# Patient Record
Sex: Female | Born: 1970 | Race: White | Hispanic: No | Marital: Married | Smoking: Former smoker
Health system: Southern US, Community
[De-identification: ages and names within clinical notes are randomized; demographics above are authoritative.]

## PROBLEM LIST (undated history)

## (undated) DIAGNOSIS — Z170 Estrogen receptor positive status [ER+]: Principal | ICD-10-CM

## (undated) DIAGNOSIS — F419 Anxiety disorder, unspecified: Secondary | ICD-10-CM

## (undated) DIAGNOSIS — I10 Essential (primary) hypertension: Secondary | ICD-10-CM

## (undated) DIAGNOSIS — K5790 Diverticulosis of intestine, part unspecified, without perforation or abscess without bleeding: Secondary | ICD-10-CM

## (undated) DIAGNOSIS — J329 Chronic sinusitis, unspecified: Secondary | ICD-10-CM

## (undated) DIAGNOSIS — C50412 Malignant neoplasm of upper-outer quadrant of left female breast: Secondary | ICD-10-CM

## (undated) HISTORY — DX: Diverticulosis of intestine, part unspecified, without perforation or abscess without bleeding: K57.90

## (undated) HISTORY — DX: Essential (primary) hypertension: I10

## (undated) HISTORY — DX: Estrogen receptor positive status (ER+): Z17.0

## (undated) HISTORY — DX: Chronic sinusitis, unspecified: J32.9

## (undated) HISTORY — PX: BREAST SURGERY: SHX581

## (undated) HISTORY — DX: Malignant neoplasm of upper-outer quadrant of left female breast: C50.412

## (undated) HISTORY — PX: DILATION AND CURETTAGE OF UTERUS: SHX78

## (undated) HISTORY — PX: LASIK: SHX215

---

## 2008-05-09 ENCOUNTER — Ambulatory Visit: Payer: Self-pay | Admitting: Family Medicine

## 2008-05-09 ENCOUNTER — Encounter: Payer: Self-pay | Admitting: Family Medicine

## 2008-05-09 ENCOUNTER — Other Ambulatory Visit: Admission: RE | Admit: 2008-05-09 | Discharge: 2008-05-09 | Payer: Self-pay | Admitting: Family Medicine

## 2008-05-09 DIAGNOSIS — J309 Allergic rhinitis, unspecified: Secondary | ICD-10-CM | POA: Insufficient documentation

## 2008-05-09 LAB — CONVERTED CEMR LAB: Pap Smear: NORMAL

## 2008-05-30 ENCOUNTER — Encounter: Payer: Self-pay | Admitting: Family Medicine

## 2008-05-31 LAB — CONVERTED CEMR LAB
BUN: 14 mg/dL (ref 6–23)
Chloride: 106 meq/L (ref 96–112)
Creatinine, Ser: 0.76 mg/dL (ref 0.40–1.20)
Glucose, Bld: 96 mg/dL (ref 70–99)
Potassium: 4.4 meq/L (ref 3.5–5.3)
Sodium: 140 meq/L (ref 135–145)
Total Bilirubin: 0.7 mg/dL (ref 0.3–1.2)
VLDL: 11 mg/dL (ref 0–40)

## 2009-01-17 ENCOUNTER — Encounter: Payer: Self-pay | Admitting: Family Medicine

## 2009-03-12 ENCOUNTER — Ambulatory Visit: Payer: Self-pay | Admitting: Family Medicine

## 2009-03-12 DIAGNOSIS — M79609 Pain in unspecified limb: Secondary | ICD-10-CM | POA: Insufficient documentation

## 2010-02-24 ENCOUNTER — Ambulatory Visit: Payer: Self-pay | Admitting: Family Medicine

## 2010-03-19 ENCOUNTER — Ambulatory Visit: Payer: Self-pay | Admitting: Family

## 2010-03-19 DIAGNOSIS — L02229 Furuncle of trunk, unspecified: Secondary | ICD-10-CM

## 2010-04-06 ENCOUNTER — Ambulatory Visit: Payer: Self-pay | Admitting: Family Medicine

## 2010-04-06 ENCOUNTER — Other Ambulatory Visit: Admission: RE | Admit: 2010-04-06 | Discharge: 2010-04-06 | Payer: Self-pay | Admitting: Family Medicine

## 2010-04-06 DIAGNOSIS — L219 Seborrheic dermatitis, unspecified: Secondary | ICD-10-CM | POA: Insufficient documentation

## 2010-04-06 DIAGNOSIS — R519 Headache, unspecified: Secondary | ICD-10-CM | POA: Insufficient documentation

## 2010-04-06 DIAGNOSIS — R51 Headache: Secondary | ICD-10-CM | POA: Insufficient documentation

## 2010-04-08 ENCOUNTER — Encounter: Admission: RE | Admit: 2010-04-08 | Discharge: 2010-04-08 | Payer: Self-pay | Admitting: Family Medicine

## 2010-04-14 ENCOUNTER — Encounter: Admission: RE | Admit: 2010-04-14 | Discharge: 2010-04-14 | Payer: Self-pay | Admitting: Family Medicine

## 2010-09-27 ENCOUNTER — Encounter: Payer: Self-pay | Admitting: Family Medicine

## 2010-10-06 NOTE — Assessment & Plan Note (Signed)
Summary: CPE   Vital Signs:  Patient profile:   40 year old female Height:      66.5 inches Weight:      190 pounds BMI:     30.32 Pulse rate:   60 / minute BP sitting:   106 / 68  (left arm) Cuff size:   regular  Vitals Entered By: Avon Gully CMA, Duncan Dull) (April 06, 2010 11:15 AM) CC: CPE and PAP   Primary Care Provider:  Nani Gasser MD  CC:  CPE and PAP.  History of Present Illness: Doing well overall. Runs several miles a day for exercise.  Gets s sensitivity over her forearms with running. usually goes away within an hour after stopping. No rash or redness. Wants to know if normal.  Also c/o of ear rining in the left ear. Has had ringin on and off over her lifetime but this has been louder andmore consistant over the last 2 months.  Happening weekely. Denies any hearing loss or ear pain.  Also notes has been getting HA over her left forehead for about 14month.  She describes thies as a pressure sensation and thought it could be from her sinuses and allergies.  No othe neurologic sxs like numbness, etc. No known triggers.   Used to get a cream for the skin on her nose and wants to know if I can rx something. No itching or burning. Comes and goes.   Current Medications (verified): 1)  Flonase 50 Mcg/act Susp (Fluticasone Propionate) .... 2 Sprays in Each Nostril Once Daily  Allergies (verified): 1)  ! Sulfa  Comments:  Nurse/Medical Assistant: The patient's medications and allergies were reviewed with the patient and were updated in the Medication and Allergy Lists. Avon Gully CMA, Duncan Dull) (April 06, 2010 11:16 AM)   Past History:  Past Medical History: Hx of corneal ulcer from wearing contacts Hx of genital warts.  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   Past Surgical History: D&C 1997 Lasik surgery.   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 Married to [place] with 3 kids.  Husband with hx of brain tumor.   Review of Systems  The patient denies anorexia, fever, weight loss, weight gain, vision loss, decreased hearing, hoarseness, chest pain, syncope, dyspnea on exertion, peripheral edema, prolonged cough, headaches, hemoptysis, abdominal pain, melena, hematochezia, severe indigestion/heartburn, hematuria, incontinence, genital sores, muscle weakness, suspicious skin lesions, transient blindness, difficulty walking, depression, unusual weight change, abnormal bleeding, enlarged lymph nodes, and breast masses.         Left ear is rining about one a week.   Physical Exam  General:  Well-developed,well-nourished,in no acute distress; alert,appropriate and cooperative throughout examination Head:  Normocephalic and atraumatic without obvious abnormalities. No apparent alopecia or balding. Eyes:  No corneal or conjunctival inflammation noted. EOMI. Perrla.  Ears:  Narrow ear canals. Scar on the left TM.  O/W clear.  Nose:  External nasal examination shows no deformity or inflammation.  Mouth:  Oral mucosa and oropharynx without lesions or exudates.  Teeth in good repair. Neck:  No deformities, masses, or tenderness noted. Chest Wall:  No deformities, masses, or tenderness noted. Breasts:  No mass, nodules, thickening, tenderness, bulging, retraction, inflamation, nipple discharge or skin changes noted.  Vertical ridge of breast  tissue above the nipple on the right breast.  Lungs:  Normal respiratory effort, chest expands symmetrically. Lungs are clear to auscultation, no crackles or wheezes. Heart:  Normal rate and regular rhythm. S1 and S2 normal without gallop, murmur, click, rub or other extra sounds. Abdomen:  Bowel sounds positive,abdomen soft and non-tender without masses, organomegaly or  hernias noted. Genitalia:  Normal introitus for age, no external lesions, no vaginal discharge, mucosa pink and moist, no vaginal or cervical lesions, no vaginal atrophy, no friaility or hemorrhage, normal uterus size and position, no adnexal masses or tenderness Pulses:  R and L carotid,radial,,dorsalis pedis and posterior tibial pulses are full and equal bilaterally Extremities:  No clubbing, cyanosis, edema, or deformity noted with normal full range of motion of all joints.   Neurologic:  No cranial nerve deficits noted. Station and gait are normal.  Sensory, motor and coordinative functions appear intact. Skin:  Dry flaking skin along the nasal fold.  Cervical Nodes:  No lymphadenopathy noted Axillary Nodes:  No palpable lymphadenopathy Psych:  Cognition and judgment appear intact. Alert and cooperative with normal attention span and concentration. No apparent delusions, illusions, hallucinations   Impression & Recommendations:  Problem # 1:  EXAMINATION, ROUTINE MEDICAL (ICD-V70.0) reminded her to get her mammogram Will f/u pap results. Due for screenig labs.  She exercises regularly.  Vaccines are up to date.   Problem # 2:  HEADACHE (ICD-784.0) Dsicussed that in combo with the ear ringing that this is concerning.  Dsicussed options. Agreed to follow for one month. If not better in one month will get head CT since her CN exam is normal ans she has not experienced any hering loss.  If worsens then f/u sooner and will scan sooner.   Problem # 3:  DERMATITIS, SEBORRHEIC (ICD-690.10)  Complete Medication List: 1)  Flonase 50 Mcg/act Susp (Fluticasone propionate) .... 2 sprays in each nostril once daily 2)  Triamcinolone Acetonide 0.1 % Crea (Triamcinolone acetonide) .... Apply once daily to affected area on face.  Patient Instructions: 1)  Call [phone] to schedule your mammogram.  2)  We will call you with your lab results.  3)  keep up the exercise!!   Prescriptions: TRIAMCINOLONE  ACETONIDE 0.1 % CREA (TRIAMCINOLONE ACETONIDE) Apply once daily to affected area on face.  #15 grams. x 0   Entered and Authorized by:   Nani Gasser MD   Signed by:   Nani Gasser MD on 04/06/2010   Method used:   Electronically to        [facility] [phone]* (retail)       [address]       [place], [place]  [address]       Ph: [phone] or [phone]       Fax: [phone]   RxID:   [id number]

## 2010-10-06 NOTE — Assessment & Plan Note (Signed)
Summary: ? MRSA at the bottom of stomach-jr   Vital Signs:  Patient profile:   40 year old female Height:      66.5 inches Weight:      186 pounds BMI:     29.68 Temp:     97.8 degrees F oral Pulse rate:   67 / minute BP sitting:   108 / 69  (left arm) Cuff size:   regular  Vitals Entered By: Kathlene November (March 19, 2010 1:33 PM) CC: sore on lower left side of pelvic area ? MRSA for 1 week   Primary Care Provider:  Nani Gasser MD  CC:  sore on lower left side of pelvic area ? MRSA for 1 week.  History of Present Illness: Shannon May is a 40 year old female who presents with a 10 week history of boil on the left lower abdomen.  She has tried using neosporin and applying bandages without improvement.  Reports that several years ago she had a MRSA boil on the back of her leg.  Denies associated drainage.  Current Medications (verified): 1)  None  Allergies (verified): 1)  ! Sulfa  Comments:  Nurse/Medical Assistant: The patient's medications and allergies were reviewed with the patient and were updated in the Medication and Allergy Lists. Kathlene November (March 19, 2010 1:33 PM)  Physical Exam  General:  Well-developed,well-nourished,in no acute distress; alert,appropriate and cooperative throughout examination Skin:  less then one centimeter lesion on left lower abdomen which is open, no significant erythema or drainage. Psych:  Cognition and judgment appear intact. Alert and cooperative with normal attention span and concentration. No apparent delusions, illusions, hallucinations   Impression & Recommendations:  Problem # 1:  CARBUNCLE AND FURUNCLE OF TRUNK (ICD-680.2) Assessment New Small lesion- clinically doubt MRSA.  Will give trial of Keflex.  Pt was instructed to call if symptoms worsen or do not improve- could consider doxy at that point.    Complete Medication List: 1)  Keflex 500 Mg Caps (Cephalexin) .... Two tablets by mouth two times a day x 7  days  Patient Instructions: 1)  Call if symptoms worsen or do not improve. 2)  Have a nice trip. Prescriptions: KEFLEX 500 MG CAPS (CEPHALEXIN) two tablets by mouth two times a day x 7 days  #14 x 0   Entered and Authorized by:   Lemont Fillers FNP   Signed by:   Lemont Fillers FNP on 03/19/2010   Method used:   Electronically to        [facility] [phone]* (retail)       [address]       [place], [place]  [address]       Ph: [phone] or [phone]       Fax: [phone]   RxID:   [id number]

## 2010-10-07 ENCOUNTER — Encounter: Payer: Self-pay | Admitting: Family Medicine

## 2010-10-21 ENCOUNTER — Encounter: Payer: Self-pay | Admitting: Family Medicine

## 2010-10-22 NOTE — Letter (Addendum)
Summary: [facility]   Imported By: Maryln Gottron 10/15/2010 10:38:49  _____________________________________________________________________  External Attachment:    Type:   Image     Comment:   External Document  Appended Document: [facility]   Preventive Care Screening  Colonoscopy:    Date:  10/21/2010    Next Due:  10/2015    Results:  normal Performed at [facility] [place]

## 2010-11-12 NOTE — Procedures (Signed)
Summary: Colonoscopy/[facility]  Colonoscopy/[facility]   Imported By: Lanelle Bal 11/05/2010 14:12:10  _____________________________________________________________________  External Attachment:    Type:   Image     Comment:   External Document

## 2012-08-09 ENCOUNTER — Encounter: Payer: Self-pay | Admitting: Physician Assistant

## 2012-08-09 ENCOUNTER — Ambulatory Visit (INDEPENDENT_AMBULATORY_CARE_PROVIDER_SITE_OTHER): Payer: BC Managed Care – PPO | Admitting: Physician Assistant

## 2012-08-09 VITALS — BP 109/67 | HR 75 | Temp 98.2°F | Ht 65.5 in | Wt 186.0 lb

## 2012-08-09 DIAGNOSIS — Z23 Encounter for immunization: Secondary | ICD-10-CM

## 2012-08-09 DIAGNOSIS — H8110 Benign paroxysmal vertigo, unspecified ear: Secondary | ICD-10-CM

## 2012-08-09 DIAGNOSIS — J309 Allergic rhinitis, unspecified: Secondary | ICD-10-CM

## 2012-08-09 DIAGNOSIS — R300 Dysuria: Secondary | ICD-10-CM

## 2012-08-09 DIAGNOSIS — N390 Urinary tract infection, site not specified: Secondary | ICD-10-CM

## 2012-08-09 DIAGNOSIS — R8290 Unspecified abnormal findings in urine: Secondary | ICD-10-CM

## 2012-08-09 LAB — POCT URINALYSIS DIPSTICK
Blood, UA: NEGATIVE
Nitrite, UA: POSITIVE
Spec Grav, UA: 1.02
Urobilinogen, UA: 4
pH, UA: 5

## 2012-08-09 MED ORDER — FLUTICASONE PROPIONATE 50 MCG/ACT NA SUSP: 2.0000 | Freq: Every day | NASAL | Status: DC

## 2012-08-09 MED ORDER — LORATADINE 10 MG PO TABS: 10.0000 mg | ORAL_TABLET | Freq: Every day | ORAL | Status: DC

## 2012-08-09 MED ORDER — NITROFURANTOIN MONOHYD MACRO 100 MG PO CAPS: ORAL_CAPSULE | ORAL | Status: DC

## 2012-08-09 MED ORDER — CIPROFLOXACIN HCL 500 MG PO TABS: 500.0000 mg | ORAL_TABLET | Freq: Two times a day (BID) | ORAL | Status: DC

## 2012-08-09 NOTE — Patient Instructions (Addendum)
Antivert for dizziness.   Urinary Tract Infection Urinary tract infections (UTIs) can develop anywhere along your urinary tract. Your urinary tract is your body's drainage system for removing wastes and extra water. Your urinary tract includes two kidneys, two ureters, a bladder, and a urethra. Your kidneys are a pair of bean-shaped organs. Each kidney is about the size of your fist. They are located below your ribs, one on each side of your spine. CAUSES Infections are caused by microbes, which are microscopic organisms, including fungi, viruses, and bacteria. These organisms are so small that they can only be seen through a microscope. Bacteria are the microbes that most commonly cause UTIs. SYMPTOMS  Symptoms of UTIs may vary by age and gender of the patient and by the location of the infection. Symptoms in young women typically include a frequent and intense urge to urinate and a painful, burning feeling in the bladder or urethra during urination. Older women and men are more likely to be tired, shaky, and weak and have muscle aches and abdominal pain. A fever may mean the infection is in your kidneys. Other symptoms of a kidney infection include pain in your back or sides below the ribs, nausea, and vomiting. DIAGNOSIS To diagnose a UTI, your caregiver will ask you about your symptoms. Your caregiver also will ask to provide a urine sample. The urine sample will be tested for bacteria and white blood cells. White blood cells are made by your body to help fight infection. TREATMENT  Typically, UTIs can be treated with medication. Because most UTIs are caused by a bacterial infection, they usually can be treated with the use of antibiotics. The choice of antibiotic and length of treatment depend on your symptoms and the type of bacteria causing your infection. HOME CARE INSTRUCTIONS  If you were prescribed antibiotics, take them exactly as your caregiver instructs you. Finish the medication even if  you feel better after you have only taken some of the medication.  Drink enough water and fluids to keep your urine clear or pale yellow.  Avoid caffeine, tea, and carbonated beverages. They tend to irritate your bladder.  Empty your bladder often. Avoid holding urine for long periods of time.  Empty your bladder before and after sexual intercourse.  After a bowel movement, women should cleanse from front to back. Use each tissue only once. SEEK MEDICAL CARE IF:   You have back pain.  You develop a fever.  Your symptoms do not begin to resolve within 3 days. SEEK IMMEDIATE MEDICAL CARE IF:   You have severe back pain or lower abdominal pain.  You develop chills.  You have nausea or vomiting.  You have continued burning or discomfort with urination. MAKE SURE YOU:   Understand these instructions.  Will watch your condition.  Will get help right away if you are not doing well or get worse. Document Released: 06/02/2005 Document Revised: 02/22/2012 Document Reviewed: 10/01/2011 [facility] Patient Information 2013 [place], [place].

## 2012-08-09 NOTE — Progress Notes (Signed)
Subjective:    Patient ID: Shannon May, female    DOB: 1970/10/11, 41 y.o.   MRN: [id number]  HPI Patient is a 41 yo female who presents to the clinic with dysuria, urinary frequency, and urgency starting 3 days ago. She has been really busy working and traveling and has not been able to get in. She has been taking Azo for the last 3 days and has helped. She has also been drinking water and cranberry juice. Despite all symptomatic care symptoms have worsened and she work up crying last night. She has also been taking ibuprofen for pain relief. Denies any fever but has felt warm. She has had some right sided back pain. Denies any noticeable blood in urine but does describe some mucus like discharge. She has had a history of recurrent UTI's and previously treated with macrobid post-coital. That did seem to help keep infections at bay. She has been evaluated by Urology and cannot find any structual reason for UTI's.   She has also had some problems with episodic dizziness. She had her first episode in November where she woke up she tried to stand and could not because she felt like the room was spinning. She has had 2 or 3 since then. They last no more than 2 hours. She has not tried anything to make better. Moving positions makes worse. She has not been taking Flonase and Claritin and she used to take everyday. She has a history of bad allergies.   Review of Systems     Objective:   Physical Exam  Constitutional: She is oriented to person, place, and time. She appears well-developed and well-nourished.  HENT:  Head: Normocephalic and atraumatic.  Right Ear: External ear normal.  Left Ear: External ear normal.  Mouth/Throat: Oropharynx is clear and moist. No oropharyngeal exudate.       TM's clear bilaterally.    Negative maxillary and frontal sinus tenderness.   Bilateral turbinates red and swollen.  Eyes: Conjunctivae normal are normal.  Neck: Normal range of motion. Neck supple.   Cardiovascular: Normal rate, regular rhythm and normal heart sounds.   Pulmonary/Chest: Effort normal and breath sounds normal.       NO CVA tenderness.  Abdominal: Soft. Bowel sounds are normal. There is no tenderness.  Lymphadenopathy:    She has no cervical adenopathy.  Neurological: She is alert and oriented to person, place, and time.  Skin: Skin is warm and dry.  Psychiatric: She has a normal mood and affect. Her behavior is normal.          Assessment & Plan:  UTI- UA was positive for nitrates, leukocytes, glucose, protein, ketones, and bilirubin. She had taken Azo which can mess up urine. Suggest UA recheck when sees Dr. Linford Arnold later this month to follow up on the more concerning positives or glucose and bilirubin in urine. Sent urine for culture. Went ahead and treated with Cipro for 3 days. Due to hx of recurrent UTI's and per patient this being the 4th one this year went ahead and put on post-coital Macrobid for prevention. Symptomatic care given for UTI. Call if not improving. Continuing azo for pain control.    BPV- Reassured pt that dizziness sounds like BPV. Instructed patient to go back on Claritin/Flonase regimen and to pick up Antivert OTC. See if that helps symptoms when she has episode. I was not able to reproduce symptoms with Dix-Hallipike today since asymptomatic. Discussed epley maneuver if symptoms last more than 2 hours.  Will follow up with scheduled appt with Dr. Linford Arnold in 2 weeks.    Flu shot given today with no complications.

## 2012-08-12 LAB — URINE CULTURE: Colony Count: >=100000

## 2012-09-01 ENCOUNTER — Encounter: Payer: Self-pay | Admitting: Family Medicine

## 2012-09-01 ENCOUNTER — Ambulatory Visit (INDEPENDENT_AMBULATORY_CARE_PROVIDER_SITE_OTHER): Payer: BC Managed Care – PPO | Admitting: Family Medicine

## 2012-09-01 ENCOUNTER — Other Ambulatory Visit: Payer: Self-pay | Admitting: Family Medicine

## 2012-09-01 ENCOUNTER — Other Ambulatory Visit (HOSPITAL_COMMUNITY)
Admission: RE | Admit: 2012-09-01 | Discharge: 2012-09-01 | Disposition: A | Discharge status: Home / Self Care | Payer: BC Managed Care – PPO | Source: Ambulatory Visit | Attending: Family Medicine | Admitting: Family Medicine

## 2012-09-01 VITALS — BP 107/65 | HR 64 | Resp 18 | Ht 66.75 in | Wt 185.0 lb

## 2012-09-01 DIAGNOSIS — Z01419 Encounter for gynecological examination (general) (routine) without abnormal findings: Secondary | ICD-10-CM | POA: Insufficient documentation

## 2012-09-01 DIAGNOSIS — J329 Chronic sinusitis, unspecified: Secondary | ICD-10-CM

## 2012-09-01 DIAGNOSIS — N920 Excessive and frequent menstruation with regular cycle: Secondary | ICD-10-CM

## 2012-09-01 DIAGNOSIS — M79609 Pain in unspecified limb: Secondary | ICD-10-CM

## 2012-09-01 DIAGNOSIS — F17200 Nicotine dependence, unspecified, uncomplicated: Secondary | ICD-10-CM

## 2012-09-01 DIAGNOSIS — M79673 Pain in unspecified foot: Secondary | ICD-10-CM

## 2012-09-01 DIAGNOSIS — Z0000 Encounter for general adult medical examination without abnormal findings: Secondary | ICD-10-CM

## 2012-09-01 MED ORDER — AMOXICILLIN 875 MG PO TABS: 875.0000 mg | ORAL_TABLET | Freq: Two times a day (BID) | ORAL | Status: DC

## 2012-09-01 NOTE — Patient Instructions (Addendum)
Keep up a regular exercise program and make sure you are eating a healthy diet Try to eat 4 servings of dairy a day, or if you are lactose intolerant take a calcium with vitamin D daily.  Your vaccines are up to date.   

## 2012-09-01 NOTE — Progress Notes (Signed)
Subjective:     Shannon May is a 41 y.o. female and is here for a comprehensive physical exam. The patient reports problems - bilateral heel pain in the morning. Gets better as the day progresses. never takes meds for it.  Used to run but says has backed offf and it has gotten better. She does not use medications for the pain or discomfort.  She also complains of sinus symptoms for 7 days. She said she had sinus congestion, cough with congestion in her chest. She has mild sore throat. She has significant ear pain and pressure. She said she felt so bad on Christmas day that she took some antibiotics that were offered by her mother. She says her with amoxicillin 500 mg. She has taken this for the last 2 days and says she is finally starting to feel better. She denies feeling febrile. She felt extremely fatigued. No significant myalgias. She says the cough and symptoms were worse at night.  She also complains of heavy periods over the last 2 years. She says they have been getting worse. They only last about 5 days but she's been passing very large clots that looked dark. She's also noticed spotting when she exercises which is also new for her. She denies any significant pelvic pain or pressure or discomfort.  History   Social History  . Marital Status: Married    Spouse Name: N/A    Number of Children: N/A  . Years of Education: N/A   Occupational History  . School Teacher    Social History Main Topics  . Smoking status: Current Some Day Smoker    Types: Cigarettes  . Smokeless tobacco: Not on file  . Alcohol Use: 1.8 oz/week    3 Glasses of wine per week  . Drug Use: Not on file  . Sexually Active: Yes -- Female partner(s)   Other Topics Concern  . Not on file   Social History Narrative   [employer] [employer] for exercise.  Used to run for exerise.    Health Maintenance  Topic Date Due  . Pap Smear  03/22/1987  . Influenza Vaccine  05/07/2013  . Tetanus/tdap  05/09/2018    The  following portions of the patient's history were reviewed and updated as appropriate: allergies, current medications, past family history, past medical history, past social history, past surgical history and problem list.  Review of Systems A comprehensive review of systems was negative.   Objective:    BP 107/65  Pulse 64  Resp 18  Ht 5' 6.75" (1.695 m)  Wt 185 lb (83.915 kg)  BMI 29.19 kg/m2  SpO2 98%  LMP 07/23/2012 General appearance: alert, cooperative and appears stated age Head: Normocephalic, without obvious abnormality, atraumatic Eyes: conj clear, EOMi, PEERLA Ears: normal TM's and external ear canals both ears Nose: Nares normal. Septum midline. Mucosa normal. No drainage or sinus tenderness. Throat: lips, mucosa, and tongue normal; teeth and gums normal Neck: no adenopathy, no carotid bruit, no JVD, supple, symmetrical, trachea midline and thyroid not enlarged, symmetric, no tenderness/mass/nodules Back: symmetric, no curvature. ROM normal. No CVA tenderness. Lungs: clear to auscultation bilaterally Breasts: normal appearance, no masses or tenderness Heart: regular rate and rhythm, S1, S2 normal, no murmur, click, rub or gallop Abdomen: soft, non-tender; bowel sounds normal; no masses,  no organomegaly Pelvic: cervix normal in appearance, external genitalia normal, no adnexal masses or tenderness, no cervical motion tenderness, rectovaginal septum normal, uterus normal size, shape, and consistency and vagina normal without  discharge Extremities: extremities normal, atraumatic, no cyanosis or edema Pulses: 2+ and symmetric Skin: Skin color, texture, turgor normal. No rashes or lesions Lymph nodes: Cervical, supraclavicular, and axillary nodes normal. Neurologic: Grossly normal    Assessment:    Healthy female exam.      Plan:     See After Visit Summary for Counseling Recommendations  Keep up a regular exercise program and make sure you are eating a healthy  diet Try to eat 4 servings of dairy a day, or if you are lactose intolerant take a calcium with vitamin D daily.  Your vaccines are up to date.  Go for screening labs.  Sinusitis - since she already started a course of antibiotics and it's difficult to tell if she really needs to have been on these from the beginning I will go ahead and send over a prescription so that she can finish the course that she has started. Sent amoxicillin 875 mg twice a day. Call if not significantly or completely better in one week.   Menorrhagia - discussed I would like to set her up for a pelvic ultrasound for further evaluation in addition to a endometrial biopsy with GYN since she is over the age of 43 and this is a new finding for her.  Check thyroid to rule out hormonal abnormality  Bilateral heel pain-I suspect plantar fasciitis or possible heel spur based on the description of her symptoms. Handout given her exercises to do on her own at home. After couple weeks of the knees I encouraged her to start running again and slowly work her way up. If she is still having significant pain or discomfort then I recommend she followup with my partner Dr. Rodney Langton for possible injections and maybe night splints. Avoid wearing shoes like high heels. Make sure that they are supportive.

## 2012-09-01 NOTE — Addendum Note (Signed)
Addended by: Chalmers Cater on: 09/01/2012 01:27 PM   Modules accepted: Orders

## 2012-09-04 ENCOUNTER — Ambulatory Visit (HOSPITAL_BASED_OUTPATIENT_CLINIC_OR_DEPARTMENT_OTHER)
Admission: RE | Admit: 2012-09-04 | Discharge: 2012-09-04 | Disposition: A | Discharge status: Home / Self Care | Payer: BC Managed Care – PPO | Source: Ambulatory Visit | Attending: Family Medicine | Admitting: Family Medicine

## 2012-09-04 DIAGNOSIS — N920 Excessive and frequent menstruation with regular cycle: Secondary | ICD-10-CM | POA: Insufficient documentation

## 2012-09-04 DIAGNOSIS — F17200 Nicotine dependence, unspecified, uncomplicated: Secondary | ICD-10-CM

## 2012-09-04 LAB — COMPLETE METABOLIC PANEL WITH GFR
ALT: 13 U/L (ref 0–35)
Albumin: 4.3 g/dL (ref 3.5–5.2)
Creat: 0.61 mg/dL (ref 0.50–1.10)
GFR, Est Non African American: >89 mL/min
Glucose, Bld: 90 mg/dL (ref 70–99)
Sodium: 137 mEq/L (ref 135–145)
Total Bilirubin: 0.5 mg/dL (ref 0.3–1.2)

## 2012-09-04 LAB — CBC WITH DIFFERENTIAL/PLATELET
Basophils Absolute: 0 10*3/uL (ref 0.0–0.1)
HCT: 39.7 % (ref 36.0–46.0)
Hemoglobin: 13.8 g/dL (ref 12.0–15.0)
Lymphs Abs: 1.8 10*3/uL (ref 0.7–4.0)
MCHC: 34.8 g/dL (ref 30.0–36.0)
Neutrophils Relative %: 62 % (ref 43–77)
Platelets: 301 10*3/uL (ref 150–400)
RBC: 4.3 MIL/uL (ref 3.87–5.11)
RDW: 13 % (ref 11.5–15.5)
WBC: 6.5 10*3/uL (ref 4.0–10.5)

## 2012-09-04 LAB — LIPID PANEL
Total CHOL/HDL Ratio: 3.5 Ratio
Triglycerides: 60 mg/dL (ref <150)

## 2012-09-04 LAB — TSH: TSH: 2.642 u[IU]/mL (ref 0.350–4.500)

## 2012-09-07 ENCOUNTER — Other Ambulatory Visit: Payer: BC Managed Care – PPO

## 2012-09-08 ENCOUNTER — Encounter: Payer: Self-pay | Admitting: Family Medicine

## 2012-11-30 ENCOUNTER — Encounter: Payer: Self-pay | Admitting: Sports Medicine

## 2012-11-30 ENCOUNTER — Ambulatory Visit (INDEPENDENT_AMBULATORY_CARE_PROVIDER_SITE_OTHER): Payer: BC Managed Care – PPO | Admitting: Sports Medicine

## 2012-11-30 VITALS — BP 124/66 | HR 76 | Wt 190.0 lb

## 2012-11-30 DIAGNOSIS — M75100 Unspecified rotator cuff tear or rupture of unspecified shoulder, not specified as traumatic: Secondary | ICD-10-CM

## 2012-11-30 DIAGNOSIS — B070 Plantar wart: Secondary | ICD-10-CM

## 2012-11-30 DIAGNOSIS — M7552 Bursitis of left shoulder: Secondary | ICD-10-CM

## 2012-11-30 DIAGNOSIS — IMO0002 Reserved for concepts with insufficient information to code with codable children: Secondary | ICD-10-CM

## 2012-11-30 DIAGNOSIS — M7550 Bursitis of unspecified shoulder: Secondary | ICD-10-CM | POA: Insufficient documentation

## 2012-11-30 MED ORDER — MELOXICAM 15 MG PO TABS: ORAL_TABLET | ORAL | Status: DC

## 2012-11-30 NOTE — Assessment & Plan Note (Signed)
Cryotherapy performed. We may need to repeat this.

## 2012-11-30 NOTE — Assessment & Plan Note (Signed)
Ultrasound guided injection as above. Mobic. Home exercises. Return in 4 weeks.

## 2012-11-30 NOTE — Progress Notes (Signed)
   Subjective:    CC: Shoulder and foot  HPI: Left foot: This young lady has noted a nodule on the lateral aspect of her fourth toe. She denies any trauma and notes that it has been there for several weeks. It is not painful, she is going on vacation soon and needed to be done.  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 Pain is localized, doesn't radiate, moderate. No trauma.  Past medical history, Surgical history, Family history not pertinant except as noted below, Social history, Allergies, and medications have been entered into the medical record, reviewed, and no changes needed.   Review of Systems: No headache, visual changes, nausea, vomiting, diarrhea, constipation, dizziness, abdominal pain, skin rash, fevers, chills, night sweats, weight loss, swollen lymph nodes, body aches, joint swelling, muscle aches, chest pain, shortness of breath, mood changes, visual or auditory hallucinations.   Objective:   General: Well Developed, well nourished, and in no acute distress.  Neuro/Psych: Alert and oriented x3, extra-ocular muscles intact, able to move all 4 extremities, sensation grossly intact. Skin: Warm and dry, no rashes noted.  1 cm verruca versus callous noted on lateral aspect of fourth toe on the left foot. Respiratory: Not using accessory muscles, speaking in full sentences, trachea midline.  Cardiovascular: Pulses palpable, no extremity edema. Abdomen: Does not appear distended. Left Shoulder: Inspection reveals no abnormalities, atrophy or asymmetry. Palpation is normal with no tenderness over AC joint or bicipital groove. ROM is limited in abduction. Rotator cuff strength weak to abduction. Positive  Neer and Hawkin's tests, empty can sign. Speeds and Yergason's tests normal. No labral pathology noted with negative Obrien's, negative clunk and good stability. Normal scapular function observed. No painful arc and no drop arm sign. No apprehension sign  Procedure: Real-time Ultrasound Guided Injection of left subacromial bursa Device: GE Logiq E  Ultrasound guided injection is preferred based studies that show increased duration, increased effect, greater accuracy, decreased procedural pain, increased response rate, and decreased cost with ultrasound guided versus blind injection.  Verbal informed consent obtained.  Time-out conducted.  Noted no overlying erythema, induration, or other signs of local infection.  Skin prepped in a sterile fashion.  Local anesthesia: Topical Ethyl chloride.  With sterile technique and under real time ultrasound guidance:  25-gauge 1/2 inch needle advanced into subacromial bursa, 1 cc Kenalog 40, 4 cc lidocaine injected easily. Supraspinatus appeared intact. Completed without difficulty  Pain immediately resolved suggesting accurate placement of the medication.  Advised to call if fevers/chills, erythema, induration, drainage, or persistent bleeding.  Images permanently stored and available for review in the ultrasound unit.  Impression: Technically successful ultrasound guided injection.  Cryotherapy performed on verruca on foot.  Impression and Recommendations:   This case required medical decision making of moderate complexity.

## 2012-12-12 ENCOUNTER — Ambulatory Visit (INDEPENDENT_AMBULATORY_CARE_PROVIDER_SITE_OTHER): Payer: BC Managed Care – PPO | Admitting: Family Medicine

## 2012-12-12 ENCOUNTER — Encounter: Payer: Self-pay | Admitting: Family Medicine

## 2012-12-12 VITALS — BP 123/79 | HR 64 | Ht 66.75 in | Wt 186.0 lb

## 2012-12-12 DIAGNOSIS — B070 Plantar wart: Secondary | ICD-10-CM

## 2012-12-12 NOTE — Progress Notes (Signed)
CC: Shannon May is a 42 y.o. female is here for wart on toe   Subjective: HPI:  Patient complains of left fourth toe pain. Described as a sharp sensation the lateral edge of the toe. Localized to a region where callus has been forming for months now. Symptoms were slightly improved after cryotherapy approximately 2 weeks ago. Pain is described as mild. Nothing else is made better or worse other than walking on it seems to be more annoying. No interventions other than above. Denies foot pain otherwise, joint swelling nor redness, trouble bearing weight on the left foot, fevers, chills, nor swollen lymph nodes   Review Of Systems Outlined In HPI  No past medical history on file.   Family History  Problem Relation Age of Onset  . Colon cancer Maternal Grandfather   . Melanoma Paternal Grandfather   . Heart attack Maternal Grandmother   . Hypertension Mother   . Hyperlipidemia Father      History  Substance Use Topics  . Smoking status: Current Some Day Smoker    Types: Cigarettes  . Smokeless tobacco: Not on file  . Alcohol Use: 1.8 oz/week    3 Glasses of wine per week     Objective: Filed Vitals:   12/12/12 1135  BP: 123/79  Pulse: 64    Vital signs reviewed. General: Alert and Oriented, No Acute Distress HEENT: Pupils equal, round, reactive to light. Conjunctivae clear.  External ears unremarkable.  Moist mucous membranes. Lungs: Clear and comfortable work of breathing, speaking in full sentences without accessory muscle use. Cardiac: Regular rate and rhythm.  Neuro: CN II-XII grossly intact, gait normal. Extremities: No peripheral edema.  Strong peripheral pulses. On the left fourth lateral digit of the foot there is a well healed and callused over wart with central punctate bruising all of which approximately 5 mm in diameter. Mental Status: No depression, anxiety, nor agitation. Logical though process. Skin: Warm and dry.  Assessment & Plan: Shannon May was seen  today for wart on toe.  Diagnoses and associated orders for this visit:  Verruca pedis    Discuss continued management of her wart, she would prefer repeat cryotherapy. The callus formation over the wart was easily shaved off with a 10 blade by myself to aid with freezing.  Cryotherapy Procedure Note  Pre-operative Diagnosis: verruca pedis  Post-operative Diagnosis: same  Locations: left foot lateral 4th digit  Indications: pain  Anesthesia: none  Procedure Details  History of allergy to iodine: no. Pacemaker? no.  Patient informed of risks (permanent scarring, infection, light or dark discoloration, bleeding, infection, weakness, numbness and recurrence of the lesion) and benefits of the procedure and verbal informed consent obtained.  The areas are treated with liquid nitrogen therapy, frozen until ice ball extended 3 mm beyond lesion, allowed to thaw, and treated again. The patient tolerated procedure well.  The patient was instructed on post-op care, warned that there may be blister formation, redness and pain. Recommend OTC analgesia as needed for pain.  Condition: Stable  Complications: none.  Plan: 1. Instructed to keep the area dry and covered for 24-48h and clean thereafter. 2. Warning signs of infection were reviewed.   3. Recommended that the patient use OTC analgesics as needed for pain.     Return if symptoms worsen or fail to improve.

## 2013-01-01 ENCOUNTER — Ambulatory Visit (INDEPENDENT_AMBULATORY_CARE_PROVIDER_SITE_OTHER): Payer: BC Managed Care – PPO | Admitting: Sports Medicine

## 2013-01-01 VITALS — BP 112/74 | HR 65

## 2013-01-01 DIAGNOSIS — M7552 Bursitis of left shoulder: Secondary | ICD-10-CM

## 2013-01-01 DIAGNOSIS — IMO0002 Reserved for concepts with insufficient information to code with codable children: Secondary | ICD-10-CM

## 2013-01-01 DIAGNOSIS — M75100 Unspecified rotator cuff tear or rupture of unspecified shoulder, not specified as traumatic: Secondary | ICD-10-CM

## 2013-01-01 NOTE — Progress Notes (Signed)
  Subjective:    CC: Followup  HPI: Rotator cuff syndrome: Resolved after injection, she was able to conduct the orchestra perfectly.  Toe lesion: Resolved after a couple of cryotherapy sessions.  Past medical history, Surgical history, Family history not pertinant except as noted below, Social history, Allergies, and medications have been entered into the medical record, reviewed, and no changes needed.   Review of Systems: No fevers, chills, night sweats, weight loss, chest pain, or shortness of breath.   Objective:    General: Well Developed, well nourished, and in no acute distress.  Neuro: Alert and oriented x3, extra-ocular muscles intact, sensation grossly intact.  HEENT: Normocephalic, atraumatic, pupils equal round reactive to light, neck supple, no masses, no lymphadenopathy, thyroid nonpalpable.  Skin: Warm and dry, no rashes. Cardiac: Regular rate and rhythm, no murmurs rubs or gallops, no lower extremity edema.  Respiratory: Clear to auscultation bilaterally. Not using accessory muscles, speaking in full sentences. Left Shoulder: Inspection reveals no abnormalities, atrophy or asymmetry. Palpation is normal with no tenderness over AC joint or bicipital groove. ROM is full in all planes. Rotator cuff strength normal throughout. No signs of impingement with negative Neer and Hawkin's tests, empty can sign. Speeds and Yergason's tests normal. No labral pathology noted with negative Obrien's, negative clunk and good stability. Normal scapular function observed. No painful arc and no drop arm sign. No apprehension sign  Impression and Recommendations:

## 2013-01-01 NOTE — Assessment & Plan Note (Signed)
Resolved, continue exercises.

## 2013-06-20 ENCOUNTER — Encounter: Payer: Self-pay | Admitting: Physician Assistant

## 2013-06-20 ENCOUNTER — Ambulatory Visit (INDEPENDENT_AMBULATORY_CARE_PROVIDER_SITE_OTHER): Payer: BC Managed Care – PPO | Admitting: Physician Assistant

## 2013-06-20 VITALS — BP 131/89 | HR 66 | Wt 197.0 lb

## 2013-06-20 DIAGNOSIS — R8290 Unspecified abnormal findings in urine: Secondary | ICD-10-CM

## 2013-06-20 DIAGNOSIS — R3915 Urgency of urination: Secondary | ICD-10-CM

## 2013-06-20 DIAGNOSIS — R82998 Other abnormal findings in urine: Secondary | ICD-10-CM

## 2013-06-20 DIAGNOSIS — N390 Urinary tract infection, site not specified: Secondary | ICD-10-CM

## 2013-06-20 LAB — POCT URINALYSIS DIPSTICK
Glucose, UA: NEGATIVE
Ketones, UA: NEGATIVE
Nitrite, UA: POSITIVE
Spec Grav, UA: <=1.005

## 2013-06-20 MED ORDER — CIPROFLOXACIN HCL 500 MG PO TABS: 500.0000 mg | ORAL_TABLET | Freq: Two times a day (BID) | ORAL | Status: DC

## 2013-06-20 NOTE — Patient Instructions (Signed)
Urinary Tract Infection  Urinary tract infections (UTIs) can develop anywhere along your urinary tract. Your urinary tract is your body's drainage system for removing wastes and extra water. Your urinary tract includes two kidneys, two ureters, a bladder, and a urethra. Your kidneys are a pair of bean-shaped organs. Each kidney is about the size of your fist. They are located below your ribs, one on each side of your spine.  CAUSES  Infections are caused by microbes, which are microscopic organisms, including fungi, viruses, and bacteria. These organisms are so small that they can only be seen through a microscope. Bacteria are the microbes that most commonly cause UTIs.  SYMPTOMS   Symptoms of UTIs may vary by age and gender of the patient and by the location of the infection. Symptoms in young women typically include a frequent and intense urge to urinate and a painful, burning feeling in the bladder or urethra during urination. Older women and men are more likely to be tired, shaky, and weak and have muscle aches and abdominal pain. A fever may mean the infection is in your kidneys. Other symptoms of a kidney infection include pain in your back or sides below the ribs, nausea, and vomiting.  DIAGNOSIS  To diagnose a UTI, your caregiver will ask you about your symptoms. Your caregiver also will ask to provide a urine sample. The urine sample will be tested for bacteria and white blood cells. White blood cells are made by your body to help fight infection.  TREATMENT   Typically, UTIs can be treated with medication. Because most UTIs are caused by a bacterial infection, they usually can be treated with the use of antibiotics. The choice of antibiotic and length of treatment depend on your symptoms and the type of bacteria causing your infection.  HOME CARE INSTRUCTIONS   If you were prescribed antibiotics, take them exactly as your caregiver instructs you. Finish the medication even if you feel better after you  have only taken some of the medication.   Drink enough water and fluids to keep your urine clear or pale yellow.   Avoid caffeine, tea, and carbonated beverages. They tend to irritate your bladder.   Empty your bladder often. Avoid holding urine for long periods of time.   Empty your bladder before and after sexual intercourse.   After a bowel movement, women should cleanse from front to back. Use each tissue only once.  SEEK MEDICAL CARE IF:    You have back pain.   You develop a fever.   Your symptoms do not begin to resolve within 3 days.  SEEK IMMEDIATE MEDICAL CARE IF:    You have severe back pain or lower abdominal pain.   You develop chills.   You have nausea or vomiting.   You have continued burning or discomfort with urination.  MAKE SURE YOU:    Understand these instructions.   Will watch your condition.   Will get help right away if you are not doing well or get worse.  Document Released: 06/02/2005 Document Revised: 02/22/2012 Document Reviewed: 10/01/2011  [employer] Patient Information 2014 [employer].

## 2013-06-20 NOTE — Progress Notes (Signed)
  Subjective:    Patient ID: Shannon May, female    DOB: 1970-11-17, 42 y.o.   MRN: [id number]  HPI Patient is a 42 year old female who presents to the clinic with urinary urgency, urinary frequency and abdominal pressure. Her urine has had a bad odor for the last 4 days. She has been taking AZO which has helped significantly. She denies any fever but does feel a little sweaty and clammy. She admits to not taking her Macrobid after sexual intercourse for the last month.     Review of Systems     Objective:   Physical Exam  Constitutional: She is oriented to person, place, and time. She appears well-developed and well-nourished.  HENT:  Head: Normocephalic and atraumatic.  Cardiovascular: Normal rate, regular rhythm and normal heart sounds.   Pulmonary/Chest: Effort normal and breath sounds normal. She has no wheezes.  No CVA tenderness  Abdominal: Soft. There is no tenderness.  Neurological: She is alert and oriented to person, place, and time.  Skin: Skin is warm and dry.  Psychiatric: She has a normal mood and affect. Her behavior is normal.          Assessment & Plan:  UTI-UA positive for blood, leukocytes, nitrates. Cipro was given for the next 3 days. Handout for symptomatic control was given. If symptoms suddenly worsen or do not improve call office.

## 2013-07-24 ENCOUNTER — Encounter: Payer: Self-pay | Admitting: Sports Medicine

## 2013-07-24 ENCOUNTER — Ambulatory Visit (INDEPENDENT_AMBULATORY_CARE_PROVIDER_SITE_OTHER): Payer: BC Managed Care – PPO | Admitting: Sports Medicine

## 2013-07-24 VITALS — BP 121/71 | HR 67 | Wt 198.0 lb

## 2013-07-24 DIAGNOSIS — M7552 Bursitis of left shoulder: Secondary | ICD-10-CM

## 2013-07-24 DIAGNOSIS — IMO0002 Reserved for concepts with insufficient information to code with codable children: Secondary | ICD-10-CM

## 2013-07-24 DIAGNOSIS — M75100 Unspecified rotator cuff tear or rupture of unspecified shoulder, not specified as traumatic: Secondary | ICD-10-CM

## 2013-07-24 NOTE — Assessment & Plan Note (Signed)
Excellent response to initial subacromial injection. Injection repeated today. Return as needed.

## 2013-07-24 NOTE — Progress Notes (Signed)
  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 Last subacromial injection was in March, she had a six-month response, now has recurrent pain over the deltoid, worse with overhead activities, radiation, moderate, persistent.  Past medical history, Surgical history, Family history not pertinant except as noted below, Social history, Allergies, and medications have been entered into the medical record, reviewed, and no changes needed.   Review of Systems: No fevers, chills, night sweats, weight loss, chest pain, or shortness of breath.   Objective:    General: Well Developed, well nourished, and in no acute distress.  Neuro: Alert and oriented x3, extra-ocular muscles intact, sensation grossly intact.  HEENT: Normocephalic, atraumatic, pupils equal round reactive to light, neck supple, no masses, no lymphadenopathy, thyroid nonpalpable.  Skin: Warm and dry, no rashes. Cardiac: Regular rate and rhythm, no murmurs rubs or gallops, no lower extremity edema.  Respiratory: Clear to auscultation bilaterally. Not using accessory muscles, speaking in full sentences. Left Shoulder: Inspection reveals no abnormalities, atrophy or asymmetry. Palpation is normal with no tenderness over AC joint or bicipital groove. ROM is full in all planes. Rotator cuff strength normal throughout. Positive Neer and Hawkin's tests, empty can sign. Speeds and Yergason's tests normal. No labral pathology noted with negative Obrien's, negative clunk and good stability. Normal scapular function observed. No painful arc and no drop arm sign. No apprehension sign  Procedure: Real-time Ultrasound Guided Injection of left subacromial bursa Device: GE Logiq E  Verbal informed consent obtained.  Time-out conducted.  Noted no overlying erythema, induration, or other signs of local infection.  Skin prepped in a sterile fashion.  Local anesthesia:  Topical Ethyl chloride.  With sterile technique and under real time ultrasound guidance:  25-gauge needle advanced into the bursa, 1 cc Kenalog 40, 3 cc lidocaine injected easily. Completed without difficulty  Pain immediately resolved suggesting accurate placement of the medication.  Advised to call if fevers/chills, erythema, induration, drainage, or persistent bleeding.  Images permanently stored and available for review in the ultrasound unit.  Impression: Technically successful ultrasound guided injection.  Impression and Recommendations:

## 2013-10-15 ENCOUNTER — Ambulatory Visit (INDEPENDENT_AMBULATORY_CARE_PROVIDER_SITE_OTHER): Payer: BC Managed Care – PPO | Admitting: Physician Assistant

## 2013-10-15 ENCOUNTER — Encounter: Payer: Self-pay | Admitting: Physician Assistant

## 2013-10-15 VITALS — BP 146/76 | HR 73 | Temp 97.9°F | Wt 204.0 lb

## 2013-10-15 DIAGNOSIS — J029 Acute pharyngitis, unspecified: Secondary | ICD-10-CM

## 2013-10-15 DIAGNOSIS — R509 Fever, unspecified: Secondary | ICD-10-CM

## 2013-10-15 LAB — POCT RAPID STREP A (OFFICE): RAPID STREP A SCREEN: NEGATIVE

## 2013-10-15 MED ORDER — METHYLPREDNISOLONE (PAK) 4 MG PO TABS
4.0000 mg | ORAL_TABLET | Freq: Every day | ORAL | Status: DC
Start: 2013-10-15 — End: 2014-01-23

## 2013-10-15 NOTE — Patient Instructions (Signed)
Sore Throat A sore throat is pain, burning, irritation, or scratchiness of the throat. There is often pain or tenderness when swallowing or talking. A sore throat may be accompanied by other symptoms, such as coughing, sneezing, fever, and swollen neck glands. A sore throat is often the first sign of another sickness, such as a cold, flu, strep throat, or mononucleosis (commonly known as mono). Most sore throats go away without medical treatment. CAUSES  The most common causes of a sore throat include:  A viral infection, such as a cold, flu, or mono.  A bacterial infection, such as strep throat, tonsillitis, or whooping cough.  Seasonal allergies.  Dryness in the air.  Irritants, such as smoke or pollution.  Gastroesophageal reflux disease (GERD). HOME CARE INSTRUCTIONS   Only take over-the-counter medicines as directed by your caregiver.  Drink enough fluids to keep your urine clear or pale yellow.  Rest as needed.  Try using throat sprays, lozenges, or sucking on hard candy to ease any pain (if older than 4 years or as directed).  Sip warm liquids, such as broth, herbal tea, or warm water with honey to relieve pain temporarily. You may also eat or drink cold or frozen liquids such as frozen ice pops.  Gargle with salt water (mix 1 tsp salt with 8 oz of water).  Do not smoke and avoid secondhand smoke.  Put a cool-mist humidifier in your bedroom at night to moisten the air. You can also turn on a hot shower and sit in the bathroom with the door closed for 5 10 minutes. SEEK IMMEDIATE MEDICAL CARE IF:  You have difficulty breathing.  You are unable to swallow fluids, soft foods, or your saliva.  You have increased swelling in the throat.  Your sore throat does not get better in 7 days.  You have nausea and vomiting.  You have a fever or persistent symptoms for more than 2 3 days.  You have a fever and your symptoms suddenly get worse. MAKE SURE YOU:   Understand  these instructions.  Will watch your condition.  Will get help right away if you are not doing well or get worse. Document Released: 09/30/2004 Document Revised: 08/09/2012 Document Reviewed: 04/30/2012 [employer] Patient Information 2014 [employer].  

## 2013-10-15 NOTE — Progress Notes (Signed)
   Subjective:    Patient ID: Shannon May, female    DOB: 07/30/1971, 43 y.o.   MRN: [id number]  HPI Pt presents to the clinic with 4 days of fever, cough, body aches and ST. She never took her fever just felt hot and cold off and on. 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 Her lungs feel like they are burning but denies any wheezing. Some coughing with production of clear sputum. ST is raw. Denies any nausea or vomiting. Tried dayquil, ibuprofen, mucinex with some relief.  No one else in the home is sick.    Review of Systems     Objective:   Physical Exam  Constitutional: She is oriented to person, place, and time. She appears well-developed and well-nourished.  HENT:  Head: Normocephalic and atraumatic.  Right Ear: External ear normal.  Left Ear: External ear normal.  Nose: Nose normal.  TM"s clear bilaterally.   Oropharynx erythematous. No exudate or swollen tonsils.   No maxillary or frontal sinus tenderness to palpation.   Eyes: Conjunctivae are normal.  Neck: Normal range of motion. Neck supple.  Cardiovascular: Normal rate, regular rhythm and normal heart sounds.   Pulmonary/Chest: Effort normal and breath sounds normal. She has no wheezes.  Lymphadenopathy:    She has no cervical adenopathy.  Neurological: She is alert and oriented to person, place, and time.  Skin: Skin is dry.  Psychiatric: She has a normal mood and affect. Her behavior is normal.          Assessment & Plan:  Acute pharyngitis- Rapid strep negative. Influnenza negative.  Discussed likely viral with short duration and negative strep. Will send over medrol dose pack to help with inflammation and symptoms. Gave HO. Discussed symptomatic control. 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 Call if not improving or worsening.

## 2013-10-18 ENCOUNTER — Telehealth: Payer: Self-pay | Admitting: *Deleted

## 2013-10-18 NOTE — Telephone Encounter (Signed)
Pt left vm stating that she has now begun to cough up really thick mucous & thinks she may need abx now.

## 2013-10-19 ENCOUNTER — Other Ambulatory Visit: Payer: Self-pay | Admitting: Physician Assistant

## 2013-10-19 MED ORDER — AZITHROMYCIN 250 MG PO TABS: ORAL_TABLET | ORAL | Status: DC

## 2013-10-19 NOTE — Telephone Encounter (Signed)
Pt notified of rx. 

## 2013-10-19 NOTE — Telephone Encounter (Signed)
Sent zpak to [facility].

## 2013-11-06 ENCOUNTER — Ambulatory Visit (INDEPENDENT_AMBULATORY_CARE_PROVIDER_SITE_OTHER): Payer: BC Managed Care – PPO | Admitting: Sports Medicine

## 2013-11-06 ENCOUNTER — Encounter: Payer: Self-pay | Admitting: Sports Medicine

## 2013-11-06 VITALS — BP 134/86 | HR 85 | Ht 67.0 in | Wt 206.0 lb

## 2013-11-06 DIAGNOSIS — IMO0002 Reserved for concepts with insufficient information to code with codable children: Secondary | ICD-10-CM

## 2013-11-06 DIAGNOSIS — M75100 Unspecified rotator cuff tear or rupture of unspecified shoulder, not specified as traumatic: Secondary | ICD-10-CM

## 2013-11-06 DIAGNOSIS — M7550 Bursitis of unspecified shoulder: Secondary | ICD-10-CM

## 2013-11-06 NOTE — Assessment & Plan Note (Addendum)
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 Return as needed.

## 2013-11-06 NOTE — Progress Notes (Addendum)
  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 Pain is starting to recur, and she has a large choir to conduct coming up. Mild, persistent, localized over left deltoid, worse with overhead activities.  Past medical history, Surgical history, Family history not pertinant except as noted below, Social history, Allergies, and medications have been entered into the medical record, reviewed, and no changes needed.   Review of Systems: No fevers, chills, night sweats, weight loss, chest pain, or shortness of breath.   Objective:    General: Well Developed, well nourished, and in no acute distress.  Neuro: Alert and oriented x3, extra-ocular muscles intact, sensation grossly intact.  HEENT: Normocephalic, atraumatic, pupils equal round reactive to light, neck supple, no masses, no lymphadenopathy, thyroid nonpalpable.  Skin: Warm and dry, no rashes. Cardiac: Regular rate and rhythm, no murmurs rubs or gallops, no lower extremity edema.  Respiratory: Clear to auscultation bilaterally. Not using accessory muscles, speaking in full sentences. Left Shoulder: Inspection reveals no abnormalities, atrophy or asymmetry. Palpation is normal with no tenderness over AC joint or bicipital groove. ROM is full in all planes. Rotator cuff strength normal throughout. Minimally positive impingement signs. Speeds and Yergason's tests normal. No labral pathology noted with negative Obrien's, negative clunk and good stability. Normal scapular function observed. No painful arc and no drop arm sign. No apprehension sign  Procedure: Real-time Ultrasound Guided Injection of left subacromial bursa Device: GE Logiq E  Verbal informed consent obtained.  Time-out conducted.  Noted no overlying erythema, induration, or other signs of local infection.    Skin prepped in a sterile fashion.  Local anesthesia: Topical Ethyl chloride.  With sterile technique and under real time ultrasound guidance:  0.5 cc Kenalog 40, 4 cc lidocaine injected easily into the cervical bursa. Completed without difficulty  Pain immediately resolved suggesting accurate placement of the medication.  Advised to call if fevers/chills, erythema, induration, drainage, or persistent bleeding.  Images permanently stored and available for review in the ultrasound unit.  Impression: Technically successful ultrasound guided injection.  Impression and Recommendations:

## 2014-01-23 ENCOUNTER — Encounter: Payer: Self-pay | Admitting: Physician Assistant

## 2014-01-23 ENCOUNTER — Telehealth: Payer: Self-pay

## 2014-01-23 ENCOUNTER — Ambulatory Visit (INDEPENDENT_AMBULATORY_CARE_PROVIDER_SITE_OTHER): Payer: BC Managed Care – PPO | Admitting: Physician Assistant

## 2014-01-23 VITALS — BP 126/82 | HR 79 | Ht 67.0 in | Wt 205.0 lb

## 2014-01-23 DIAGNOSIS — M75100 Unspecified rotator cuff tear or rupture of unspecified shoulder, not specified as traumatic: Secondary | ICD-10-CM

## 2014-01-23 DIAGNOSIS — IMO0002 Reserved for concepts with insufficient information to code with codable children: Secondary | ICD-10-CM

## 2014-01-23 DIAGNOSIS — M7550 Bursitis of unspecified shoulder: Secondary | ICD-10-CM

## 2014-01-23 NOTE — Telephone Encounter (Signed)
Shannon May called to report shoulder pain has returned. I have scheduled patient for office visit.

## 2014-01-23 NOTE — Progress Notes (Signed)
   Subjective:    Patient ID: Shannon May, female    DOB: [birth year], 43 y.o.   MRN: [id number]  HPI Pt presents to the clinic with left shoulder pain from diagnosed bursitis. Dr. Darene Lamer is currently treating pt. Last injection was in march and help for 2 months significantly but about 1 month ago pain started back. 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 mobic helps but not been taking everyday due to worry about hurting her kidneys. 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 Most movement hurts arm. Her ROM is limited due to pain. She would like to be written out for a period of rest for next couple of days. If she works there is just no way she cannot use her arms.    Review of Systems     Objective:   Physical Exam  Constitutional: She appears well-developed and well-nourished.  Cardiovascular: Normal rate, regular rhythm and normal heart sounds.   Musculoskeletal:  Not able to abduct left arm above 90 degrees due to pain.   Psychiatric: She has a normal mood and affect. Her behavior is normal.          Assessment & Plan:  Left shoulder bursitis- discussed with her to schedule appt for perhaps another injection with Dr. Darene Lamer next week. I think it is reasonable to write her out for Thursday and Friday of this week and with long weekend should give her ample time to rest her arms. Encouraged since mobic helps to take daily while resting. Continue home stretches that have been given. Ice if needed.

## 2014-02-04 ENCOUNTER — Other Ambulatory Visit: Payer: Self-pay | Admitting: Sports Medicine

## 2014-02-05 ENCOUNTER — Ambulatory Visit: Payer: BC Managed Care – PPO | Admitting: Sports Medicine

## 2014-02-05 DIAGNOSIS — Z0289 Encounter for other administrative examinations: Secondary | ICD-10-CM

## 2014-02-20 ENCOUNTER — Ambulatory Visit (INDEPENDENT_AMBULATORY_CARE_PROVIDER_SITE_OTHER): Payer: BC Managed Care – PPO | Admitting: Sports Medicine

## 2014-02-20 ENCOUNTER — Encounter: Payer: Self-pay | Admitting: Sports Medicine

## 2014-02-20 VITALS — BP 119/81 | HR 70 | Ht 67.5 in | Wt 203.0 lb

## 2014-02-20 DIAGNOSIS — IMO0002 Reserved for concepts with insufficient information to code with codable children: Secondary | ICD-10-CM

## 2014-02-20 DIAGNOSIS — M7550 Bursitis of unspecified shoulder: Secondary | ICD-10-CM

## 2014-02-20 DIAGNOSIS — M75100 Unspecified rotator cuff tear or rupture of unspecified shoulder, not specified as traumatic: Secondary | ICD-10-CM

## 2014-02-20 MED ORDER — NITROGLYCERIN 0.2 MG/HR TD PT24: MEDICATED_PATCH | TRANSDERMAL | Status: DC

## 2014-02-20 NOTE — Progress Notes (Signed)
  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 I injected her left subacromial bursa in March of this year. She had a good response, unfortunately temporary, with 2 months of pain relief. She's never done physical therapy, and we have never consider biologic therapy. She continues to do significant overhead activities as she directs the choir, and uses meloxicam nearly daily. Pain is moderate, persistent, localized over the deltoid.  Past medical history, Surgical history, Family history not pertinant except as noted below, Social history, Allergies, and medications have been entered into the medical record, reviewed, and no changes needed.   Review of Systems: No fevers, chills, night sweats, weight loss, chest pain, or shortness of breath.   Objective:    General: Well Developed, well nourished, and in no acute distress.  Neuro: Alert and oriented x3, extra-ocular muscles intact, sensation grossly intact.  HEENT: Normocephalic, atraumatic, pupils equal round reactive to light, neck supple, no masses, no lymphadenopathy, thyroid nonpalpable.  Skin: Warm and dry, no rashes. Cardiac: Regular rate and rhythm, no murmurs rubs or gallops, no lower extremity edema.  Respiratory: Clear to auscultation bilaterally. Not using accessory muscles, speaking in full sentences. Left Shoulder: Inspection reveals no abnormalities, atrophy or asymmetry. Palpation is normal with no tenderness over AC joint or bicipital groove. ROM is full in all planes. Rotator cuff strength normal throughout. Positive Neer and Hawkin's tests, empty can sign. Speeds and Yergason's tests normal. No labral pathology noted with negative Obrien's, negative clunk and good stability. Normal scapular function observed. No painful arc and no drop arm sign. No apprehension sign  Procedure: Real-time Ultrasound Guided Injection of left subacromial  bursa Device: GE Logiq E  Verbal informed consent obtained.  Time-out conducted.  Noted no overlying erythema, induration, or other signs of local infection.  Skin prepped in a sterile fashion.  Local anesthesia: Topical Ethyl chloride.  With sterile technique and under real time ultrasound guidance:  1 cc Kenalog 40 and 3 cc lidocaine injected easily into the subacromial bursa which did appear distended on ultrasound. Completed without difficulty  Pain immediately resolved suggesting accurate placement of the medication.  Advised to call if fevers/chills, erythema, induration, drainage, or persistent bleeding.  Images permanently stored and available for review in the ultrasound unit.  Impression: Technically successful ultrasound guided injection.  Impression and Recommendations:

## 2014-02-20 NOTE — Assessment & Plan Note (Signed)
Previous injection only lasted one to 2 months and is very active Investment banker, corporate. Repeat subacromial injection, this time adding physical therapy and topical nitroglycerin as biologic therapy. Return to see me in 3 months. For 3 months

## 2014-03-01 ENCOUNTER — Ambulatory Visit (INDEPENDENT_AMBULATORY_CARE_PROVIDER_SITE_OTHER): Payer: BC Managed Care – PPO | Admitting: Physical Therapy

## 2014-03-01 DIAGNOSIS — M25519 Pain in unspecified shoulder: Secondary | ICD-10-CM

## 2014-03-01 DIAGNOSIS — IMO0002 Reserved for concepts with insufficient information to code with codable children: Secondary | ICD-10-CM

## 2014-03-01 DIAGNOSIS — R609 Edema, unspecified: Secondary | ICD-10-CM

## 2014-03-01 DIAGNOSIS — M25619 Stiffness of unspecified shoulder, not elsewhere classified: Secondary | ICD-10-CM

## 2014-03-01 DIAGNOSIS — M75100 Unspecified rotator cuff tear or rupture of unspecified shoulder, not specified as traumatic: Secondary | ICD-10-CM

## 2014-03-01 DIAGNOSIS — M6281 Muscle weakness (generalized): Secondary | ICD-10-CM

## 2014-03-13 ENCOUNTER — Encounter (INDEPENDENT_AMBULATORY_CARE_PROVIDER_SITE_OTHER): Payer: BC Managed Care – PPO | Admitting: Physical Therapy

## 2014-03-13 DIAGNOSIS — R609 Edema, unspecified: Secondary | ICD-10-CM

## 2014-03-13 DIAGNOSIS — M6281 Muscle weakness (generalized): Secondary | ICD-10-CM

## 2014-03-13 DIAGNOSIS — IMO0002 Reserved for concepts with insufficient information to code with codable children: Secondary | ICD-10-CM

## 2014-03-13 DIAGNOSIS — M25519 Pain in unspecified shoulder: Secondary | ICD-10-CM

## 2014-03-13 DIAGNOSIS — M75100 Unspecified rotator cuff tear or rupture of unspecified shoulder, not specified as traumatic: Secondary | ICD-10-CM

## 2014-03-13 DIAGNOSIS — M25619 Stiffness of unspecified shoulder, not elsewhere classified: Secondary | ICD-10-CM

## 2014-03-15 ENCOUNTER — Encounter (INDEPENDENT_AMBULATORY_CARE_PROVIDER_SITE_OTHER): Payer: BC Managed Care – PPO | Admitting: Physical Therapy

## 2014-03-15 DIAGNOSIS — M6281 Muscle weakness (generalized): Secondary | ICD-10-CM

## 2014-03-15 DIAGNOSIS — M25619 Stiffness of unspecified shoulder, not elsewhere classified: Secondary | ICD-10-CM

## 2014-03-15 DIAGNOSIS — M75100 Unspecified rotator cuff tear or rupture of unspecified shoulder, not specified as traumatic: Secondary | ICD-10-CM

## 2014-03-15 DIAGNOSIS — IMO0002 Reserved for concepts with insufficient information to code with codable children: Secondary | ICD-10-CM

## 2014-03-15 DIAGNOSIS — R609 Edema, unspecified: Secondary | ICD-10-CM

## 2014-03-15 DIAGNOSIS — M25519 Pain in unspecified shoulder: Secondary | ICD-10-CM

## 2014-03-19 ENCOUNTER — Encounter (INDEPENDENT_AMBULATORY_CARE_PROVIDER_SITE_OTHER): Payer: BC Managed Care – PPO | Admitting: Physical Therapy

## 2014-03-19 DIAGNOSIS — IMO0002 Reserved for concepts with insufficient information to code with codable children: Secondary | ICD-10-CM

## 2014-03-19 DIAGNOSIS — M6281 Muscle weakness (generalized): Secondary | ICD-10-CM

## 2014-03-19 DIAGNOSIS — M25619 Stiffness of unspecified shoulder, not elsewhere classified: Secondary | ICD-10-CM

## 2014-03-19 DIAGNOSIS — M25519 Pain in unspecified shoulder: Secondary | ICD-10-CM

## 2014-03-19 DIAGNOSIS — R609 Edema, unspecified: Secondary | ICD-10-CM

## 2014-03-19 DIAGNOSIS — M75100 Unspecified rotator cuff tear or rupture of unspecified shoulder, not specified as traumatic: Secondary | ICD-10-CM

## 2014-03-21 ENCOUNTER — Encounter: Payer: BC Managed Care – PPO | Admitting: Physical Therapy

## 2014-03-25 ENCOUNTER — Encounter (INDEPENDENT_AMBULATORY_CARE_PROVIDER_SITE_OTHER): Payer: BC Managed Care – PPO | Admitting: Physical Therapy

## 2014-03-25 DIAGNOSIS — M25619 Stiffness of unspecified shoulder, not elsewhere classified: Secondary | ICD-10-CM

## 2014-03-25 DIAGNOSIS — M6281 Muscle weakness (generalized): Secondary | ICD-10-CM

## 2014-03-25 DIAGNOSIS — R609 Edema, unspecified: Secondary | ICD-10-CM

## 2014-03-25 DIAGNOSIS — IMO0002 Reserved for concepts with insufficient information to code with codable children: Secondary | ICD-10-CM

## 2014-03-25 DIAGNOSIS — M75100 Unspecified rotator cuff tear or rupture of unspecified shoulder, not specified as traumatic: Secondary | ICD-10-CM

## 2014-03-25 DIAGNOSIS — M25519 Pain in unspecified shoulder: Secondary | ICD-10-CM

## 2014-03-26 ENCOUNTER — Encounter: Payer: BC Managed Care – PPO | Admitting: Physical Therapy

## 2014-04-09 ENCOUNTER — Encounter (INDEPENDENT_AMBULATORY_CARE_PROVIDER_SITE_OTHER): Payer: BC Managed Care – PPO | Admitting: Physical Therapy

## 2014-04-09 DIAGNOSIS — M25619 Stiffness of unspecified shoulder, not elsewhere classified: Secondary | ICD-10-CM

## 2014-04-09 DIAGNOSIS — IMO0002 Reserved for concepts with insufficient information to code with codable children: Secondary | ICD-10-CM

## 2014-04-09 DIAGNOSIS — M75100 Unspecified rotator cuff tear or rupture of unspecified shoulder, not specified as traumatic: Secondary | ICD-10-CM

## 2014-04-09 DIAGNOSIS — R609 Edema, unspecified: Secondary | ICD-10-CM

## 2014-04-09 DIAGNOSIS — M25519 Pain in unspecified shoulder: Secondary | ICD-10-CM

## 2014-04-09 DIAGNOSIS — M6281 Muscle weakness (generalized): Secondary | ICD-10-CM

## 2014-04-24 ENCOUNTER — Encounter: Payer: Self-pay | Admitting: Physician Assistant

## 2014-04-24 ENCOUNTER — Ambulatory Visit (INDEPENDENT_AMBULATORY_CARE_PROVIDER_SITE_OTHER): Payer: BC Managed Care – PPO | Admitting: Physician Assistant

## 2014-04-24 ENCOUNTER — Other Ambulatory Visit: Payer: Self-pay | Admitting: Physician Assistant

## 2014-04-24 VITALS — BP 120/73 | HR 83 | Wt 202.0 lb

## 2014-04-24 DIAGNOSIS — J029 Acute pharyngitis, unspecified: Secondary | ICD-10-CM | POA: Diagnosis not present

## 2014-04-24 DIAGNOSIS — J069 Acute upper respiratory infection, unspecified: Secondary | ICD-10-CM

## 2014-04-24 LAB — POCT RAPID STREP A (OFFICE): Rapid Strep A Screen: NEGATIVE

## 2014-04-24 MED ORDER — METHYLPREDNISOLONE (PAK) 4 MG PO TABS: ORAL_TABLET | ORAL | Status: DC

## 2014-04-24 MED ORDER — AMOXICILLIN 500 MG PO CAPS: 500.0000 mg | ORAL_CAPSULE | Freq: Two times a day (BID) | ORAL | Status: DC

## 2014-04-24 NOTE — Progress Notes (Signed)
   Subjective:    Patient ID: Shannon May, female    DOB: 08-Aug-1971, 43 y.o.   MRN: [id number]  HPI Pt presents to the clinic with 2 days of ST, ear fullness, and sinus pressure. She cannot tolerated decongestant but has been using mucinex which as helped. 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 Her room had a odd odor and felt wet when she went in this week. She denies any fever, chills, n/v/d. Her ST seems to be getting worse.    Review of Systems  All other systems reviewed and are negative.      Objective:   Physical Exam  Constitutional: She is oriented to person, place, and time. She appears well-developed and well-nourished.  HENT:  Head: Normocephalic and atraumatic.  Right Ear: External ear normal.  Left Ear: External ear normal.  Nose: Nose normal.  Mouth/Throat: Oropharynx is clear and moist. No oropharyngeal exudate.  TM's appear to have fluid behind them. No dullness, redness, blood or pus.   Right sided sinus tenderness to palpation.   Nasal turbinates red and swollen.   Eyes:  Bilateral injected conjunctiva with watery discharge.   Neck: Normal range of motion. Neck supple.  Bilateral cervical adenopathy.   Cardiovascular: Normal rate, regular rhythm and normal heart sounds.   Pulmonary/Chest: Effort normal and breath sounds normal. She has no wheezes.  Neurological: She is alert and oriented to person, place, and time.  Skin: Skin is dry.  Psychiatric: She has a normal mood and affect. Her behavior is normal.          Assessment & Plan:  URI/ST- rapid strep negative. Discussed with patient likely viral/allergic. Start medrol dose pack and continue mucinex twice daily. Start using flonase daily again. If not improving or if worsening in 3-4 days start amoxil. HO given. Ibuprofen for ST as well as gargle with salt water.

## 2014-04-24 NOTE — Patient Instructions (Signed)
Flonase daily.  Consider going back on claritin.  Prednisone pack to start.  Mucinex twice a day.   Augmentin if needed.   Upper Respiratory Infection, Adult An upper respiratory infection (URI) is also known as the common cold. It is often caused by a type of germ (virus). Colds are easily spread (contagious). You can pass it to others by kissing, coughing, sneezing, or drinking out of the same glass. Usually, you get better in 1 or 2 weeks.  HOME CARE   Only take medicine as told by your doctor.  Use a warm mist humidifier or breathe in steam from a hot shower.  Drink enough water and fluids to keep your pee (urine) clear or pale yellow.  Get plenty of rest.  Return to work when your temperature is back to normal or as told by your doctor. You may use a face mask and wash your hands to stop your cold from spreading. GET HELP RIGHT AWAY IF:   After the first few days, you feel you are getting worse.  You have questions about your medicine.  You have chills, shortness of breath, or brown or red spit (mucus).  You have yellow or brown snot (nasal discharge) or pain in the face, especially when you bend forward.  You have a fever, puffy (swollen) neck, pain when you swallow, or white spots in the back of your throat.  You have a bad headache, ear pain, sinus pain, or chest pain.  You have a high-pitched whistling sound when you breathe in and out (wheezing).  You have a lasting cough or cough up blood.  You have sore muscles or a stiff neck. MAKE SURE YOU:   Understand these instructions.  Will watch your condition.  Will get help right away if you are not doing well or get worse. Document Released: 02/09/2008 Document Revised: 11/15/2011 Document Reviewed: 11/28/2013 [facility] Patient Information 2015 [place], [place]. This information is not intended to replace advice given to you by your health care provider. Make sure you discuss any questions you have with your health  care provider.

## 2014-05-23 ENCOUNTER — Ambulatory Visit (INDEPENDENT_AMBULATORY_CARE_PROVIDER_SITE_OTHER): Payer: BC Managed Care – PPO | Admitting: Sports Medicine

## 2014-05-23 ENCOUNTER — Encounter: Payer: Self-pay | Admitting: Sports Medicine

## 2014-05-23 VITALS — BP 120/80 | HR 82 | Ht 67.5 in | Wt 203.0 lb

## 2014-05-23 DIAGNOSIS — M75100 Unspecified rotator cuff tear or rupture of unspecified shoulder, not specified as traumatic: Secondary | ICD-10-CM

## 2014-05-23 DIAGNOSIS — M722 Plantar fascial fibromatosis: Secondary | ICD-10-CM | POA: Diagnosis not present

## 2014-05-23 DIAGNOSIS — IMO0002 Reserved for concepts with insufficient information to code with codable children: Secondary | ICD-10-CM | POA: Diagnosis not present

## 2014-05-23 DIAGNOSIS — M7552 Bursitis of left shoulder: Secondary | ICD-10-CM

## 2014-05-23 DIAGNOSIS — M5416 Radiculopathy, lumbar region: Secondary | ICD-10-CM | POA: Insufficient documentation

## 2014-05-23 MED ORDER — MAGNESIUM OXIDE 400 MG PO TABS: 800.0000 mg | ORAL_TABLET | Freq: Every day | ORAL | Status: DC

## 2014-05-23 NOTE — Assessment & Plan Note (Signed)
Low back rehabilitation. Magnesium at bedtime.

## 2014-05-23 NOTE — Assessment & Plan Note (Signed)
Home rehabilitation, return in a month for custom orthotics.

## 2014-05-23 NOTE — Assessment & Plan Note (Signed)
Resolved with physical therapy.

## 2014-05-23 NOTE — Progress Notes (Signed)
  Subjective:    CC: Followup  HPI: Subacromial bursitis: Resolved after injection and now with physical therapy doing much better.  Leg cramps: Likely radicular, pain in the back, worse with flexion.  Heel pain: worse in the mornings, moderate, persistent without radiation.  Past medical history, Surgical history, Family history not pertinant except as noted below, Social history, Allergies, and medications have been entered into the medical record, reviewed, and no changes needed.   Review of Systems: No fevers, chills, night sweats, weight loss, chest pain, or shortness of breath.   Objective:    General: Well Developed, well nourished, and in no acute distress.  Neuro: Alert and oriented x3, extra-ocular muscles intact, sensation grossly intact.  HEENT: Normocephalic, atraumatic, pupils equal round reactive to light, neck supple, no masses, no lymphadenopathy, thyroid nonpalpable.  Skin: Warm and dry, no rashes. Cardiac: Regular rate and rhythm, no murmurs rubs or gallops, no lower extremity edema.  Respiratory: Clear to auscultation bilaterally. Not using accessory muscles, speaking in full sentences.  Impression and Recommendations:

## 2014-05-29 ENCOUNTER — Ambulatory Visit (INDEPENDENT_AMBULATORY_CARE_PROVIDER_SITE_OTHER): Payer: BC Managed Care – PPO | Admitting: Family Medicine

## 2014-05-29 ENCOUNTER — Encounter: Payer: Self-pay | Admitting: Family Medicine

## 2014-05-29 VITALS — BP 116/72 | HR 63 | Temp 98.3°F | Wt 204.0 lb

## 2014-05-29 DIAGNOSIS — R300 Dysuria: Secondary | ICD-10-CM | POA: Diagnosis not present

## 2014-05-29 LAB — POCT URINALYSIS DIPSTICK
Bilirubin, UA: NEGATIVE
Glucose, UA: 100
KETONES UA: NEGATIVE
Nitrite, UA: POSITIVE
PROTEIN UA: NEGATIVE
UROBILINOGEN UA: 1
pH, UA: 5.5

## 2014-05-29 MED ORDER — CIPROFLOXACIN HCL 250 MG PO TABS: ORAL_TABLET | ORAL | Status: AC

## 2014-05-29 MED ORDER — NITROFURANTOIN MONOHYD MACRO 100 MG PO CAPS: ORAL_CAPSULE | ORAL | Status: DC

## 2014-05-29 NOTE — Progress Notes (Signed)
CC: Shannon May is a 43 y.o. female is here for Urinary Tract Infection   Subjective: HPI:  Complains of dysuria, urinary hesitancy, and fatigue for the past 48 hours worsening on a daily basis. Slightly improved with AZO and ibuprofen. Symptoms feel like prior urinary tract infections. Symptoms are present all hours of the day not interfering with sleep. Overall symptoms are mild-to-moderate in severity. She denies fevers, chills, flank pain, abdominal pain, nausea, vomiting   Review Of Systems Outlined In HPI  No past medical history on file.  Past Surgical History  Procedure Laterality Date  . Dilation and curettage of uterus     Family History  Problem Relation Age of Onset  . Colon cancer Maternal Grandfather   . Melanoma Paternal Grandfather   . Heart attack Maternal Grandmother   . Hypertension Mother   . Hyperlipidemia Father     History   Social History  . Marital Status: Married    Spouse Name: Ronalee Belts    Number of Children: 3  . 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     Social History Main Topics  . Smoking status: Current Some Day Smoker    Types: Cigarettes  . Smokeless tobacco: Not on file  . Alcohol Use: 1.8 oz/week    3 Glasses of wine per week  . Drug Use: No  . Sexual Activity: Yes    Partners: Male   Other Topics Concern  . Not on file   Social History Narrative   [employer] for exercise.  Used to run for exerise. 2 caffeine drinks per day.      Objective: BP 116/72  Pulse 63  Temp(Src) 98.3 F (36.8 C) (Oral)  Wt 204 lb (92.534 kg)  Vital signs reviewed. General: Alert and Oriented, No Acute Distress HEENT: Pupils equal, round, reactive to light. Conjunctivae clear.  External ears unremarkable.  Moist mucous membranes. Lungs: Clear and comfortable work of breathing, speaking in full sentences without accessory muscle use. Cardiac: Regular rate and rhythm.  Back: No CVA  tenderness Extremities: No peripheral edema.  Strong peripheral pulses.  Mental Status: No depression, anxiety, nor agitation. Logical though process. Skin: Warm and dry.  Assessment & Plan: Odean was seen today for urinary tract infection.  Diagnoses and associated orders for this visit:  Dysuria - Urinalysis Dipstick - Urine Culture - ciprofloxacin (CIPRO) 250 MG tablet; Take one by mouth twice a day for five days. - nitrofurantoin, macrocrystal-monohydrate, (MACROBID) 100 MG capsule; One by mouth only after intercourse.    Urinalysis with blood and nitrites suspicious for UTI therefore start Cipro based on prior culture sensitivities. She requests a refill on postcoital antibiotics and tells me she has been prescribed Macrobid by [facility] in the past. She's been out of this for a few months.   Return if symptoms worsen or fail to improve.

## 2014-06-01 LAB — URINE CULTURE

## 2014-06-23 ENCOUNTER — Emergency Department
Admission: EM | Admit: 2014-06-23 | Discharge: 2014-06-23 | Disposition: A | Discharge status: Home / Self Care | Payer: BC Managed Care – PPO | Source: Home / Self Care | Attending: Emergency Medicine | Admitting: Emergency Medicine

## 2014-06-23 ENCOUNTER — Encounter: Payer: Self-pay | Admitting: Emergency Medicine

## 2014-06-23 DIAGNOSIS — J322 Chronic ethmoidal sinusitis: Secondary | ICD-10-CM

## 2014-06-23 MED ORDER — AMOXICILLIN-POT CLAVULANATE 875-125 MG PO TABS: 1.0000 | ORAL_TABLET | Freq: Two times a day (BID) | ORAL | Status: DC

## 2014-06-23 MED ORDER — PREDNISONE 10 MG PO TABS: ORAL_TABLET | ORAL | Status: DC

## 2014-06-23 NOTE — Discharge Instructions (Signed)

## 2014-06-23 NOTE — ED Provider Notes (Signed)
CSN: [id number]     Arrival date & time 06/23/14  1225 History   First MD Initiated Contact with Patient 06/23/14 1255     Chief Complaint  Patient presents with  . Nasal Congestion  . Headache  . Otalgia  . Generalized Body Aches  . Cough  . Hoarse   (Consider location/radiation/quality/duration/timing/severity/associated sxs/prior Treatment) Patient is a 43 y.o. female presenting with ear pain and cough. The history is provided by the patient. No language interpreter was used.  Otalgia Associated symptoms: cough, headaches and sore throat   Cough Cough characteristics:  Productive Sputum characteristics:  Green Severity:  Moderate Onset quality:  Gradual Duration:  1 week Timing:  Constant Progression:  Worsening Chronicity:  New Smoker: no   Context: not sick contacts   Relieved by:  Nothing Worsened by:  Nothing tried Ineffective treatments:  None tried Associated symptoms: ear pain, headaches, sinus congestion and sore throat   Risk factors: no recent infection   Pt complains of a sinus infection and allergy problems.  Pt reports she has mold allergies.   Pt reports she now thinks she has an infection  History reviewed. No pertinent past medical history. Past Surgical History  Procedure Laterality Date  . Dilation and curettage of uterus     Family History  Problem Relation Age of Onset  . Colon cancer Maternal Grandfather   . Melanoma Paternal Grandfather   . Heart attack Maternal Grandmother   . Hypertension Mother   . Hyperlipidemia Father    History  Substance Use Topics  . Smoking status: Current Some Day Smoker    Types: Cigarettes  . Smokeless tobacco: Not on file  . Alcohol Use: 1.8 oz/week    3 Glasses of wine per week   OB History   Grav Para Term Preterm Abortions TAB SAB Ect Mult Living   5 3   2 1 1   3      Review of Systems  HENT: Positive for ear pain and sore throat.   Respiratory: Positive for cough.   Neurological: Positive for  headaches.  All other systems reviewed and are negative.   Allergies  Sudafed and Sulfonamide derivatives  Home Medications   Prior to Admission medications   Medication Sig Start Date End Date Taking? Authorizing Provider  amoxicillin-clavulanate (AUGMENTIN) 875-125 MG per tablet Take 1 tablet by mouth every 12 (twelve) hours. 06/23/14   Fransico Meadow, PA-C  fluticasone [facility]) 50 MCG/ACT nasal spray PLACE 2 SPRAYS INTO THE NOSE DAILY 04/24/14   Jade L Breeback, PA-C  magnesium oxide (MAG-OX) 400 MG tablet Take 2 tablets (800 mg total) by mouth at bedtime. 05/23/14   Silverio Decamp, MD  meloxicam (MOBIC) 15 MG tablet ONE TAB PO QAM WITH BREAKFAST FOR 2 WEEKS, THEN DAILY PRN PAIN. 02/04/14   Silverio Decamp, MD  nitrofurantoin, macrocrystal-monohydrate, (MACROBID) 100 MG capsule One by mouth only after intercourse. 05/29/14   Marcial Pacas, DO  predniSONE (DELTASONE) 10 MG tablet 6,5,4,3,2,1 taper 06/23/14   Fransico Meadow, PA-C   BP 121/83  Pulse 68  Temp(Src) 97.9 F (36.6 C) (Oral)  Resp 16  Ht 5\' 7"  (1.702 m)  Wt 198 lb (89.812 kg)  BMI 31.00 kg/m2  SpO2 98% Physical Exam  Nursing note and vitals reviewed. Constitutional: She is oriented to person, place, and time. She appears well-developed and well-nourished.  HENT:  Head: Normocephalic and atraumatic.  Right Ear: External ear normal.  Left Ear: External ear normal.  Mouth/Throat: Oropharynx is clear and moist.  Tender maxillary and frontal sinuses  Eyes: Conjunctivae and EOM are normal. Pupils are equal, round, and reactive to light.  Neck: Normal range of motion.  Cardiovascular: Normal rate, regular rhythm and normal heart sounds.   Pulmonary/Chest: Effort normal and breath sounds normal.  Abdominal: Soft. She exhibits no distension.  Musculoskeletal: Normal range of motion.  Neurological: She is alert and oriented to person, place, and time.  Skin: Skin is warm.  Psychiatric: She has a normal mood and  affect.    ED Course  Procedures (including critical care time) Labs Review Labs Reviewed - No data to display  Imaging Review No results found.   MDM   1. Ethmoid sinusitis, unspecified chronicity    Augmentin Prednisone taper See Dr. Madilyn Fireman for recheck  AVS    Fransico Meadow, PA-C 06/23/14 1330

## 2014-06-23 NOTE — ED Notes (Signed)
Reports history of 6 days nasal/sinus congestion, cough, headache, ear pain, eye pain, hoarseness. OTCs ineffective.

## 2014-06-24 NOTE — ED Provider Notes (Signed)
Medical history/examination/treatment/procedure(s) were performed by non-physician provider and as supervising physician I was immediately available for consultation/collaboration.   Jacqulyn Cane, MD 06/24/14 1020

## 2014-07-01 ENCOUNTER — Encounter: Payer: BC Managed Care – PPO | Admitting: Sports Medicine

## 2014-07-08 ENCOUNTER — Encounter: Payer: Self-pay | Admitting: Emergency Medicine

## 2014-08-20 ENCOUNTER — Encounter: Payer: Self-pay | Admitting: Physician Assistant

## 2014-08-20 ENCOUNTER — Ambulatory Visit (INDEPENDENT_AMBULATORY_CARE_PROVIDER_SITE_OTHER): Payer: BC Managed Care – PPO | Admitting: Physician Assistant

## 2014-08-20 VITALS — BP 131/81 | HR 76 | Temp 98.1°F | Ht 67.0 in | Wt 203.0 lb

## 2014-08-20 DIAGNOSIS — J040 Acute laryngitis: Secondary | ICD-10-CM

## 2014-08-20 DIAGNOSIS — J0100 Acute maxillary sinusitis, unspecified: Secondary | ICD-10-CM | POA: Diagnosis not present

## 2014-08-20 MED ORDER — AZITHROMYCIN 250 MG PO TABS: ORAL_TABLET | ORAL | Status: DC

## 2014-08-20 MED ORDER — HYDROCODONE-HOMATROPINE 5-1.5 MG/5ML PO SYRP: 5.0000 mL | ORAL_SOLUTION | Freq: Every evening | ORAL | Status: DC | PRN

## 2014-08-20 MED ORDER — PREDNISONE 50 MG PO TABS: ORAL_TABLET | ORAL | Status: DC

## 2014-08-20 NOTE — Progress Notes (Signed)
   Subjective:    Patient ID: Shannon May, female    DOB: [birth year], 43 y.o.   MRN: [id number]  HPI  Pt is a 43 yo female who has had URI symptoms for 10 days. She lost her voice 5 days ago. She has lots of sinus pressure, ear pressure, cough with is minimally productive. No SOB or wheezing. No known fever. 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 She has tried mucinex and delsym with minimal relief.    Review of Systems  All other systems reviewed and are negative.      Objective:   Physical Exam  Constitutional: She is oriented to person, place, and time. She appears well-developed and well-nourished.  HENT:  Head: Normocephalic and atraumatic.  Right Ear: External ear normal.  Left Ear: External ear normal.  Nose: Nose normal.  Eyes: Conjunctivae are normal. Right eye exhibits no discharge. Left eye exhibits no discharge.  Cardiovascular: Normal rate, regular rhythm and normal heart sounds.   Pulmonary/Chest: Effort normal and breath sounds normal. She has no wheezes.  Neurological: She is alert and oriented to person, place, and time.  Skin: Skin is dry.  Psychiatric: She has a normal mood and affect. Her behavior is normal.          Assessment & Plan:  Acute maxillary sinusitis/laryngitis- treated with zpak and prednisone for 5 days. Hycodan for cough. HO given. Follow up if not improving.

## 2014-09-23 ENCOUNTER — Ambulatory Visit: Payer: BC Managed Care – PPO | Admitting: Physician Assistant

## 2014-10-18 ENCOUNTER — Telehealth: Payer: Self-pay | Admitting: Emergency Medicine

## 2014-10-18 NOTE — Telephone Encounter (Signed)
Patient calls to report visit to ER on Wednesday, 10-16-14 for abdominal cramping/pain; she was thoroughly evaluated and told she had colitis; she is now on Flagyl and Cipro, hydrocodone for pain, and senna laxative. Her concern today is constipation. After reviewing her record/history: hx diverticulitis 4 years ago/normal colonoscopy/no other GI problems: suggested ducolax suppository and/or fleets enema with 4-6 hour spacing between attempts. Patient understands.

## 2015-06-24 ENCOUNTER — Ambulatory Visit (INDEPENDENT_AMBULATORY_CARE_PROVIDER_SITE_OTHER): Payer: BC Managed Care – PPO | Admitting: Family Medicine

## 2015-06-24 ENCOUNTER — Encounter: Payer: Self-pay | Admitting: Family Medicine

## 2015-06-24 VITALS — BP 136/79 | HR 63 | Temp 98.1°F | Wt 206.0 lb

## 2015-06-24 DIAGNOSIS — M545 Low back pain: Secondary | ICD-10-CM | POA: Diagnosis not present

## 2015-06-24 DIAGNOSIS — R109 Unspecified abdominal pain: Secondary | ICD-10-CM

## 2015-06-24 LAB — POCT URINALYSIS DIPSTICK
Bilirubin, UA: NEGATIVE
Blood, UA: NEGATIVE
GLUCOSE UA: NEGATIVE
KETONES UA: NEGATIVE
Leukocytes, UA: NEGATIVE
NITRITE UA: NEGATIVE
Protein, UA: NEGATIVE
Spec Grav, UA: 1.01
Urobilinogen, UA: 0.2
pH, UA: 7

## 2015-06-24 NOTE — Progress Notes (Signed)
Subjective:    Patient ID: Shannon May, female    DOB: [birth year], 44 y.o.   MRN: [id number]  HPI Right  Sided back pain that started about 4 days ago and thought maybe was getting a UTI and increased her water intake.  Pain has been coming and going. She has never had a kidney stone.  She gets about 2 UTIs per year.   IBU does seem to help  + Nuasea. No vomiting. No urinary odor. No blood in the urine. No urgency.  No heavy lifting.    No fever, chills, etc.     Review of Systems  BP 136/79 mmHg  Pulse 63  Temp(Src) 98.1 F (36.7 C)  Wt 206 lb (93.441 kg)    Allergies  Allergen Reactions  . Sudafed [Pseudoephedrine Hcl]     shaking  . Sulfonamide Derivatives   . Erythromycin Nausea Only    No past medical history on file.  Past Surgical History  Procedure Laterality Date  . Dilation and curettage of uterus      Social History   Social History  . Marital Status: Married    Spouse Name: Ronalee Belts  . Number of Children: 3  . 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     Social History Main Topics  . Smoking status: Current Some Day Smoker    Types: Cigarettes  . Smokeless tobacco: Not on file  . Alcohol Use: 1.8 oz/week    3 Glasses of wine per week  . Drug Use: No  . Sexual Activity:    Partners: Male   Other Topics Concern  . Not on file   Social History Narrative   [employer] for exercise.  Used to run for exerise. 2 caffeine drinks per day.     Family History  Problem Relation Age of Onset  . Colon cancer Maternal Grandfather   . Melanoma Paternal Grandfather   . Heart attack Maternal Grandmother   . Hypertension Mother   . Hyperlipidemia Father     Outpatient Encounter Prescriptions as of 06/24/2015  Medication Sig  . fluticasone (FLONASE) 50 MCG/ACT nasal spray PLACE 2 SPRAYS INTO THE NOSE DAILY  . [DISCONTINUED] azithromycin (ZITHROMAX) 250 MG tablet Take 2 tablets now and then one tablet  for 5 days.  . [DISCONTINUED] HYDROcodone-homatropine (HYCODAN) 5-1.5 MG/5ML syrup Take 5 mLs by mouth at bedtime as needed.  . [DISCONTINUED] loratadine (CLARITIN) 10 MG tablet Take 10 mg by mouth daily.  . [DISCONTINUED] magnesium oxide (MAG-OX) 400 MG tablet Take 2 tablets (800 mg total) by mouth at bedtime. (Patient not taking: Reported on 08/20/2014)  . [DISCONTINUED] meloxicam (MOBIC) 15 MG tablet ONE TAB PO QAM WITH BREAKFAST FOR 2 WEEKS, THEN DAILY PRN PAIN.  . [DISCONTINUED] predniSONE (DELTASONE) 50 MG tablet Take one tablet for 5 days.   No facility-administered encounter medications on file as of 06/24/2015.          Objective:   Physical Exam  Constitutional: She is oriented to person, place, and time. She appears well-developed and well-nourished.  HENT:  Head: Normocephalic and atraumatic.  Cardiovascular: Normal rate, regular rhythm and normal heart sounds.   Pulmonary/Chest: Effort normal and breath sounds normal.  Abdominal: Soft. Bowel sounds are normal. She exhibits no distension and no mass. There is no tenderness. There is no rebound and no guarding.  Musculoskeletal:  Tender over the right flank over the ribs.  No pain with lumbar spine flexin, exteion or rotation.  nontender spine.   Neurological: She is alert and oriented to person, place, and time. She has normal reflexes.  Skin: Skin is warm and dry.  Psychiatric: She has a normal mood and affect. Her behavior is normal.          Assessment & Plan:  Right flank pain- UA neg for UTI. Will send culture. Will check labs with CBC.  Call if pain worsen.  No sign of urinary track stone. Could be MSK since does get relief with IBU. Work on gentle stretches. Call if not better in one week.

## 2015-06-25 LAB — CBC WITH DIFFERENTIAL/PLATELET
Basophils Absolute: 0 10*3/uL (ref 0.0–0.1)
Basophils Relative: 0 % (ref 0–1)
Eosinophils Absolute: 0.1 10*3/uL (ref 0.0–0.7)
Eosinophils Relative: 1 % (ref 0–5)
HCT: 39.4 % (ref 36.0–46.0)
HEMOGLOBIN: 13.5 g/dL (ref 12.0–15.0)
LYMPHS ABS: 2.9 10*3/uL (ref 0.7–4.0)
Lymphocytes Relative: 32 % (ref 12–46)
MCH: 32.3 pg (ref 26.0–34.0)
MCHC: 34.3 g/dL (ref 30.0–36.0)
MCV: 94.3 fL (ref 78.0–100.0)
MONO ABS: 0.6 10*3/uL (ref 0.1–1.0)
MONOS PCT: 7 % (ref 3–12)
MPV: 10.7 fL (ref 8.6–12.4)
NEUTROS ABS: 5.4 10*3/uL (ref 1.7–7.7)
Neutrophils Relative %: 60 % (ref 43–77)
Platelets: 297 10*3/uL (ref 150–400)
RBC: 4.18 MIL/uL (ref 3.87–5.11)
RDW: 13.2 % (ref 11.5–15.5)
WBC: 9 10*3/uL (ref 4.0–10.5)

## 2015-06-25 LAB — COMPLETE METABOLIC PANEL WITH GFR
ALK PHOS: 57 U/L (ref 33–115)
ALT: 14 U/L (ref 6–29)
AST: 18 U/L (ref 10–35)
Albumin: 4.2 g/dL (ref 3.6–5.1)
BILIRUBIN TOTAL: 0.6 mg/dL (ref 0.2–1.2)
BUN: 10 mg/dL (ref 7–25)
CO2: 27 mmol/L (ref 20–31)
Calcium: 9.2 mg/dL (ref 8.6–10.2)
Chloride: 102 mmol/L (ref 98–110)
Creat: 0.66 mg/dL (ref 0.50–1.10)
GFR, Est Non African American: >89 mL/min (ref >=60)
GLUCOSE: 79 mg/dL (ref 65–99)
Potassium: 4.2 mmol/L (ref 3.5–5.3)
SODIUM: 139 mmol/L (ref 135–146)
TOTAL PROTEIN: 6.9 g/dL (ref 6.1–8.1)

## 2015-06-25 NOTE — Progress Notes (Signed)
Quick Note:  All labs are normal. ______ 

## 2015-06-26 LAB — URINE CULTURE
Colony Count: NO GROWTH
ORGANISM ID, BACTERIA: NO GROWTH

## 2015-09-19 ENCOUNTER — Ambulatory Visit: Payer: BC Managed Care – PPO | Admitting: Physician Assistant

## 2016-01-05 ENCOUNTER — Encounter: Payer: Self-pay | Admitting: Family Medicine

## 2016-01-05 ENCOUNTER — Ambulatory Visit (INDEPENDENT_AMBULATORY_CARE_PROVIDER_SITE_OTHER): Payer: BC Managed Care – PPO | Admitting: Family Medicine

## 2016-01-05 VITALS — BP 122/83 | HR 90 | Ht 67.5 in | Wt 209.0 lb

## 2016-01-05 DIAGNOSIS — R109 Unspecified abdominal pain: Secondary | ICD-10-CM

## 2016-01-05 LAB — POCT URINALYSIS DIPSTICK
BILIRUBIN UA: NEGATIVE
Glucose, UA: NEGATIVE
Ketones, UA: NEGATIVE
LEUKOCYTES UA: NEGATIVE
NITRITE UA: NEGATIVE
PH UA: 6
Protein, UA: NEGATIVE
Spec Grav, UA: 1.01
Urobilinogen, UA: 0.2

## 2016-01-05 MED ORDER — METRONIDAZOLE 500 MG PO TABS: 500.0000 mg | ORAL_TABLET | Freq: Three times a day (TID) | ORAL | Status: DC

## 2016-01-05 MED ORDER — CIPROFLOXACIN HCL 500 MG PO TABS: 500.0000 mg | ORAL_TABLET | Freq: Two times a day (BID) | ORAL | Status: DC

## 2016-01-05 NOTE — Patient Instructions (Addendum)
Thank you for coming in today. Get labs today and Ultrasound tomorrow.  Start a clear liquid diet.  Return later this week if not better.  Take antibiotics.  Do not drink alcohol with these antibiotics. Use backup birth control taking these medications.  If your belly pain worsens, or you have high fever, bad vomiting, blood in your stool or black tarry stool go to the Emergency Room.   Abdominal Pain, Adult Many things can cause abdominal pain. Usually, abdominal pain is not caused by a disease and will improve without treatment. It can often be observed and treated at home. Your health care provider will do a physical exam and possibly order blood tests and X-rays to help determine the seriousness of your pain. However, in many cases, more time must pass before a clear cause of the pain can be found. Before that point, your health care provider may not know if you need more testing or further treatment. HOME CARE INSTRUCTIONS Monitor your abdominal pain for any changes. The following actions may help to alleviate any discomfort you are experiencing:  Only take over-the-counter or prescription medicines as directed by your health care provider.  Do not take laxatives unless directed to do so by your health care provider.  Try a clear liquid diet (broth, tea, or water) as directed by your health care provider. Slowly move to a bland diet as tolerated. SEEK MEDICAL CARE IF:  You have unexplained abdominal pain.  You have abdominal pain associated with nausea or diarrhea.  You have pain when you urinate or have a bowel movement.  You experience abdominal pain that wakes you in the night.  You have abdominal pain that is worsened or improved by eating food.  You have abdominal pain that is worsened with eating fatty foods.  You have a fever. SEEK IMMEDIATE MEDICAL CARE IF:  Your pain does not go away within 2 hours.  You keep throwing up (vomiting).  Your pain is felt only in  portions of the abdomen, such as the right side or the left lower portion of the abdomen.  You pass bloody or black tarry stools. MAKE SURE YOU:  Understand these instructions.  Will watch your condition.  Will get help right away if you are not doing well or get worse.   This information is not intended to replace advice given to you by your health care provider. Make sure you discuss any questions you have with your health care provider.   Document Released: 06/02/2005 Document Revised: 05/14/2015 Document Reviewed: 05/02/2013 Elsevier Interactive Patient Education [employer].

## 2016-01-06 ENCOUNTER — Ambulatory Visit (INDEPENDENT_AMBULATORY_CARE_PROVIDER_SITE_OTHER): Payer: BC Managed Care – PPO

## 2016-01-06 ENCOUNTER — Telehealth: Payer: Self-pay

## 2016-01-06 DIAGNOSIS — R1011 Right upper quadrant pain: Secondary | ICD-10-CM

## 2016-01-06 DIAGNOSIS — R109 Unspecified abdominal pain: Secondary | ICD-10-CM

## 2016-01-06 LAB — COMPREHENSIVE METABOLIC PANEL
ALT: 11 U/L (ref 6–29)
AST: 16 U/L (ref 10–35)
Albumin: 4.5 g/dL (ref 3.6–5.1)
Alkaline Phosphatase: 56 U/L (ref 33–115)
BUN: 13 mg/dL (ref 7–25)
CHLORIDE: 102 mmol/L (ref 98–110)
CO2: 23 mmol/L (ref 20–31)
CREATININE: 0.73 mg/dL (ref 0.50–1.10)
Calcium: 9.7 mg/dL (ref 8.6–10.2)
GLUCOSE: 84 mg/dL (ref 65–99)
Potassium: 4.1 mmol/L (ref 3.5–5.3)
SODIUM: 136 mmol/L (ref 135–146)
TOTAL PROTEIN: 7 g/dL (ref 6.1–8.1)
Total Bilirubin: 0.5 mg/dL (ref 0.2–1.2)

## 2016-01-06 LAB — CBC
HCT: 40.8 % (ref 35.0–45.0)
Hemoglobin: 13.6 g/dL (ref 11.7–15.5)
MCH: 31.6 pg (ref 27.0–33.0)
MCHC: 33.3 g/dL (ref 32.0–36.0)
MCV: 94.9 fL (ref 80.0–100.0)
MPV: 10.4 fL (ref 7.5–12.5)
PLATELETS: 283 10*3/uL (ref 140–400)
RBC: 4.3 MIL/uL (ref 3.80–5.10)
RDW: 12.8 % (ref 11.0–15.0)
WBC: 8.2 10*3/uL (ref 3.8–10.8)

## 2016-01-06 LAB — LIPASE: Lipase: 21 U/L (ref 7–60)

## 2016-01-06 NOTE — Progress Notes (Signed)
Quick Note:    Labs are normal.  ______

## 2016-01-06 NOTE — Telephone Encounter (Signed)
I think she should continue keeping the antibiotics because is more likely that she has diverticulitis.

## 2016-01-06 NOTE — Progress Notes (Signed)
Quick Note:  Ultrasound of the abdomen is normal. ______

## 2016-01-06 NOTE — Telephone Encounter (Signed)
Patient called to ask if she should stop taking the antibiotics due to normal test results.

## 2016-01-06 NOTE — Progress Notes (Signed)
Shannon May is a 45 y.o. female who presents to [facility] today for abdominal pain. Patient notes a one-day history of right-sided abdominal pain. Pain is worse with standing and sitting. The pain is somewhat consistent but much milder than previous episodes of diverticulitis versus colitis. She hasn't she's been constipated recently but had a normal bowel movement after some stool softeners. She notes the pain does not seem to be related to food. She denies any blood in her stools. No fevers chills vomiting or diarrhea. Additionally she denies any urinary frequency urgency or dysuria.   No past medical history on file. Past Surgical History  Procedure Laterality Date  . Dilation and curettage of uterus     Social History  Substance Use Topics  . Smoking status: Current Some Day Smoker    Types: Cigarettes  . Smokeless tobacco: Not on file  . Alcohol Use: 1.8 oz/week    3 Glasses of wine per week   family history includes Colon cancer in her maternal grandfather; Heart attack in her maternal grandmother; Hyperlipidemia in her father; Hypertension in her mother; Melanoma in her paternal grandfather.  ROS as above Medications: Current Outpatient Prescriptions  Medication Sig Dispense Refill  . fluticasone (FLONASE) 50 MCG/ACT nasal spray PLACE 2 SPRAYS INTO THE NOSE DAILY 16 g 1  . ciprofloxacin (CIPRO) 500 MG tablet Take 1 tablet (500 mg total) by mouth 2 (two) times daily. 14 tablet 0  . metroNIDAZOLE (FLAGYL) 500 MG tablet Take 1 tablet (500 mg total) by mouth 3 (three) times daily. 21 tablet 0   No current facility-administered medications for this visit.   Allergies  Allergen Reactions  . Sudafed [Pseudoephedrine Hcl]     shaking  . Sulfonamide Derivatives   . Erythromycin Nausea Only     Exam:  BP 122/83 mmHg  Pulse 90  Ht 5' 7.5" (1.715 m)  Wt 209 lb  (94.802 kg)  BMI 32.23 kg/m2 Gen: Well NAD HEENT: EOMI,  MMM Lungs: Normal work of breathing. CTABL Heart: RRR no MRG Abd: NABS, Soft. Nondistended, Mildly tender to palpation right upper quadrant with no rebound or guarding. Exts: Brisk capillary refill, warm and well perfused.   Results for orders placed or performed in visit on 01/05/16 (from the past 24 hour(s))  POCT Urinalysis Dipstick     Status: None   Collection Time: 01/05/16  2:48 PM  Result Value Ref Range   Color, UA YELLOW    Clarity, UA CLEAR    Glucose, UA NEGATIVE    Bilirubin, UA NEGATIVE    Ketones, UA NEGATIVE    Spec Grav, UA 1.010    Blood, UA TRACE    pH, UA 6.0    Protein, UA NEGATIVE    Urobilinogen, UA 0.2    Nitrite, UA NEGATIVE    Leukocytes, UA Negative Negative  CBC     Status: None   Collection Time: 01/05/16  3:11 PM  Result Value Ref Range   WBC 8.2 3.8 - 10.8 K/uL   RBC 4.30 3.80 - 5.10 MIL/uL   Hemoglobin 13.6 11.7 - 15.5 g/dL   HCT 40.8 35.0 - 45.0 %   MCV 94.9 80.0 - 100.0 fL   MCH 31.6 27.0 - 33.0 pg   MCHC 33.3 32.0 - 36.0 g/dL   RDW 12.8 11.0 - 15.0 %   Platelets 283 140 - 400 K/uL   MPV 10.4 7.5 - 12.5 fL  Narrative   Performed at:  [facility], [id number]                [place], [place] [address] HAS THE PATIENT FASTED?->NO; SOURCE: BLD&BLOOD; HAS THE PATI  Comprehensive metabolic panel     Status: None   Collection Time: 01/05/16  3:11 PM  Result Value Ref Range   Sodium 136 135 - 146 mmol/L   Potassium 4.1 3.5 - 5.3 mmol/L   Chloride 102 98 - 110 mmol/L   CO2 23 20 - 31 mmol/L   Glucose, Bld 84 65 - 99 mg/dL   BUN 13 7 - 25 mg/dL   Creat 0.73 0.50 - 1.10 mg/dL   Total Bilirubin 0.5 0.2 - 1.2 mg/dL   Alkaline Phosphatase 56 33 - 115 U/L   AST 16 10 - 35 U/L   ALT 11 6 - 29 U/L   Total Protein 7.0 6.1 - 8.1 g/dL   Albumin 4.5 3.6 - 5.1 g/dL   Calcium 9.7 8.6 - 10.2 mg/dL   Narrative   Performed at:  [facility], [id number]                , [place] [address]  Lipase     Status: None   Collection Time: 01/05/16  3:11 PM  Result Value Ref Range   Lipase 21 7 - 60 U/L   Narrative   Performed at:  [place], [id number]                , [place] [address]   No results found.  45 year old woman with abdominal pain: Unclear etiology. The exam and history are not very consistent with diverticulitis. This may be cholecystitis or cholelithiasis. Plan for clear liquid diet CBC CMP and lipase and abdominal ultrasound. Additionally we will treat empirically with Cipro and Flagyl for the possibility of diverticulitis as patient says the symptoms are somewhat consistent. Recheck later this week. Return sooner if worsening.  Note labs are back and are normal

## 2016-01-07 NOTE — Telephone Encounter (Signed)
Left message advising of recommendations.  

## 2016-01-08 NOTE — Telephone Encounter (Signed)
Patient advised.

## 2016-01-26 ENCOUNTER — Ambulatory Visit (INDEPENDENT_AMBULATORY_CARE_PROVIDER_SITE_OTHER): Payer: BC Managed Care – PPO

## 2016-01-26 ENCOUNTER — Ambulatory Visit (HOSPITAL_COMMUNITY): Payer: BC Managed Care – PPO

## 2016-01-26 ENCOUNTER — Ambulatory Visit (INDEPENDENT_AMBULATORY_CARE_PROVIDER_SITE_OTHER): Payer: BC Managed Care – PPO | Admitting: Sports Medicine

## 2016-01-26 DIAGNOSIS — R12 Heartburn: Secondary | ICD-10-CM | POA: Diagnosis not present

## 2016-01-26 DIAGNOSIS — R42 Dizziness and giddiness: Secondary | ICD-10-CM

## 2016-01-26 DIAGNOSIS — R1013 Epigastric pain: Secondary | ICD-10-CM | POA: Diagnosis not present

## 2016-01-26 DIAGNOSIS — R0789 Other chest pain: Secondary | ICD-10-CM | POA: Diagnosis not present

## 2016-01-26 MED ORDER — IOPAMIDOL (ISOVUE-300) INJECTION 61%
100.0000 mL | Freq: Once | INTRAVENOUS | Status: AC | PRN
  Administered 2016-01-26: 100 mL via INTRAVENOUS

## 2016-01-26 MED ORDER — ESOMEPRAZOLE MAGNESIUM 40 MG PO CPDR: DELAYED_RELEASE_CAPSULE | ORAL | Status: DC

## 2016-01-26 NOTE — Assessment & Plan Note (Signed)
This is likely gastritis versus hiatal hernia. There is also an element of panic. Referral for stress testing, CT of the abdomen and pelvis with oral and IV contrast. Lab work including cardiac enzymes, starting Nexium high-dose. Follow-up with PCP.

## 2016-01-26 NOTE — Progress Notes (Signed)
  Subjective:    CC: Epigastric and chest pain  HPI: For weeks this pleasant 45 year old female has had pain that she localizes in her upper abdomen and behind the chest, pressure, worse with laying flat on her back. She gets significant anxiety, also a bit of orthostasis. Pain is nonexertional, not associated with food, denies any melena, hematochezia, hematemesis. Symptoms are moderate, persistent. She is tearful in the exam room. No nausea or diaphoresis during the episodes.  Past medical history, Surgical history, Family history not pertinant except as noted below, Social history, Allergies, and medications have been entered into the medical record, reviewed, and no changes needed.   Review of Systems: No fevers, chills, night sweats, weight loss, chest pain, or shortness of breath.   Objective:    General: Well Developed, well nourished, and in no acute distress.  Neuro: Alert and oriented x3, extra-ocular muscles intact, sensation grossly intact.  HEENT: Normocephalic, atraumatic, pupils equal round reactive to light, neck supple, no masses, no lymphadenopathy, thyroid nonpalpable.  Skin: Warm and dry, no rashes. Cardiac: Regular rate and rhythm, no murmurs rubs or gallops, no lower extremity edema.  Respiratory: Clear to auscultation bilaterally. Not using accessory muscles, speaking in full sentences. Abdomen: Soft, tender to palpation the epigastrium, nondistended, normal bowel sounds, no palpable masses, no guarding, rigidity, rebound tenderness.  Impression and Recommendations:    I spent 25 minutes with this patient, greater than 50% was face-to-face time counseling regarding the above diagnoses

## 2016-01-26 NOTE — Addendum Note (Signed)
Addended by: Elizabeth Sauer on: 01/26/2016 12:17 PM   Modules accepted: Orders, Medications

## 2016-01-27 LAB — COMPREHENSIVE METABOLIC PANEL
ALT: 15 U/L (ref 6–29)
AST: 17 U/L (ref 10–35)
Albumin: 4.5 g/dL (ref 3.6–5.1)
Alkaline Phosphatase: 53 U/L (ref 33–115)
CO2: 27 mmol/L (ref 20–31)
Chloride: 100 mmol/L (ref 98–110)
Glucose, Bld: 89 mg/dL (ref 65–99)
Potassium: 4 mmol/L (ref 3.5–5.3)
Sodium: 138 mmol/L (ref 135–146)
Total Protein: 6.8 g/dL (ref 6.1–8.1)

## 2016-01-27 LAB — CK TOTAL AND CKMB (NOT AT ARMC)
CK, MB: <0.7 ng/mL (ref 0.0–5.0)
Total CK: 58 U/L (ref 7–177)

## 2016-01-27 LAB — CBC WITH DIFFERENTIAL/PLATELET
Basophils Absolute: 0 {cells}/uL (ref 0–200)
Basophils Relative: 0 %
Eosinophils Absolute: 80 {cells}/uL (ref 15–500)
Eosinophils Relative: 1 %
HCT: 39.9 % (ref 35.0–45.0)
Hemoglobin: 13.4 g/dL (ref 11.7–15.5)
Lymphocytes Relative: 30 %
Lymphs Abs: 2400 cells/uL (ref 850–3900)
MCH: 32.4 pg (ref 27.0–33.0)
MCHC: 33.6 g/dL (ref 32.0–36.0)
MCV: 96.6 fL (ref 80.0–100.0)
MPV: 10.7 fL (ref 7.5–12.5)
Monocytes Absolute: 560 cells/uL (ref 200–950)
Monocytes Relative: 7 %
Neutro Abs: 4960 {cells}/uL (ref 1500–7800)
Neutrophils Relative %: 62 %
Platelets: 300 K/uL (ref 140–400)
RBC: 4.13 MIL/uL (ref 3.80–5.10)
RDW: 12.9 % (ref 11.0–15.0)
WBC: 8 10*3/uL (ref 3.8–10.8)

## 2016-01-27 LAB — COMPREHENSIVE METABOLIC PANEL WITH GFR
BUN: 9 mg/dL (ref 7–25)
Calcium: 9.3 mg/dL (ref 8.6–10.2)
Creat: 0.74 mg/dL (ref 0.50–1.10)
Total Bilirubin: 0.5 mg/dL (ref 0.2–1.2)

## 2016-01-27 LAB — AMYLASE: Amylase: 30 U/L (ref 0–105)

## 2016-01-27 LAB — LIPASE: Lipase: 17 U/L (ref 7–60)

## 2016-01-29 LAB — TROPONIN T: Troponin T TROPT: <0.01 ng/mL (ref <0.01)

## 2016-02-03 ENCOUNTER — Ambulatory Visit (INDEPENDENT_AMBULATORY_CARE_PROVIDER_SITE_OTHER): Payer: BC Managed Care – PPO

## 2016-02-03 DIAGNOSIS — R1013 Epigastric pain: Secondary | ICD-10-CM

## 2016-02-03 LAB — EXERCISE TOLERANCE TEST
Estimated workload: 10.5 METS
Exercise duration (min): 9 min
Exercise duration (sec): 19 s
MPHR: 174 {beats}/min
Peak HR: 153 {beats}/min
Percent HR: 87 %
RPE: 17
Rest HR: 70 {beats}/min

## 2016-02-10 ENCOUNTER — Encounter: Payer: Self-pay | Admitting: Family Medicine

## 2016-02-10 ENCOUNTER — Ambulatory Visit (INDEPENDENT_AMBULATORY_CARE_PROVIDER_SITE_OTHER): Payer: BC Managed Care – PPO | Admitting: Family Medicine

## 2016-02-10 VITALS — BP 121/68 | HR 68 | Wt 205.0 lb

## 2016-02-10 DIAGNOSIS — R1013 Epigastric pain: Secondary | ICD-10-CM | POA: Diagnosis not present

## 2016-02-10 DIAGNOSIS — R0789 Other chest pain: Secondary | ICD-10-CM | POA: Diagnosis not present

## 2016-02-10 DIAGNOSIS — Z800 Family history of malignant neoplasm of digestive organs: Secondary | ICD-10-CM | POA: Diagnosis not present

## 2016-02-10 DIAGNOSIS — K5909 Other constipation: Secondary | ICD-10-CM

## 2016-02-10 DIAGNOSIS — K5900 Constipation, unspecified: Secondary | ICD-10-CM | POA: Diagnosis not present

## 2016-02-10 NOTE — Progress Notes (Signed)
Subjective:    CC: Follow-up abdominal pain  HPI:  Patient was seen in the office approximately 3 weeks ago by Dr. Dianah Field for epigastric pain and chest pain and pressure that was worse when laying flat on her back. She also complained of some orthostasis. She did have CT of the abdomen pelvis with contrast which was negative for any acute changes. He recommended putting her on a proton pump inhibitor thinking that her symptoms were most consistent with gastritis. She had a normal CBC as well as metabolic panel. Negative labs for pancreatitis and negative labs for acute MI. He also had an exercise tolerance test. Stress test was negative.  CP has resolved since taking the nexium.   She also reports that she has a history of chronic constipation as well as diverticulosis. She's been taking a stool softener once a day. She's also been taking some MiraLAX a couple times a week.  She feels like this has helped her as well.  Past medical history, Surgical history, Family history not pertinant except as noted below, Social history, Allergies, and medications have been entered into the medical record, reviewed, and corrections made.   Review of Systems: No fevers, chills, night sweats, weight loss, chest pain, or shortness of breath.   Objective:    General: Well Developed, well nourished, and in no acute distress.  Neuro: Alert and oriented x3, extra-ocular muscles intact, sensation grossly intact.  HEENT: Normocephalic, atraumatic  Skin: Warm and dry, no rashes. Cardiac: Regular rate and rhythm, no murmurs rubs or gallops, no lower extremity edema.  Respiratory: Clear to auscultation bilaterally. Not using accessory muscles, speaking in full sentences.   Impression and Recommendations:   Atypical chest pain-resolved.  Epigastric pain-  Doing well on PPI adn probiotic.  Continue treatment for 6 weeks and at that point she's doing really well decrease down to every other day for couple of  weeks and then every third day for couple weeks and then stop.  Chronic constipation-we discussed increasing her stools not better to 3 times a day, one with each meal. She is using MiraLAX occasionally as well. Also discussed with her that she can use that more frequently as well. We also discussed that there are 2 perception medications currently on the market. The appropriate for chronic constipation that we could also discuss in the future as well.    colonoscpy DUE

## 2016-02-10 NOTE — Patient Instructions (Signed)
Take the nexium for 6 weeks and then taper off slowly for a few weeks.

## 2016-02-22 ENCOUNTER — Emergency Department
Admission: EM | Admit: 2016-02-22 | Discharge: 2016-02-22 | Disposition: A | Discharge status: Home / Self Care | Payer: BC Managed Care – PPO | Source: Home / Self Care | Attending: Family Medicine | Admitting: Family Medicine

## 2016-02-22 ENCOUNTER — Encounter: Payer: Self-pay | Admitting: Emergency Medicine

## 2016-02-22 DIAGNOSIS — N390 Urinary tract infection, site not specified: Secondary | ICD-10-CM | POA: Diagnosis not present

## 2016-02-22 LAB — POCT URINALYSIS DIP (MANUAL ENTRY)
Bilirubin, UA: NEGATIVE
Blood, UA: NEGATIVE
Glucose, UA: 100 — AB
Ketones, POC UA: NEGATIVE
Nitrite, UA: POSITIVE — AB
Protein Ur, POC: NEGATIVE
Spec Grav, UA: 1.01 (ref 1.005–1.03)
Urobilinogen, UA: 1 (ref 0–1)
pH, UA: 6.5 (ref 5–8)

## 2016-02-22 MED ORDER — CEPHALEXIN 500 MG PO CAPS: 500.0000 mg | ORAL_CAPSULE | Freq: Two times a day (BID) | ORAL | Status: DC

## 2016-02-22 NOTE — Discharge Instructions (Signed)
Please take antibiotics as prescribed and be sure to complete entire course even if you start to feel better to ensure infection does not come back.  You may continue to take over the counter Azo to help with urinary symptoms.

## 2016-02-22 NOTE — ED Notes (Signed)
Patient presents with symptoms of cystitis, since Wednesday. Dysuria hesitancy urgency, denies fever.

## 2016-02-22 NOTE — ED Provider Notes (Signed)
CSN: [id number]     Arrival date & time 02/22/16  1218 History   First MD Initiated Contact with Patient 02/22/16 1224     Chief Complaint  Patient presents with  . Cystitis   (Consider location/radiation/quality/duration/timing/severity/associated sxs/prior Treatment) HPI  Shannon May is a 45 y.o. female presenting to UC with c/o gradually worsening urinary symptoms including pain with urination, urgency and frequency for about 3-4 days.  She reports hx of frequent UTIs in the past but had been prescribed macrobid to take after intercourse, her last UTI was about 2 years ago.  She admits to forgetting to take the antibiotic earlier this week. She has been drinking more water but worse this morning with burning urinary symptoms. She did take Azo last night with minimal relief.  Denies fever, chills, n/v/d.    History reviewed. No pertinent past medical history. Past Surgical History  Procedure Laterality Date  . Dilation and curettage of uterus     Family History  Problem Relation Age of Onset  . Colon cancer Maternal Grandfather   . Melanoma Paternal Grandfather   . Heart attack Maternal Grandmother   . Hypertension Mother   . Hyperlipidemia Father    Social History  Substance Use Topics  . Smoking status: Former Smoker    Types: Cigarettes  . Smokeless tobacco: None  . Alcohol Use: 1.8 oz/week    3 Glasses of wine per week   OB History    Gravida Para Term Preterm AB TAB SAB Ectopic Multiple Living   5 3   2 1 1   3      Review of Systems  Constitutional: Negative for fever and chills.  Gastrointestinal: Negative for nausea, vomiting, abdominal pain and diarrhea.  Genitourinary: Positive for dysuria, urgency and frequency. Negative for hematuria, flank pain and pelvic pain.  Musculoskeletal: Negative for myalgias and back pain.    Allergies  Sudafed; Sulfonamide derivatives; and Erythromycin  Home Medications   Prior to Admission medications   Medication Sig  Start Date End Date Taking? Authorizing Provider  Ascorbic Acid (VITAMIN C PO) Take by mouth daily.    Historical Provider, MD  aspirin 81 MG chewable tablet Chew 81 mg by mouth daily.    Historical Provider, MD  CALCIUM PO Take by mouth daily.    Historical Provider, MD  cephALEXin (KEFLEX) 500 MG capsule Take 1 capsule (500 mg total) by mouth 2 (two) times daily. For 7 days 02/22/16   Noland Fordyce, PA-C  CRANBERRY PO Take by mouth daily.    Historical Provider, MD  Cyanocobalamin (VITAMIN B 12 PO) Take by mouth daily.    Historical Provider, MD  Docusate Calcium (STOOL SOFTENER PO) Take by mouth as needed.    Historical Provider, MD  esomeprazole (NEXIUM) 40 MG capsule One tab by mouth at dinner time. 01/26/16   Silverio Decamp, MD  fluticasone (FLONASE) 50 MCG/ACT nasal spray PLACE 2 SPRAYS INTO THE NOSE DAILY 04/24/14   Jade L Breeback, PA-C  loratadine (CLARITIN) 10 MG tablet Take 10 mg by mouth daily.    Historical Provider, MD  Prenatal Vit-Fe Fumarate-FA (M-VIT PO) Take by mouth daily.    Historical Provider, MD  Probiotic Product (PRO-BIOTIC BLEND PO) Take by mouth daily.    Historical Provider, MD   Meds Ordered and Administered this Visit  Medications - No data to display  BP 136/86 mmHg  Pulse 75  Temp(Src) 98.1 F (36.7 C) (Oral)  Resp 18  Ht 5' 7.5" (  1.715 m)  Wt 207 lb 12 oz (94.235 kg)  BMI 32.04 kg/m2  SpO2 97% No data found.   Physical Exam  Constitutional: She is oriented to person, place, and time. She appears well-developed and well-nourished.  HENT:  Head: Normocephalic and atraumatic.  Mouth/Throat: Oropharynx is clear and moist.  Eyes: EOM are normal.  Neck: Normal range of motion.  Cardiovascular: Normal rate, regular rhythm and normal heart sounds.   Pulmonary/Chest: Effort normal and breath sounds normal. No respiratory distress. She has no wheezes. She has no rales.  Abdominal: Soft. She exhibits no distension. There is no tenderness. There is no  CVA tenderness.  Musculoskeletal: Normal range of motion.  Neurological: She is alert and oriented to person, place, and time.  Skin: Skin is warm and dry.  Psychiatric: She has a normal mood and affect. Her behavior is normal.  Nursing note and vitals reviewed.   ED Course  Procedures (including critical care time)  Labs Review Labs Reviewed  POCT URINALYSIS DIP (MANUAL ENTRY) - Abnormal; Notable for the following:    Color, UA orange (*)    Glucose, UA =100 (*)    Nitrite, UA Positive (*)    Leukocytes, UA large (3+) (*)    All other components within normal limits    Imaging Review No results found.    MDM   1. UTI (lower urinary tract infection)    Symptoms c/w prior UTIs.  Pt is afebrile, no CVA tenderness.  UA c/w UTI. Will send culture.  Rx: Keflex   May continue to use OTC Azo. Encouraged good hydration. F/u with PCP in 4-5 days if not improving. Patient verbalized understanding and agreement with treatment plan.      Noland Fordyce, PA-C 02/22/16 1320

## 2016-02-24 ENCOUNTER — Telehealth: Payer: Self-pay | Admitting: *Deleted

## 2016-02-24 NOTE — ED Notes (Signed)
Callback: Patient reports she is improving, advised UCX was not sent. No need to come back to recollect since she is improving. She reports she believes she is getting a yeast infection from the antibiotic, per our protocol, called in diflucan to her pharmacy , [facility] [address], left on provider VM.

## 2016-05-02 ENCOUNTER — Emergency Department
Admission: EM | Admit: 2016-05-02 | Discharge: 2016-05-02 | Disposition: A | Discharge status: Home / Self Care | Payer: BC Managed Care – PPO | Source: Home / Self Care | Attending: Family Medicine | Admitting: Family Medicine

## 2016-05-02 ENCOUNTER — Encounter: Payer: Self-pay | Admitting: Emergency Medicine

## 2016-05-02 DIAGNOSIS — N390 Urinary tract infection, site not specified: Secondary | ICD-10-CM | POA: Diagnosis not present

## 2016-05-02 LAB — POCT URINALYSIS DIP (MANUAL ENTRY)
Bilirubin, UA: NEGATIVE
Glucose, UA: NEGATIVE
Ketones, POC UA: NEGATIVE
Nitrite, UA: POSITIVE — AB
Protein Ur, POC: NEGATIVE
Spec Grav, UA: 1.02 (ref 1.005–1.03)
Urobilinogen, UA: 0.2 (ref 0–1)
pH, UA: 7 (ref 5–8)

## 2016-05-02 MED ORDER — CIPROFLOXACIN HCL 250 MG PO TABS: 250.0000 mg | ORAL_TABLET | Freq: Two times a day (BID) | ORAL | 0 refills | Status: AC

## 2016-05-02 MED ORDER — FLUCONAZOLE 200 MG PO TABS: ORAL_TABLET | ORAL | 0 refills | Status: DC

## 2016-05-02 NOTE — ED Provider Notes (Signed)
CSN: [id number]     Arrival date & time 05/02/16  1255 History   First MD Initiated Contact with Patient 05/02/16 1317     Chief Complaint  Patient presents with  . Cystitis   (Consider location/radiation/quality/duration/timing/severity/associated sxs/prior Treatment) HPI  Shannon May is a 45 y.o. female presenting to UC with c/o urinary urgency, hesitancy, and lower back pain for 4 days.  She reports hx of recurrent UTIs and believes current symptoms are due to recent swimming pool and hot tub use. Symptoms feel similar to prior UTIs, moderate improvement with OTC Azo.  She has not taken yesterday or today as she is aware it can alter UA results.  Denies vaginal symptoms. Pt was treated with Keflex for a UTI in June, symptoms did resolve completely, however a urine culture was not sent at that time. Denies fever, chills, n/v/d.    History reviewed. No pertinent past medical history. Past Surgical History:  Procedure Laterality Date  . DILATION AND CURETTAGE OF UTERUS     Family History  Problem Relation Age of Onset  . Colon cancer Maternal Grandfather   . Melanoma Paternal Grandfather   . Heart attack Maternal Grandmother   . Hypertension Mother   . Hyperlipidemia Father    Social History  Substance Use Topics  . Smoking status: Former Smoker    Types: Cigarettes  . Smokeless tobacco: Never Used  . Alcohol use 1.8 oz/week    3 Glasses of wine per week   OB History    Gravida Para Term Preterm AB Living   5 3     2 3    SAB TAB Ectopic Multiple Live Births   1 1           Review of Systems  Constitutional: Negative for chills and fever.  Gastrointestinal: Negative for diarrhea, nausea and vomiting.  Genitourinary: Positive for dysuria, frequency, pelvic pain and urgency. Negative for decreased urine volume, flank pain, hematuria, vaginal bleeding, vaginal discharge and vaginal pain.  Musculoskeletal: Positive for back pain. Negative for myalgias.    Allergies   Sudafed [pseudoephedrine hcl]; Sulfonamide derivatives; and Erythromycin  Home Medications   Prior to Admission medications   Medication Sig Start Date End Date Taking? Authorizing Provider  Ascorbic Acid (VITAMIN C PO) Take by mouth daily.    Historical Provider, MD  aspirin 81 MG chewable tablet Chew 81 mg by mouth daily.    Historical Provider, MD  CALCIUM PO Take by mouth daily.    Historical Provider, MD  cephALEXin (KEFLEX) 500 MG capsule Take 1 capsule (500 mg total) by mouth 2 (two) times daily. For 7 days 02/22/16   Noland Fordyce, PA-C  ciprofloxacin (CIPRO) 250 MG tablet Take 1 tablet (250 mg total) by mouth every 12 (twelve) hours. 05/02/16 05/05/16  Noland Fordyce, PA-C  CRANBERRY PO Take by mouth daily.    Historical Provider, MD  Cyanocobalamin (VITAMIN B 12 PO) Take by mouth daily.    Historical Provider, MD  Docusate Calcium (STOOL SOFTENER PO) Take by mouth as needed.    Historical Provider, MD  esomeprazole (NEXIUM) 40 MG capsule One tab by mouth at dinner time. 01/26/16   Silverio Decamp, MD  fluconazole (DIFLUCAN) 200 MG tablet Take 1 tab once, may repeat in 3 days if symptoms not improving. 05/02/16   Noland Fordyce, PA-C  fluticasone [facility]) 50 MCG/ACT nasal spray PLACE 2 SPRAYS INTO THE NOSE DAILY 04/24/14   Jade L Breeback, PA-C  loratadine (CLARITIN) 10 MG  tablet Take 10 mg by mouth daily.    Historical Provider, MD  Prenatal Vit-Fe Fumarate-FA (M-VIT PO) Take by mouth daily.    Historical Provider, MD  Probiotic Product (PRO-BIOTIC BLEND PO) Take by mouth daily.    Historical Provider, MD   Meds Ordered and Administered this Visit  Medications - No data to display  BP 126/85 (BP Location: Left Arm)   Pulse 72   Temp 98.1 F (36.7 C) (Oral)   Resp 16   Ht 5' 7.5" (1.715 m)   Wt 213 lb (96.6 kg)   SpO2 98%   BMI 32.87 kg/m  No data found.   Physical Exam  Constitutional: She is oriented to person, place, and time. She appears well-developed and  well-nourished. No distress.  HENT:  Head: Normocephalic and atraumatic.  Mouth/Throat: Oropharynx is clear and moist.  Eyes: EOM are normal.  Neck: Normal range of motion.  Cardiovascular: Normal rate and regular rhythm.   Pulmonary/Chest: Effort normal and breath sounds normal. No respiratory distress. She has no wheezes. She has no rales.  Abdominal: Soft. She exhibits no distension and no mass. There is no tenderness. There is no rebound, no guarding and no CVA tenderness.  Musculoskeletal: Normal range of motion.  Neurological: She is alert and oriented to person, place, and time.  Skin: Skin is warm and dry. She is not diaphoretic.  Psychiatric: She has a normal mood and affect. Her behavior is normal.  Nursing note and vitals reviewed.   Urgent Care Course   Clinical Course    Procedures (including critical care time)  Labs Review Labs Reviewed  POCT URINALYSIS DIP (MANUAL ENTRY) - Abnormal; Notable for the following:       Result Value   Blood, UA trace-intact (*)    Nitrite, UA Positive (*)    Leukocytes, UA large (3+) (*)    All other components within normal limits  URINE CULTURE    Imaging Review No results found.   MDM   1. Recurrent UTI    Hx of UTIs, symptoms similar to prior UTIs. Recent treatment with Keflex but no culture to compare.  UA c/w current UTI. Will send Culture. Will start pt on 3 days of Cipro as pt has been on in the past w/o side effects and currently takes Macrobid as needed after intercourse to help prevent UTIs.  Prescription for diflucan given to hold as she did develop a yeast infection last time she was on antibiotics. Encouraged f/u with PCP in 3-4 days if not improving, and for recurrent UTIs. Patient verbalized understanding and agreement with treatment plan.     Noland Fordyce, PA-C 05/02/16 1404

## 2016-05-02 NOTE — ED Triage Notes (Signed)
Patient presents to [facility] with C/O urgency and hesitancy since Wednesday  History of reoccurring infections

## 2016-05-04 LAB — URINE CULTURE: Colony Count: >=100000

## 2016-05-05 ENCOUNTER — Telehealth: Payer: Self-pay | Admitting: Emergency Medicine

## 2016-05-05 NOTE — Telephone Encounter (Signed)
Feeling better, finished meds

## 2016-05-13 ENCOUNTER — Encounter: Payer: Self-pay | Admitting: Family Medicine

## 2016-05-13 ENCOUNTER — Ambulatory Visit (INDEPENDENT_AMBULATORY_CARE_PROVIDER_SITE_OTHER): Payer: BC Managed Care – PPO | Admitting: Family Medicine

## 2016-05-13 VITALS — BP 126/86 | HR 70 | Wt 210.0 lb

## 2016-05-13 DIAGNOSIS — R300 Dysuria: Secondary | ICD-10-CM | POA: Diagnosis not present

## 2016-05-13 DIAGNOSIS — K2100 Gastro-esophageal reflux disease with esophagitis, without bleeding: Secondary | ICD-10-CM

## 2016-05-13 DIAGNOSIS — I878 Other specified disorders of veins: Secondary | ICD-10-CM | POA: Diagnosis not present

## 2016-05-13 DIAGNOSIS — R319 Hematuria, unspecified: Secondary | ICD-10-CM

## 2016-05-13 DIAGNOSIS — R600 Localized edema: Secondary | ICD-10-CM

## 2016-05-13 LAB — POCT URINALYSIS DIPSTICK
BILIRUBIN UA: NEGATIVE
Glucose, UA: NEGATIVE
KETONES UA: NEGATIVE
LEUKOCYTES UA: NEGATIVE
Nitrite, UA: NEGATIVE
PH UA: 7
PROTEIN UA: NEGATIVE
SPEC GRAV UA: 1.015
Urobilinogen, UA: 0.2

## 2016-05-13 NOTE — Progress Notes (Signed)
Subjective:    CC: leg swelling, bilat  HPI:  GERD- she has been Tapering off her Nexium and now only takes it twice per week on Sundays and Thursdays. She also stopped several of her supplements.  Recurrent UTI - she had a UTI and was treated at [facility]. Then had recurrent 5 weeks later and tx with Cipro.  She feels her sxs are gone but wants urine rechecked to make sure no UTI.    C/O gradual onset of LE edema.  Says wrose at the end of the days. Has noticed some varicose veins.  NO CP, SOB. NO hx of heart problems.  Says her mom wears compression stocking.  No pain in her legs currently. 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    Past medical history, Surgical history, Family history not pertinant except as noted below, Social history, Allergies, and medications have been entered into the medical record, reviewed, and corrections made.   Review of Systems: No fevers, chills, night sweats, weight loss, chest pain, or shortness of breath.   Objective:    General: Well Developed, well nourished, and in no acute distress.  Neuro: Alert and oriented x3, extra-ocular muscles intact, sensation grossly intact.  HEENT: Normocephalic, atraumatic  Skin: Warm and dry, no rashes. Cardiac: Regular rate and rhythm, no murmurs rubs or gallops.   Respiratory: Clear to auscultation bilaterally. Not using accessory muscles, speaking in full sentences. Ext: trace edema ankles bilaterally. Some larger veins in her legs but non bulging. Spider veins near her ankles.     Impression and Recommendations:   GERD - tapering off PPI.  Has stopped her extra supplement and daily ASA she was taking.    Recurrent UTI- UA pos for blood, will send for culture.    Venous stasis - additional labs to rule out other causes but likely venous stasis - discussed compression stocking and elevation of legs. Watch salt intake and work on weight loss.

## 2016-05-14 LAB — CBC WITH DIFFERENTIAL/PLATELET
BASOS ABS: 0 {cells}/uL (ref 0–200)
Basophils Relative: 0 %
EOS ABS: 92 {cells}/uL (ref 15–500)
EOS PCT: 1 %
HCT: 40.2 % (ref 35.0–45.0)
Hemoglobin: 13.6 g/dL (ref 11.7–15.5)
LYMPHS PCT: 34 %
Lymphs Abs: 3128 cells/uL (ref 850–3900)
MCH: 32.6 pg (ref 27.0–33.0)
MCHC: 33.8 g/dL (ref 32.0–36.0)
MCV: 96.4 fL (ref 80.0–100.0)
MONOS PCT: 9 %
MPV: 10.5 fL (ref 7.5–12.5)
Monocytes Absolute: 828 cells/uL (ref 200–950)
NEUTROS PCT: 56 %
Neutro Abs: 5152 cells/uL (ref 1500–7800)
PLATELETS: 294 10*3/uL (ref 140–400)
RBC: 4.17 MIL/uL (ref 3.80–5.10)
RDW: 12.9 % (ref 11.0–15.0)
WBC: 9.2 10*3/uL (ref 3.8–10.8)

## 2016-05-14 LAB — COMPLETE METABOLIC PANEL WITH GFR
ALT: 15 U/L (ref 6–29)
AST: 19 U/L (ref 10–35)
Albumin: 4.7 g/dL (ref 3.6–5.1)
Alkaline Phosphatase: 54 U/L (ref 33–115)
BILIRUBIN TOTAL: 0.4 mg/dL (ref 0.2–1.2)
BUN: 9 mg/dL (ref 7–25)
CALCIUM: 9.5 mg/dL (ref 8.6–10.2)
CHLORIDE: 103 mmol/L (ref 98–110)
CO2: 25 mmol/L (ref 20–31)
CREATININE: 0.73 mg/dL (ref 0.50–1.10)
Glucose, Bld: 86 mg/dL (ref 65–99)
POTASSIUM: 3.9 mmol/L (ref 3.5–5.3)
Sodium: 137 mmol/L (ref 135–146)
Total Protein: 7.3 g/dL (ref 6.1–8.1)

## 2016-05-14 LAB — URINE CULTURE: ORGANISM ID, BACTERIA: NO GROWTH

## 2016-05-14 LAB — TSH: TSH: 3.42 m[IU]/L

## 2016-05-14 NOTE — Progress Notes (Signed)
All labs are normal. 

## 2016-08-23 ENCOUNTER — Telehealth: Payer: Self-pay | Admitting: Family Medicine

## 2016-08-23 NOTE — Telephone Encounter (Signed)
Shannon May called into triage asking if she could get phenergan called into a pharmacy in [place], [place].  She is in [place] leaving today and woke up this morning with diarrhea.  She would like for you to call her once you're able to call in the medication.  She is scheduled to check-out at 11am from the hotel.  Her call back number is [phone].

## 2016-08-23 NOTE — Telephone Encounter (Signed)
Spoke with PCP, recommended OTC lomotil and dramamine. Pt advised. Verbalized understanding.

## 2016-10-20 ENCOUNTER — Ambulatory Visit: Payer: BC Managed Care – PPO

## 2016-10-26 ENCOUNTER — Encounter: Payer: Self-pay | Admitting: Family Medicine

## 2016-10-26 ENCOUNTER — Ambulatory Visit (INDEPENDENT_AMBULATORY_CARE_PROVIDER_SITE_OTHER): Payer: BC Managed Care – PPO | Admitting: Family Medicine

## 2016-10-26 VITALS — BP 111/63 | HR 78 | Temp 98.2°F | Ht 68.0 in | Wt 211.0 lb

## 2016-10-26 DIAGNOSIS — J018 Other acute sinusitis: Secondary | ICD-10-CM

## 2016-10-26 DIAGNOSIS — Z23 Encounter for immunization: Secondary | ICD-10-CM

## 2016-10-26 DIAGNOSIS — J3089 Other allergic rhinitis: Secondary | ICD-10-CM

## 2016-10-26 NOTE — Progress Notes (Signed)
   Subjective:    Patient ID: [name], female    DOB: 10-15-1970, 46 y.o.   MRN: [id number]  HPI Patient comes in today complaining of sore throat 4 days. It started over the weekend. She said for couple weeks actually had the sensation that there was some things stuck in the back of her throat but that eventually went away. She has known dust mite and mold allergies. She was tested years ago. She more recently started her Flonase and increased her dose to 2 sprays in each nostril and increase Claritin to twice a day. She had a low-grade temperature over the weekend but nothing this week so far. Right ear has pressure. Feels like her secretions are just very thickened, getting stuck in the back of her throat.   Review of Systems     Objective:   Physical Exam  Constitutional: She is oriented to person, place, and time. She appears well-developed and well-nourished.  HENT:  Head: Normocephalic and atraumatic.  Right Ear: External ear normal.  Left Ear: External ear normal.  Nose: Nose normal.  Mouth/Throat: Oropharynx is clear and moist.  TMs and canals are clear.   Eyes: Conjunctivae and EOM are normal. Pupils are equal, round, and reactive to light.  Neck: Neck supple. No thyromegaly present.  Cardiovascular: Normal rate, regular rhythm and normal heart sounds.   Pulmonary/Chest: Effort normal and breath sounds normal. She has no wheezes.  Lymphadenopathy:    She has no cervical adenopathy.  Neurological: She is alert and oriented to person, place, and time.  Skin: Skin is warm and dry.  Psychiatric: She has a normal mood and affect.          Assessment & Plan:  AR/Sinusitis - I suspect that her throat discomfort and right ear pressures related to allergic rhinitis. I would like her to continue her Flonase but actually stop her Claritin and any other antihistamine which sometimes can be drying and can actually thicken secretions. I recommend that she start nasal saline  rinse and gave some recommendations for this or even an Nettie pot. If she's not feeling at least some better by the end of the week and consider treatment for bacterial sinusitis.  Call if not better by end the of week.

## 2016-12-08 ENCOUNTER — Encounter: Payer: Self-pay | Admitting: Family Medicine

## 2016-12-08 ENCOUNTER — Ambulatory Visit (INDEPENDENT_AMBULATORY_CARE_PROVIDER_SITE_OTHER): Payer: BC Managed Care – PPO | Admitting: Family Medicine

## 2016-12-08 VITALS — BP 146/83 | HR 75 | Ht 68.0 in | Wt 209.0 lb

## 2016-12-08 DIAGNOSIS — R0602 Shortness of breath: Secondary | ICD-10-CM | POA: Diagnosis not present

## 2016-12-08 DIAGNOSIS — I10 Essential (primary) hypertension: Secondary | ICD-10-CM

## 2016-12-08 DIAGNOSIS — M25473 Effusion, unspecified ankle: Secondary | ICD-10-CM

## 2016-12-08 DIAGNOSIS — R42 Dizziness and giddiness: Secondary | ICD-10-CM

## 2016-12-08 MED ORDER — HYDROCHLOROTHIAZIDE 12.5 MG PO CAPS: 12.5000 mg | ORAL_CAPSULE | Freq: Every day | ORAL | 0 refills | Status: DC

## 2016-12-08 NOTE — Progress Notes (Signed)
Subjective:    Patient ID: Shannon May, female    DOB: 08-25-1971, 46 y.o.   MRN: [id number]  HPI 46 year old female comes in today complaining of episodes of elevated blood pressure as well as some shortness of breath and intermittent dizziness.  She wonders if she is anxious as she has a lot going on and she has been in a [facility] D program. She has been trying to walk more.  Mom developed hypertension in her 4s.    She also complains of feeling nervous and on edge more than half the days and feeling like she is worrying excessively. She also complains of a little bit of trouble relaxing and restlessness several days of the week and feeling afraid that something awful might happen nearly every day.  Review of Systems  Started noticing some ankle swelling so has been wearing compression stocking.     BP (!) 146/83   Pulse 75   Ht 5\' 8"  (1.727 m)   Wt 209 lb (94.8 kg)   SpO2 100%   BMI 31.78 kg/m     Allergies  Allergen Reactions  . Antihistamines, Chlorpheniramine-Type Shortness Of Breath    Makes heart race  . Azithromycin Nausea And Vomiting  . Sudafed [Pseudoephedrine Hcl]     shaking  . Sulfamethoxazole Nausea And Vomiting  . Sulfonamide Derivatives   . Erythromycin Nausea Only    No past medical history on file.  Past Surgical History:  Procedure Laterality Date  . DILATION AND CURETTAGE OF UTERUS      Social History   Social History  . Marital status: Married    Spouse name: Ronalee Belts  . Number of children: 3  . Years of education: Masters   Occupational History  . 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  [place]   Social History Main Topics  . Smoking status: Former Smoker    Types: Cigarettes  . Smokeless tobacco: Never Used  . Alcohol use 1.8 oz/week    3 Glasses of wine per week  . Drug use: No  . Sexual activity: Yes    Partners: Male   Other Topics Concern  . Not on file   Social History Narrative   [place] for exercise.  Used  to run for exerise. 2 caffeine drinks per day.     Family History  Problem Relation Age of Onset  . Colon cancer Maternal Grandfather   . Melanoma Paternal Grandfather   . Heart attack Maternal Grandmother   . Hypertension Mother   . Hyperlipidemia Father     Outpatient Encounter Prescriptions as of 12/08/2016  Medication Sig  . Docusate Calcium (STOOL SOFTENER PO) Take by mouth as needed.  Marland Kitchen FIBER PO Take by mouth.  . fluticasone (FLONASE) 50 MCG/ACT nasal spray PLACE 2 SPRAYS INTO THE NOSE DAILY  . loratadine (CLARITIN) 10 MG tablet Take 10 mg by mouth daily.  . Multiple Vitamins-Minerals (MULTIVITAMIN WOMEN PO) Take 1 capsule by mouth daily.  . Probiotic Product (PRO-BIOTIC BLEND PO) Take by mouth daily.  . hydrochlorothiazide (MICROZIDE) 12.5 MG capsule Take 1 capsule (12.5 mg total) by mouth daily.   No facility-administered encounter medications on file as of 12/08/2016.          Objective:   Physical Exam  Constitutional: She is oriented to person, place, and time. She appears well-developed and well-nourished.  HENT:  Head: Normocephalic and atraumatic.  Right Ear: External ear normal.  Left Ear: External  ear normal.  Nose: Nose normal.  Mouth/Throat: Oropharynx is clear and moist.  TMs and canals are clear.   Eyes: Conjunctivae and EOM are normal. Pupils are equal, round, and reactive to light.  Neck: Neck supple. No thyromegaly present.  Cardiovascular: Normal rate, regular rhythm and normal heart sounds.   Pulmonary/Chest: Effort normal and breath sounds normal. She has no wheezes.  Lymphadenopathy:    She has no cervical adenopathy.  Neurological: She is alert and oriented to person, place, and time.  Skin: Skin is warm and dry.  1+ ankle edema bilaterally   Psychiatric: She has a normal mood and affect.        Assessment & Plan:  Abnormal/elevated BPs - EKG today shows rate of 72 bpm, normal sinus rhythm with normal axis and no acute ST-T wave  changes.Discussed options. I think should be great candidate for HCTZ. Would help lower her blood pressure in addition to helping with her ankle swelling.  SOB/Dizziness- Orthostatics were normal.  EKG is normal as well.    Anxiety - GAD 7 score of 13 today, Consistent with moderate anxiety, rates her sxs as somewhat difficulty.  We discussed the possibility of referral for therapy counseling. Versus possible medication versus just continuing to keep an eye on things.  Lateral ankle edema-will check for electrolyte disturbance and thyroid disorder. Also check urinalysis for protein loss. We'll start HCTZ. Continue with compression stockings.

## 2016-12-09 LAB — COMPLETE METABOLIC PANEL WITH GFR
ALT: 16 U/L (ref 6–29)
AST: 18 U/L (ref 10–35)
Albumin: 4.4 g/dL (ref 3.6–5.1)
Alkaline Phosphatase: 59 U/L (ref 33–115)
BILIRUBIN TOTAL: 0.8 mg/dL (ref 0.2–1.2)
BUN: 11 mg/dL (ref 7–25)
CO2: 26 mmol/L (ref 20–31)
Calcium: 9.4 mg/dL (ref 8.6–10.2)
Chloride: 104 mmol/L (ref 98–110)
Creat: 0.77 mg/dL (ref 0.50–1.10)
GLUCOSE: 102 mg/dL — AB (ref 65–99)
POTASSIUM: 4 mmol/L (ref 3.5–5.3)
SODIUM: 138 mmol/L (ref 135–146)
TOTAL PROTEIN: 7.2 g/dL (ref 6.1–8.1)

## 2016-12-09 LAB — CBC WITH DIFFERENTIAL/PLATELET
BASOS ABS: 0 {cells}/uL (ref 0–200)
Basophils Relative: 0 %
EOS PCT: 1 %
Eosinophils Absolute: 73 cells/uL (ref 15–500)
HEMATOCRIT: 43.2 % (ref 35.0–45.0)
HEMOGLOBIN: 14.8 g/dL (ref 11.7–15.5)
Lymphocytes Relative: 29 %
Lymphs Abs: 2117 cells/uL (ref 850–3900)
MCH: 32.5 pg (ref 27.0–33.0)
MCHC: 34.3 g/dL (ref 32.0–36.0)
MCV: 94.9 fL (ref 80.0–100.0)
MONO ABS: 657 {cells}/uL (ref 200–950)
MPV: 10.4 fL (ref 7.5–12.5)
Monocytes Relative: 9 %
NEUTROS ABS: 4453 {cells}/uL (ref 1500–7800)
NEUTROS PCT: 61 %
Platelets: 285 10*3/uL (ref 140–400)
RBC: 4.55 MIL/uL (ref 3.80–5.10)
RDW: 12.9 % (ref 11.0–15.0)
WBC: 7.3 10*3/uL (ref 3.8–10.8)

## 2016-12-09 LAB — LIPID PANEL W/REFLEX DIRECT LDL
CHOL/HDL RATIO: 4.1 ratio (ref <5.0)
Cholesterol: 225 mg/dL — ABNORMAL HIGH (ref <200)
HDL: 55 mg/dL (ref >50)
LDL-CHOLESTEROL: 152 mg/dL — AB
Non-HDL Cholesterol (Calc): 170 mg/dL — ABNORMAL HIGH (ref <130)
TRIGLYCERIDES: 81 mg/dL (ref <150)

## 2016-12-09 LAB — TSH: TSH: 3.73 mIU/L

## 2016-12-10 LAB — URINALYSIS
Bilirubin Urine: NEGATIVE
Glucose, UA: NEGATIVE
HGB URINE DIPSTICK: NEGATIVE
KETONES UR: NEGATIVE
LEUKOCYTES UA: NEGATIVE
Nitrite: NEGATIVE
PROTEIN: NEGATIVE
Specific Gravity, Urine: 1.014 (ref 1.001–1.035)
pH: 5.5 (ref 5.0–8.0)

## 2016-12-14 NOTE — Addendum Note (Signed)
Addended by: Huel Cote on: 12/14/2016 10:38 AM   Modules accepted: Orders

## 2016-12-29 ENCOUNTER — Ambulatory Visit (INDEPENDENT_AMBULATORY_CARE_PROVIDER_SITE_OTHER): Payer: BC Managed Care – PPO | Admitting: Physician Assistant

## 2016-12-29 VITALS — BP 123/61 | HR 78 | Ht 67.5 in | Wt 212.0 lb

## 2016-12-29 DIAGNOSIS — I10 Essential (primary) hypertension: Secondary | ICD-10-CM

## 2016-12-29 MED ORDER — HYDROCHLOROTHIAZIDE 12.5 MG PO CAPS: 12.5000 mg | ORAL_CAPSULE | Freq: Every day | ORAL | 0 refills | Status: DC

## 2016-12-29 MED ORDER — HYDROCHLOROTHIAZIDE 12.5 MG PO CAPS: 12.5000 mg | ORAL_CAPSULE | Freq: Every day | ORAL | 1 refills | Status: DC

## 2016-12-29 NOTE — Progress Notes (Signed)
Patient came into clinic today for BP check. Pt reports she was started on HCTZ at last OV and has been very happy with this Rx. Reports the ankle edema is gone, and her BP is remaining in normal range. Pt also reports she has seen a decline in her panic attacks/tightness in her chest. Along with Rx, Pt has also made some dietary changes and no longer eats fried foods. Pt does need a refill today, will send over.  While in office, per PCP request- [employer] form completed. Pt screen as a low risk of OSA. Will place form in PCP's box for review.   Refilled HCTZ for 3 months. Will allow PCP to review [employer].  Iran Planas PA-C

## 2017-01-03 ENCOUNTER — Telehealth: Payer: Self-pay | Admitting: Family Medicine

## 2017-01-03 NOTE — Telephone Encounter (Signed)
Call pt: her screening questionnair for sleep apnea was negative.  Just wanted to let her know.   Beatrice Lecher, MD

## 2017-01-04 NOTE — Telephone Encounter (Signed)
Pt informed.Shannon May  

## 2017-02-24 LAB — HM PAP SMEAR: HM Pap smear: NEGATIVE

## 2017-03-19 ENCOUNTER — Encounter: Payer: Self-pay | Admitting: Family Medicine

## 2017-04-25 ENCOUNTER — Encounter: Payer: Self-pay | Admitting: Family Medicine

## 2017-04-25 ENCOUNTER — Ambulatory Visit (INDEPENDENT_AMBULATORY_CARE_PROVIDER_SITE_OTHER): Payer: BC Managed Care – PPO | Admitting: Family Medicine

## 2017-04-25 VITALS — BP 138/78 | HR 66 | Wt 215.0 lb

## 2017-04-25 DIAGNOSIS — L244 Irritant contact dermatitis due to drugs in contact with skin: Secondary | ICD-10-CM

## 2017-04-25 DIAGNOSIS — L0390 Cellulitis, unspecified: Secondary | ICD-10-CM

## 2017-04-25 DIAGNOSIS — Z8614 Personal history of Methicillin resistant Staphylococcus aureus infection: Secondary | ICD-10-CM

## 2017-04-25 MED ORDER — DOXYCYCLINE HYCLATE 100 MG PO TABS: 100.0000 mg | ORAL_TABLET | Freq: Two times a day (BID) | ORAL | 0 refills | Status: DC

## 2017-04-25 NOTE — Progress Notes (Signed)
   Subjective:    Patient ID: Shannon May, female    DOB: 1971/03/10, 46 y.o.   MRN: [id number]  HPI 46 year old female comes in today with a erythematous area of the right lower abdomen. She says it's been there for about a week and a half. Initially had a head on it. She has had a prior history of MRSA and so was concerned that it could be recurring as it looks similar to a lesion she had had previously. Starting last Tuesday approximately 7 days ago she started applying Neosporin and covering it with a Band-Aid. She's been applying Neosporin 4 times a day. She's been changing the bandages pretty regularly. No fevers chills or sweats. It has not drained.   Review of Systems     Objective:   Physical Exam  Constitutional: She is oriented to person, place, and time. She appears well-developed and well-nourished.  HENT:  Head: Normocephalic and atraumatic.  Eyes: Conjunctivae and EOM are normal.  Cardiovascular: Normal rate.   Pulmonary/Chest: Effort normal.  Neurological: She is alert and oriented to person, place, and time.  Skin: Skin is dry. Rash noted. No pallor.  Right lower quadrant area she has a erythematous area with irregular border and some of the skin of his gets peeled off. Is it a little bit of induration on the lateral edge but no nodularity to it. No open wound. No active drainage. It's clean dry and intact. Moderate measures approximately 2 x 6 cm  Psychiatric: She has a normal mood and affect. Her behavior is normal.  Vitals reviewed.           Assessment & Plan:  Erythematous skin lesion on the right lower quadrant abdomen-at this point is a little confusing whether or not this is early cellulitis versus a contact dermatitis from the Neosporin in the Band-Aids. I do not palpate any significant nodularity to indicate an abscess that needs to be drained.  Go ahead and place her on doxycycline since she does have a history of MRSA. I do think that the bandages  have actually peeled some of the skin off. At this point I don't want her to apply anything on the area. Just keep a piece of gauze between her underwear and her skin without any tape or bandaging and completely antibiotic. If not significantly better by the end of the week and please give [place] a call back.

## 2017-06-30 ENCOUNTER — Encounter: Payer: Self-pay | Admitting: Family Medicine

## 2017-06-30 ENCOUNTER — Ambulatory Visit (INDEPENDENT_AMBULATORY_CARE_PROVIDER_SITE_OTHER): Payer: BC Managed Care – PPO | Admitting: Family Medicine

## 2017-06-30 VITALS — BP 136/82 | HR 91 | Ht 68.0 in | Wt 215.0 lb

## 2017-06-30 DIAGNOSIS — Z23 Encounter for immunization: Secondary | ICD-10-CM

## 2017-06-30 DIAGNOSIS — I10 Essential (primary) hypertension: Secondary | ICD-10-CM

## 2017-06-30 DIAGNOSIS — M549 Dorsalgia, unspecified: Secondary | ICD-10-CM

## 2017-06-30 DIAGNOSIS — F439 Reaction to severe stress, unspecified: Secondary | ICD-10-CM

## 2017-06-30 DIAGNOSIS — M542 Cervicalgia: Secondary | ICD-10-CM | POA: Diagnosis not present

## 2017-06-30 MED ORDER — HYDROCHLOROTHIAZIDE 12.5 MG PO CAPS: 12.5000 mg | ORAL_CAPSULE | Freq: Every day | ORAL | 1 refills | Status: DC

## 2017-06-30 NOTE — Progress Notes (Signed)
   Subjective:    Patient ID: Shannon May, female    DOB: 08/15/1971, 46 y.o.   MRN: [id number]  HPI  Hypertension- Pt denies chest pain, SOB, dizziness, or heart palpitations.  Taking meds as directed w/o problems.  Denies medication side effects.    She also wanted to discuss neck and back pain.  She gets a lot of tension in these areas and says that she really benefits from massage therapy on a regular basis.  Is been extremely helpful for her.  She would like to get this covered under her HSA account and flexible spending if at all possible.  It also provides a lot of stress relief for her.  She is still been under a lot of stress with working full-time and going to school.  She is working on her thesis right now.  She says she may come back in December or January to discuss medication for the next semester.  She is just worried that her anxiety levels will increase significantly.  She does not want to affect her blood pressure or her heart.  Review of Systems     Objective:   Physical Exam  Constitutional: She is oriented to person, place, and time. She appears well-developed and well-nourished.  HENT:  Head: Normocephalic and atraumatic.  Cardiovascular: Normal rate, regular rhythm and normal heart sounds.   Pulmonary/Chest: Effort normal and breath sounds normal.  Neurological: She is alert and oriented to person, place, and time.  Skin: Skin is warm and dry.  Psychiatric: She has a normal mood and affect. Her behavior is normal.        Assessment & Plan:  HTN -controlled.  Continue current regimen.  Follow-up in 6 months.  Neck/back pain-benefits from physical therapy.  We will be happy to provide a letter supporting this.  It does think this can be extremely beneficial for musculoskeletal pain and for stress relief.  Stress-I be happy to see her back in December or January we can certainly discuss options.  She would likely benefit from something like Zoloft.

## 2017-08-12 ENCOUNTER — Ambulatory Visit (INDEPENDENT_AMBULATORY_CARE_PROVIDER_SITE_OTHER): Payer: BC Managed Care – PPO | Admitting: Family Medicine

## 2017-08-12 ENCOUNTER — Encounter: Payer: Self-pay | Admitting: Family Medicine

## 2017-08-12 VITALS — BP 117/66 | HR 76 | Ht 68.0 in | Wt 214.0 lb

## 2017-08-12 DIAGNOSIS — F439 Reaction to severe stress, unspecified: Secondary | ICD-10-CM | POA: Diagnosis not present

## 2017-08-12 MED ORDER — SERTRALINE HCL 50 MG PO TABS: ORAL_TABLET | ORAL | 1 refills | Status: DC

## 2017-08-12 NOTE — Progress Notes (Signed)
   Subjective:    Patient ID: Shannon May, female    DOB: July 25, 1971, 46 y.o.   MRN: [id number]  HPI 46 year old female comes in today to discuss further treatment of her increased stress levels.  When I last saw her she was under a lot of stress being in school again.  She had said that she might want to consider taking a medication if it might help her for the following semester.    She is no longer having chest pain and finished her school paper on Friday. She does have her comprehensive exams in January.  She is a bit nervous about doing the exams that she does have some performances coming up in the next couple weeks.  She still just feels tense and stressed.  Looking back she thinks she may have had some borderline panic attacks in the recent past.   Review of Systems     Objective:   Physical Exam  Constitutional: She is oriented to person, place, and time. She appears well-developed and well-nourished.  HENT:  Head: Normocephalic and atraumatic.  Cardiovascular: Normal rate, regular rhythm and normal heart sounds.  Pulmonary/Chest: Effort normal and breath sounds normal.  Neurological: She is alert and oriented to person, place, and time.  Skin: Skin is warm and dry.  Psychiatric: She has a normal mood and affect. Her behavior is normal.        Assessment & Plan:  Anxiety -discussed options.  We will go ahead and start Zoloft.  1 about potential side effects.  Follow-up in 3 weeks.  Time spent 20 min , > 50% spent counseling about aniety.

## 2017-08-14 ENCOUNTER — Encounter: Payer: Self-pay | Admitting: Family Medicine

## 2017-09-05 ENCOUNTER — Other Ambulatory Visit: Payer: Self-pay | Admitting: *Deleted

## 2017-09-05 MED ORDER — SERTRALINE HCL 50 MG PO TABS: 50.0000 mg | ORAL_TABLET | Freq: Every day | ORAL | 1 refills | Status: DC

## 2017-09-12 ENCOUNTER — Encounter: Payer: Self-pay | Admitting: Family Medicine

## 2017-09-12 ENCOUNTER — Ambulatory Visit: Payer: BC Managed Care – PPO | Admitting: Family Medicine

## 2017-09-12 VITALS — BP 134/75 | HR 72 | Ht 67.0 in | Wt 215.0 lb

## 2017-09-12 DIAGNOSIS — M542 Cervicalgia: Secondary | ICD-10-CM

## 2017-09-12 DIAGNOSIS — F419 Anxiety disorder, unspecified: Secondary | ICD-10-CM | POA: Diagnosis not present

## 2017-09-12 NOTE — Progress Notes (Signed)
Subjective:    CC: Mood  HPI:  Shannon May is a 47 year old female who comes in today to follow-up for mood.  When I saw her right before the holidays she was under a lot of stress.  Her gad 7 score was 17 at the time as she is in school try to finish up a program.  She went home and decided not to take the Zoloft which I had prescribed.  She said decided to commit to really working on a healthy diet, regular exercise and reducing her stress.  Is also been on holiday though in her program will went back up next week.    In regards to her neck and back pain we had written a letter for her to be able to get massage therapy.  She needs a letter updated to say why she would benefit and the license number included on the form.  Past medical history, Surgical history, Family history not pertinant except as noted below, Social history, Allergies, and medications have been entered into the medical record, reviewed, and corrections made.   Review of Systems: No fevers, chills, night sweats, weight loss, chest pain, or shortness of breath.   Objective:    General: Well Developed, well nourished, and in no acute distress.  Neuro: Alert and oriented x3, extra-ocular muscles intact, sensation grossly intact.  HEENT: Normocephalic, atraumatic  Skin: Warm and dry, no rashes. Cardiac: Regular rate and rhythm, no murmurs rubs or gallops, no lower extremity edema.  Respiratory: Clear to auscultation bilaterally. Not using accessory muscles, speaking in full sentences.   Impression and Recommendations:   Anxiety-gad 7 score of 5 today down significantly from previous.  If at any point she feels like her stress levels are going back up significantly and feels like she is unable to control them then encouraged her to go ahead and start the Zoloft.  Otherwise we will just continue to monitor.  Neck and back pain-letter rewritten. Given to her today to get her massage covered under her flex spending.    Labs  printed.

## 2017-09-13 ENCOUNTER — Encounter: Payer: Self-pay | Admitting: Family Medicine

## 2017-09-13 LAB — BASIC METABOLIC PANEL
BUN: 10 mg/dL (ref 7–25)
CALCIUM: 9.7 mg/dL (ref 8.6–10.2)
CO2: 28 mmol/L (ref 20–32)
Chloride: 99 mmol/L (ref 98–110)
Creat: 0.71 mg/dL (ref 0.50–1.10)
GLUCOSE: 94 mg/dL (ref 65–99)
Potassium: 3.7 mmol/L (ref 3.5–5.3)
Sodium: 137 mmol/L (ref 135–146)

## 2017-09-13 NOTE — Progress Notes (Signed)
All labs are normal. 

## 2017-11-08 ENCOUNTER — Ambulatory Visit (INDEPENDENT_AMBULATORY_CARE_PROVIDER_SITE_OTHER): Payer: BC Managed Care – PPO | Admitting: Family Medicine

## 2017-11-08 ENCOUNTER — Encounter: Payer: Self-pay | Admitting: Family Medicine

## 2017-11-08 ENCOUNTER — Telehealth: Payer: Self-pay | Admitting: Family Medicine

## 2017-11-08 DIAGNOSIS — Z0282 Encounter for adoption services: Secondary | ICD-10-CM | POA: Diagnosis not present

## 2017-11-08 DIAGNOSIS — Z111 Encounter for screening for respiratory tuberculosis: Secondary | ICD-10-CM | POA: Diagnosis not present

## 2017-11-08 DIAGNOSIS — Z0000 Encounter for general adult medical examination without abnormal findings: Secondary | ICD-10-CM | POA: Diagnosis not present

## 2017-11-08 DIAGNOSIS — I10 Essential (primary) hypertension: Secondary | ICD-10-CM | POA: Diagnosis not present

## 2017-11-08 HISTORY — DX: Essential (primary) hypertension: I10

## 2017-11-08 NOTE — Progress Notes (Signed)
Subjective:     Shannon May is a 47 y.o. female and is here for a comprehensive physical exam. The patient reports no problems.  She also has some paperwork for [employer] services to be completed in regards to her overall health.  She will need a 5 panel urine drug screen as well as a TB skin test placed.  Social History   Socioeconomic History  . Marital status: Married    Spouse name: Ronalee Belts  . Number of children: 3  . Years of education: Masters  . Highest education level: Not on file  Social Needs  . Financial resource strain: Not on file  . Food insecurity - worry: Not on file  . Food insecurity - inability: Not on file  . Transportation needs - medical: Not on file  . Transportation needs - non-medical: Not on file  Occupational History  . Occupation: Education officer, museum    Comment: [employer]    Employer: [employer]  Tobacco Use  . Smoking status: Former Smoker    Types: Cigarettes  . Smokeless tobacco: Never Used  Substance and Sexual Activity  . Alcohol use: Yes    Alcohol/week: 1.8 oz    Types: 3 Glasses of wine per week  . Drug use: No  . Sexual activity: Yes    Partners: Male  Other Topics Concern  . Not on file  Social History Narrative   Treadmill 5 days per week 2 caffeine drinks per day.    Health Maintenance  Topic Date Due  . HIV Screening  03/21/1984  . TETANUS/TDAP  05/09/2018  . PAP SMEAR  02/25/2020  . INFLUENZA VACCINE  Completed    The following portions of the patient's history were reviewed and updated as appropriate: allergies, current medications, past family history, past medical history, past social history, past surgical history and problem list.  Review of Systems A comprehensive review of systems was negative.   Objective:    BP 124/63   Pulse 74   Ht 5\' 7"  (1.702 m)   Wt 217 lb (98.4 kg)   SpO2 100%   BMI 33.99 kg/m  General appearance: alert, cooperative and appears stated age Head: Normocephalic,  without obvious abnormality, atraumatic Eyes: conj clear, EOMI, PEERLA Ears: normal TM's and external ear canals both ears Nose: Nares normal. Septum midline. Mucosa normal. No drainage or sinus tenderness. Throat: lips, mucosa, and tongue normal; teeth and gums normal Neck: no adenopathy, no carotid bruit, no JVD, supple, symmetrical, trachea midline and thyroid not enlarged, symmetric, no tenderness/mass/nodules Back: symmetric, no curvature. ROM normal. No CVA tenderness. Lungs: clear to auscultation bilaterally Heart: regular rate and rhythm, S1, S2 normal, no murmur, click, rub or gallop Abdomen: soft, non-tender; bowel sounds normal; no masses,  no organomegaly Extremities: extremities normal, atraumatic, no cyanosis or edema Pulses: 2+ and symmetric Skin: Skin color, texture, turgor normal. No rashes or lesions Lymph nodes: Cervical adenopathy: nl and Supraclavicular adenopathy: nl Neurologic: Alert and oriented X 3, normal strength and tone. Normal symmetric reflexes. Normal coordination and gait    Assessment:    Healthy female exam.     Plan:     See After Visit Summary for Counseling Recommendations   Keep up a regular exercise program and make sure you are eating a healthy diet Try to eat 4 servings of dairy a day, or if you are lactose intolerant take a calcium with vitamin D daily.  Your vaccines are up to date.  Pap smear  and mammo is up to date.   Forms to be complete.  TB skin test completed.

## 2017-11-08 NOTE — Patient Instructions (Addendum)

## 2017-11-08 NOTE — Telephone Encounter (Signed)
They will send via mychart.Marland KitchenMarland KitchenElouise May, [name]

## 2017-11-08 NOTE — Telephone Encounter (Signed)
Please call [facility] for last colonoscopy.

## 2017-11-10 ENCOUNTER — Ambulatory Visit (INDEPENDENT_AMBULATORY_CARE_PROVIDER_SITE_OTHER): Payer: BC Managed Care – PPO | Admitting: Family Medicine

## 2017-11-10 ENCOUNTER — Other Ambulatory Visit: Payer: Self-pay | Admitting: *Deleted

## 2017-11-10 VITALS — BP 131/73 | HR 73 | Temp 98.4°F | Resp 16 | Wt 217.7 lb

## 2017-11-10 DIAGNOSIS — Z111 Encounter for screening for respiratory tuberculosis: Secondary | ICD-10-CM | POA: Diagnosis not present

## 2017-11-10 DIAGNOSIS — I10 Essential (primary) hypertension: Secondary | ICD-10-CM

## 2017-11-10 DIAGNOSIS — Z0000 Encounter for general adult medical examination without abnormal findings: Secondary | ICD-10-CM

## 2017-11-10 LAB — DRUG ABUSE PANEL 10-50 NO CONF, U
AMPHETAMINES (1000 NG/ML SCRN): NEGATIVE
BARBITURATES: NEGATIVE
BENZODIAZEPINES: NEGATIVE
COCAINE METABOLITES: NEGATIVE
MARIJUANA MET (50 NG/ML SCRN): NEGATIVE
METHADONE: NEGATIVE
METHAQUALONE: NEGATIVE
OPIATES: NEGATIVE
PHENCYCLIDINE: NEGATIVE
PROPOXYPHENE: NEGATIVE

## 2017-11-10 LAB — TB SKIN TEST
Induration: 0 mm
TB Skin Test: NEGATIVE

## 2017-11-10 LAB — CBC

## 2017-11-10 LAB — COMPLETE METABOLIC PANEL WITH GFR

## 2017-11-10 LAB — LIPID PANEL

## 2017-11-10 NOTE — Progress Notes (Signed)
HPI: Patient is here for a PPD read.   Assessment and Plan: Results were Negative, 0 mm.

## 2017-11-10 NOTE — Progress Notes (Signed)
Agree with documentation as above.   Catherine Metheney, MD  

## 2017-11-15 ENCOUNTER — Ambulatory Visit: Payer: BC Managed Care – PPO | Admitting: Family Medicine

## 2017-11-15 ENCOUNTER — Encounter: Payer: Self-pay | Admitting: Family Medicine

## 2017-11-15 ENCOUNTER — Ambulatory Visit (INDEPENDENT_AMBULATORY_CARE_PROVIDER_SITE_OTHER): Payer: BC Managed Care – PPO | Admitting: Family Medicine

## 2017-11-15 VITALS — BP 127/84 | HR 74 | Temp 98.1°F | Ht 67.0 in | Wt 218.0 lb

## 2017-11-15 DIAGNOSIS — J0101 Acute recurrent maxillary sinusitis: Secondary | ICD-10-CM | POA: Diagnosis not present

## 2017-11-15 DIAGNOSIS — H61893 Other specified disorders of external ear, bilateral: Secondary | ICD-10-CM

## 2017-11-15 DIAGNOSIS — J329 Chronic sinusitis, unspecified: Secondary | ICD-10-CM

## 2017-11-15 HISTORY — DX: Chronic sinusitis, unspecified: J32.9

## 2017-11-15 MED ORDER — AZITHROMYCIN 250 MG PO TABS: 250.0000 mg | ORAL_TABLET | Freq: Every day | ORAL | 0 refills | Status: DC

## 2017-11-15 MED ORDER — PREDNISONE 10 MG PO TABS: 30.0000 mg | ORAL_TABLET | Freq: Every day | ORAL | 0 refills | Status: DC

## 2017-11-15 NOTE — Patient Instructions (Addendum)
Thank you for coming in today. Continue allergy medications. Use azithromycin.  Take prednisone if not better.  Call or go to the ER if you develop a large red swollen joint with extreme pain or oozing puss.    Sinusitis, Adult Sinusitis is soreness and inflammation of your sinuses. Sinuses are hollow spaces in the bones around your face. Your sinuses are located:  Around your eyes.  In the middle of your forehead.  Behind your nose.  In your cheekbones.  Your sinuses and nasal passages are lined with a stringy fluid (mucus). Mucus normally drains out of your sinuses. When your nasal tissues become inflamed or swollen, the mucus can become trapped or blocked so air cannot flow through your sinuses. This allows bacteria, viruses, and funguses to grow, which leads to infection. Sinusitis can develop quickly and last for 7?10 days (acute) or for more than 12 weeks (chronic). Sinusitis often develops after a cold. What are the causes? This condition is caused by anything that creates swelling in the sinuses or stops mucus from draining, including:  Allergies.  Asthma.  Bacterial or viral infection.  Abnormally shaped bones between the nasal passages.  Nasal growths that contain mucus (nasal polyps).  Narrow sinus openings.  Pollutants, such as chemicals or irritants in the air.  A foreign object stuck in the nose.  A fungal infection. This is rare.  What increases the risk? The following factors may make you more likely to develop this condition:  Having allergies or asthma.  Having had a recent cold or respiratory tract infection.  Having structural deformities or blockages in your nose or sinuses.  Having a weak immune system.  Doing a lot of swimming or diving.  Overusing nasal sprays.  Smoking.  What are the signs or symptoms? The main symptoms of this condition are pain and a feeling of pressure around the affected sinuses. Other symptoms include:  Upper  toothache.  Earache.  Headache.  Bad breath.  Decreased sense of smell and taste.  A cough that may get worse at night.  Fatigue.  Fever.  Thick drainage from your nose. The drainage is often green and it may contain pus (purulent).  Stuffy nose or congestion.  Postnasal drip. This is when extra mucus collects in the throat or back of the nose.  Swelling and warmth over the affected sinuses.  Sore throat.  Sensitivity to light.  How is this diagnosed? This condition is diagnosed based on symptoms, a medical history, and a physical exam. To find out if your condition is acute or chronic, your health care provider may:  Look in your nose for signs of nasal polyps.  Tap over the affected sinus to check for signs of infection.  View the inside of your sinuses using an imaging device that has a light attached (endoscope).  If your health care provider suspects that you have chronic sinusitis, you may also:  Be tested for allergies.  Have a sample of mucus taken from your nose (nasal culture) and checked for bacteria.  Have a mucus sample examined to see if your sinusitis is related to an allergy.  If your sinusitis does not respond to treatment and it lasts longer than 8 weeks, you may have an MRI or CT scan to check your sinuses. These scans also help to determine how severe your infection is. In rare cases, a bone biopsy may be done to rule out more serious types of fungal sinus disease. How is this treated? Treatment  for sinusitis depends on the cause and whether your condition is chronic or acute. If a virus is causing your sinusitis, your symptoms will go away on their own within 10 days. You may be given medicines to relieve your symptoms, including:  Topical nasal decongestants. They shrink swollen nasal passages and let mucus drain from your sinuses.  Antihistamines. These drugs block inflammation that is triggered by allergies. This can help to ease swelling in  your nose and sinuses.  Topical nasal corticosteroids. These are nasal sprays that ease inflammation and swelling in your nose and sinuses.  Nasal saline washes. These rinses can help to get rid of thick mucus in your nose.  If your condition is caused by bacteria, you will be given an antibiotic medicine. If your condition is caused by a fungus, you will be given an antifungal medicine. Surgery may be needed to correct underlying conditions, such as narrow nasal passages. Surgery may also be needed to remove polyps. Follow these instructions at home: Medicines  Take, use, or apply over-the-counter and prescription medicines only as told by your health care provider. These may include nasal sprays.  If you were prescribed an antibiotic medicine, take it as told by your health care provider. Do not stop taking the antibiotic even if you start to feel better. Hydrate and Humidify  Drink enough water to keep your urine clear or pale yellow. Staying hydrated will help to thin your mucus.  Use a cool mist humidifier to keep the humidity level in your home above 50%.  Inhale steam for 10-15 minutes, 3-4 times a day or as told by your health care provider. You can do this in the bathroom while a hot shower is running.  Limit your exposure to cool or dry air. Rest  Rest as much as possible.  Sleep with your head raised (elevated).  Make sure to get enough sleep each night. General instructions  Apply a warm, moist washcloth to your face 3-4 times a day or as told by your health care provider. This will help with discomfort.  Wash your hands often with soap and water to reduce your exposure to viruses and other germs. If soap and water are not available, use hand sanitizer.  Do not smoke. Avoid being around people who are smoking (secondhand smoke).  Keep all follow-up visits as told by your health care provider. This is important. Contact a health care provider if:  You have a  fever.  Your symptoms get worse.  Your symptoms do not improve within 10 days. Get help right away if:  You have a severe headache.  You have persistent vomiting.  You have pain or swelling around your face or eyes.  You have vision problems.  You develop confusion.  Your neck is stiff.  You have trouble breathing. This information is not intended to replace advice given to you by your health care provider. Make sure you discuss any questions you have with your health care provider. Document Released: 08/23/2005 Document Revised: 04/18/2016 Document Reviewed: 06/18/2015 Elsevier Interactive Patient Education  [employer].

## 2017-11-15 NOTE — Progress Notes (Signed)
Shannon May is a 47 y.o. female who presents to [facility] today for fever and rhinorrhea. The patient reports that she began feeling bad on last Friday. She reported sinus pressure and the feeling of fluid in her ears. She developed a fever on Sunday, which has persisted since. She has been taking ibuprofen and tylenol to manage the fever. Additionally, she reports developing a cough today. She feels her disease course is worsening. She states this constellation of symptoms is very similar to previous sinus infections, which she successfully treated with Z-packs prior. She takes Claritin and Flonase daily due to seasonal allergies, but they have provided no relief.     Past Medical History:  Diagnosis Date  . Hypertension 11/08/2017  . Sinusitis 11/15/2017   Past Surgical History:  Procedure Laterality Date  . DILATION AND CURETTAGE OF UTERUS     Social History   Tobacco Use  . Smoking status: Former Smoker    Types: Cigarettes  . Smokeless tobacco: Never Used  Substance Use Topics  . Alcohol use: Yes    Alcohol/week: 1.8 oz    Types: 3 Glasses of wine per week   family history includes Colon cancer in her maternal grandfather; Heart attack in her maternal grandmother; Hyperlipidemia in her father; Hypertension in her mother; Melanoma in her paternal grandfather.  ROS as above:  Medications: Current Outpatient Medications  Medication Sig Dispense Refill  . clobetasol (TEMOVATE) 0.05 % external solution USE DAILY TO SCALP AS NEEDED  2  . Docusate Calcium (STOOL SOFTENER PO) Take by mouth as needed.    Marland Kitchen FIBER PO Take by mouth.    . fluticasone (FLONASE) 50 MCG/ACT nasal spray PLACE 2 SPRAYS INTO THE NOSE DAILY 16 g 1  . hydrochlorothiazide (MICROZIDE) 12.5 MG capsule Take 1 capsule (12.5 mg total) by mouth daily. 90 capsule 1  . loratadine (CLARITIN) 10  MG tablet Take 10 mg by mouth daily.    . Multiple Vitamins-Minerals (MULTIVITAMIN WOMEN PO) Take 1 capsule by mouth daily.    . Probiotic Product (PRO-BIOTIC BLEND PO) Take by mouth daily.    Marland Kitchen azithromycin (ZITHROMAX) 250 MG tablet Take 1 tablet (250 mg total) by mouth daily. Take first 2 tablets together, then 1 every day until finished. 6 tablet 0  . predniSONE (DELTASONE) 10 MG tablet Take 3 tablets (30 mg total) by mouth daily with breakfast. 15 tablet 0   No current facility-administered medications for this visit.    Allergies  Allergen Reactions  . Antihistamines, Chlorpheniramine-Type Shortness Of Breath    Makes heart race  . Sudafed [Pseudoephedrine Hcl]     shaking  . Sulfamethoxazole Nausea And Vomiting  . Sulfonamide Derivatives   . Erythromycin Nausea Only    Health Maintenance Health Maintenance  Topic Date Due  . HIV Screening  03/21/1984  . TETANUS/TDAP  05/09/2018  . PAP SMEAR  02/25/2020  . INFLUENZA VACCINE  Completed     Exam:  BP 127/84   Pulse 74   Temp 98.1 F (36.7 C) (Oral)   Ht 5\' 7"  (1.702 m)   Wt 218 lb (98.9 kg)   BMI 34.14 kg/m  Gen: Well NAD HEENT: EOMI,  MMM, inflamed nasal mucous membranes, drainage down back of throat. Fluid present in both ears. Sinus pain bilaterally with tapping.  Ear canals both had small bumps or bumps present on both sides. The canals are non-erythematous and nontender.The right tympanic  membrane is rthe left is normal Lungs: Normal work of breathing. CTABL Heart: RRR no MRG Abd: NABS, Soft. Nondistended, Nontender Exts: Brisk capillary refill, warm and well perfused.       Assessment and Plan: 47 y.o. female with a history of sinus infections who is presenting with fever, rhinorrhea, and cough. The patient's physical exam and reported symptoms are highly indicative of a sinus infection. The patient also agrees this is similar to previous episodes.   Sinus infection: Treat with azithromycin. If no  improvement, utilize prednisone as well.  Plan for watchful waiting and return to clinic if not improved..  I am not sure the etiology or cause of the nodules or bumps on the ear canals bilaterally. I don't know if this is a simple anatomical variant for Jeanell. Plan for watchful waiting and recheck with PCP in the near future.    No orders of the defined types were placed in this encounter.  Meds ordered this encounter  Medications  . azithromycin (ZITHROMAX) 250 MG tablet    Sig: Take 1 tablet (250 mg total) by mouth daily. Take first 2 tablets together, then 1 every day until finished.    Dispense:  6 tablet    Refill:  0  . predniSONE (DELTASONE) 10 MG tablet    Sig: Take 3 tablets (30 mg total) by mouth daily with breakfast.    Dispense:  15 tablet    Refill:  0     Discussed warning signs or symptoms. Please see discharge instructions. Patient expresses understanding.

## 2017-12-02 LAB — COMPLETE METABOLIC PANEL WITH GFR
AG RATIO: 1.6 (calc) (ref 1.0–2.5)
ALBUMIN MSPROF: 4.2 g/dL (ref 3.6–5.1)
ALT: 14 U/L (ref 6–29)
AST: 16 U/L (ref 10–35)
Alkaline phosphatase (APISO): 63 U/L (ref 33–115)
BUN: 14 mg/dL (ref 7–25)
CALCIUM: 9.4 mg/dL (ref 8.6–10.2)
CO2: 28 mmol/L (ref 20–32)
Chloride: 105 mmol/L (ref 98–110)
Creat: 0.72 mg/dL (ref 0.50–1.10)
GFR, EST AFRICAN AMERICAN: 115 mL/min/{1.73_m2} (ref >=60)
GFR, EST NON AFRICAN AMERICAN: 99 mL/min/{1.73_m2} (ref >=60)
GLUCOSE: 92 mg/dL (ref 65–99)
Globulin: 2.6 g/dL (calc) (ref 1.9–3.7)
Potassium: 4.2 mmol/L (ref 3.5–5.3)
Sodium: 137 mmol/L (ref 135–146)
TOTAL PROTEIN: 6.8 g/dL (ref 6.1–8.1)
Total Bilirubin: 0.4 mg/dL (ref 0.2–1.2)

## 2017-12-02 LAB — CBC
HCT: 37.3 % (ref 35.0–45.0)
HEMOGLOBIN: 13.2 g/dL (ref 11.7–15.5)
MCH: 33.2 pg — AB (ref 27.0–33.0)
MCHC: 35.4 g/dL (ref 32.0–36.0)
MCV: 93.7 fL (ref 80.0–100.0)
MPV: 11.1 fL (ref 7.5–12.5)
Platelets: 308 10*3/uL (ref 140–400)
RBC: 3.98 10*6/uL (ref 3.80–5.10)
RDW: 11.8 % (ref 11.0–15.0)
WBC: 6.7 10*3/uL (ref 3.8–10.8)

## 2017-12-02 LAB — LIPID PANEL
CHOL/HDL RATIO: 3.8 (calc) (ref <5.0)
Cholesterol: 196 mg/dL (ref <200)
HDL: 52 mg/dL (ref >50)
LDL CHOLESTEROL (CALC): 127 mg/dL — AB
Non-HDL Cholesterol (Calc): 144 mg/dL (calc) — ABNORMAL HIGH (ref <130)
Triglycerides: 71 mg/dL (ref <150)

## 2018-01-08 ENCOUNTER — Other Ambulatory Visit: Payer: Self-pay

## 2018-01-08 ENCOUNTER — Emergency Department
Admission: EM | Admit: 2018-01-08 | Discharge: 2018-01-08 | Disposition: A | Discharge status: Home / Self Care | Payer: BC Managed Care – PPO | Source: Home / Self Care

## 2018-01-08 ENCOUNTER — Emergency Department (INDEPENDENT_AMBULATORY_CARE_PROVIDER_SITE_OTHER): Payer: BC Managed Care – PPO

## 2018-01-08 DIAGNOSIS — R05 Cough: Secondary | ICD-10-CM

## 2018-01-08 DIAGNOSIS — J209 Acute bronchitis, unspecified: Secondary | ICD-10-CM | POA: Diagnosis not present

## 2018-01-08 MED ORDER — HYDROCODONE-HOMATROPINE 5-1.5 MG/5ML PO SYRP: 5.0000 mL | ORAL_SOLUTION | Freq: Four times a day (QID) | ORAL | 0 refills | Status: DC | PRN

## 2018-01-08 MED ORDER — AMOXICILLIN-POT CLAVULANATE 875-125 MG PO TABS: 1.0000 | ORAL_TABLET | Freq: Two times a day (BID) | ORAL | 0 refills | Status: DC

## 2018-01-08 MED ORDER — ALBUTEROL SULFATE (2.5 MG/3ML) 0.083% IN NEBU
2.5000 mg | INHALATION_SOLUTION | Freq: Once | RESPIRATORY_TRACT | Status: AC
  Administered 2018-01-08: 2.5 mg via RESPIRATORY_TRACT

## 2018-01-08 MED ORDER — ALBUTEROL SULFATE HFA 108 (90 BASE) MCG/ACT IN AERS: 2.0000 | INHALATION_SPRAY | RESPIRATORY_TRACT | 1 refills | Status: DC | PRN

## 2018-01-08 MED ORDER — FLUCONAZOLE 150 MG PO TABS: 150.0000 mg | ORAL_TABLET | Freq: Once | ORAL | 0 refills | Status: AC

## 2018-01-08 NOTE — ED Triage Notes (Signed)
Pt c/o dry cough x 1 week. Also body aches. Hx of allergies. Felt feverish but never took temperature. Had been taking ibuprofen as needed.

## 2018-01-08 NOTE — Discharge Instructions (Signed)
I recommend rest and pushing fluids. Take all medication as prescribed.

## 2018-01-08 NOTE — ED Provider Notes (Signed)
[facility]    CSN: [id number] Arrival date & time: 01/08/18  1317     History   Chief Complaint Chief Complaint  Patient presents with  . Cough    HPI Shannon May is a 47 y.o. female. Presents for evaluation of one week of gradually worsening cough, chest tightness, body aches, and fatigue. 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 She reports increase effort with breathing and shortness of breath with coughing. Previously prescribed an albuterol inhaler for history of bronchospasm in the absence of a diagnosis of asthma. She has no history of smoking, although her husband smokes outside of the home. Uncertain of fever. Continues to experience generalized joint and body aches. She has taken ibuprofen with only mid relief. Denies headache, nasal congestion or drainage, nausea/vomiting or diarrhea. Past Medical History:  Diagnosis Date  . Hypertension 11/08/2017  . Sinusitis 11/15/2017    Patient Active Problem List   Diagnosis Date Noted  . Sinusitis 11/15/2017  . Nodule of ear canal, bilateral 11/15/2017  . Hypertension 11/08/2017  . Venous stasis 05/13/2016  . Plantar fasciitis 05/23/2014  . Lumbar radiculitis 05/23/2014  . Verruca pedis 11/30/2012  . Left subacromial bursitis 11/30/2012  . DERMATITIS, 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/06/2010  . HEADACHE 04/06/2010  . HEEL PAIN, RIGHT 03/12/2009  . ALLERGIC RHINITIS 05/09/2008    Past Surgical History:  Procedure Laterality Date  . DILATION AND CURETTAGE OF UTERUS      OB History    Gravida  5   Para  3   Term      Preterm      AB  2   Living  3     SAB  1   TAB  1   Ectopic      Multiple      Live Births               Home Medications    Prior to Admission medications   Medication Sig Start Date End Date Taking? Authorizing Provider  albuterol (PROVENTIL HFA;VENTOLIN HFA) 108 (90 Base) MCG/ACT  inhaler Inhale 2 puffs into the lungs every 4 (four) hours as needed for wheezing or shortness of breath (cough, shortness of breath or wheezing.). 01/08/18   Scot Jun, FNP  amoxicillin-clavulanate (AUGMENTIN) 875-125 MG tablet Take 1 tablet by mouth 2 (two) times daily. 01/08/18   Scot Jun, FNP  clobetasol (TEMOVATE) 0.05 % external solution USE DAILY TO SCALP AS NEEDED 04/05/17   [provider]  Docusate Calcium (STOOL SOFTENER PO) Take by mouth as needed.    [provider]  FIBER PO Take by mouth.    [provider]  fluconazole (DIFLUCAN) 150 MG tablet Take 1 tablet (150 mg total) by mouth once for 1 dose. Repeat if needed 01/08/18 01/08/18  Scot Jun, FNP  fluticasone [facility]) 50 MCG/ACT nasal spray PLACE 2 SPRAYS INTO THE NOSE DAILY 04/24/14   Breeback, Jade L, PA-C  hydrochlorothiazide (MICROZIDE) 12.5 MG capsule Take 1 capsule (12.5 mg total) by mouth daily. 06/30/17   Hali Marry, MD  HYDROcodone-homatropine (HYCODAN) 5-1.5 MG/5ML syrup Take 5 mLs by mouth every 6 (six) hours as needed for cough (for severe cough only). 01/08/18   Scot Jun, FNP  loratadine (CLARITIN) 10 MG tablet Take 10 mg by mouth daily.    [provider]  Multiple Vitamins-Minerals (MULTIVITAMIN WOMEN PO) Take  1 capsule by mouth daily.    [provider]  Probiotic Product (PRO-BIOTIC BLEND PO) Take by mouth daily.    [provider]    Family History Family History  Problem Relation Age of Onset  . Colon cancer Maternal Grandfather   . Melanoma Paternal Grandfather   . Heart attack Maternal Grandmother   . Hypertension Mother   . Hyperlipidemia Father     Social History Social History   Tobacco Use  . Smoking status: Former Smoker    Types: Cigarettes  . Smokeless tobacco: Never Used  Substance Use Topics  . Alcohol use: Yes    Alcohol/week: 1.8 oz    Types: 3 Glasses of wine per week  . Drug use: No      Allergies   Antihistamines, chlorpheniramine-type; Sudafed [pseudoephedrine hcl]; Sulfamethoxazole; Sulfonamide derivatives; and Erythromycin   Review of Systems Review of Systems Pertinent negatives listed on HPI   Physical Exam Triage Vital Signs ED Triage Vitals [01/08/18 1418]  Enc Vitals Group     BP 126/84     Pulse Rate 71     Resp 18     Temp 98.5 F (36.9 C)     Temp Source Oral     SpO2 97 %     Weight 217 lb (98.4 kg)     Height 5\' 7"  (1.702 m)     Head Circumference      Peak Flow      Pain Score 0     Pain Loc      Pain Edu?      Excl. in [place]?    No data found.  Updated Vital Signs BP 126/84 (BP Location: Right Arm)   Pulse 71   Temp 98.5 F (36.9 C) (Oral)   Resp 18   Ht 5\' 7"  (1.702 m)   Wt 217 lb (98.4 kg)   SpO2 97%   BMI 33.99 kg/m   Visual Acuity Right Eye Distance:   Left Eye Distance:   Bilateral Distance:    Right Eye Near:   Left Eye Near:    Bilateral Near:     Physical Exam  Constitutional: She is oriented to person, place, and time. She appears well-developed and well-nourished. She appears ill.  HENT:  Head: Normocephalic and atraumatic.  Right Ear: External ear normal.  Left Ear: External ear normal.  Nose: Nose normal.  Mouth/Throat: Oropharynx is clear and moist.  Eyes: EOM are normal.  Cardiovascular: Normal rate, regular rhythm and normal heart sounds.  Pulmonary/Chest: She has no decreased breath sounds. She has no wheezes. She exhibits no tenderness.  Breath sounds and ability to move air improved after nebulizer treatment administration.  Cough is dry, barky, and hacking  Abdominal: Soft. Bowel sounds are normal.  Musculoskeletal: Normal range of motion.  Lymphadenopathy:    She has no cervical adenopathy.  Neurological: She is alert and oriented to person, place, and time.  Skin: Skin is warm and dry.  Psychiatric: She has a normal mood and affect. Her behavior is normal. Judgment and thought content  normal.   UC Treatments / Results  Labs (all labs ordered are listed, but only abnormal results are displayed) Labs Reviewed - No data to display  EKG None  Radiology Dg Chest 2 View  Result Date: 01/08/2018 CLINICAL DATA:  Pt c/o dry cough and body aches x 1 week. Hx of htn EXAM: CHEST - 2 VIEW COMPARISON:  None. FINDINGS: Normal mediastinum and cardiac silhouette. Normal  pulmonary vasculature. No evidence of effusion, infiltrate, or pneumothorax. No acute bony abnormality. IMPRESSION: No acute cardiopulmonary process. Electronically Signed   By: Suzy Bouchard M.D.   On: 01/08/2018 15:24    Procedures Procedures (including critical care time)  Medications Ordered in UC Medications  albuterol (PROVENTIL) (2.5 MG/3ML) 0.083% nebulizer solution 2.5 mg (2.5 mg Nebulization Given 01/08/18 1521)    Initial Impression / Assessment and Plan / UC Course  I have reviewed the triage vital signs and the nursing notes.  Pertinent labs & imaging results that were available during my care of the patient were reviewed by me and considered in my medical decision making (see chart for details).     Margerie, 47 year old female, presents today for evaluation of a gradually worsening cough, chest tightness, and generalized body aches.  She is ill-appearing and has a persistent cough which is active today throughout visit. Chest-xray obtain which was negative of infiltrates, effusion, or active lung disease. I suspect illness is secondary to acute bronchitis as patient's work of breathing improved significant after one albuterol nebulizer treatment.  Therefore I will start antibiotic therapy with Augmentin x 10 days. Resume albuterol inhaler, 2 puffs every 4 hours as needed for bronchospasm, wheezing or shortness of breath. For cough, will trial Hycodan syrup as needed. Strongly recommended rest, increase fluid intake, and complete all medications as prescribed. See medication orders. Final Clinical  Impressions(s) / UC Diagnoses   Final diagnoses:  Acute bronchitis, unspecified organism     Discharge Instructions     I recommend rest and pushing fluids. Take all medication as prescribed.   ED Prescriptions    Medication Sig Dispense Auth. Provider   albuterol (PROVENTIL HFA;VENTOLIN HFA) 108 (90 Base) MCG/ACT inhaler Inhale 2 puffs into the lungs every 4 (four) hours as needed for wheezing or shortness of breath (cough, shortness of breath or wheezing.). 1 Inhaler Scot Jun, FNP   amoxicillin-clavulanate (AUGMENTIN) 875-125 MG tablet Take 1 tablet by mouth 2 (two) times daily. 20 tablet Scot Jun, FNP   fluconazole (DIFLUCAN) 150 MG tablet Take 1 tablet (150 mg total) by mouth once for 1 dose. Repeat if needed 1 tablet Scot Jun, FNP   HYDROcodone-homatropine (HYCODAN) 5-1.5 MG/5ML syrup Take 5 mLs by mouth every 6 (six) hours as needed for cough (for severe cough only). 180 mL Scot Jun, FNP     Controlled Substance Prescriptions [place] Controlled Substance Registry consulted? Yes, I have consulted the [place] Controlled Substances Registry for this patient, and feel the risk/benefit ratio today is favorable for proceeding with this prescription for a controlled substance.   Scot Jun, FNP 01/09/18 [phone]

## 2018-01-16 ENCOUNTER — Ambulatory Visit: Payer: BC Managed Care – PPO | Admitting: Family Medicine

## 2018-01-16 NOTE — Progress Notes (Deleted)
   Subjective:    Patient ID: Shannon May, female    DOB: 04-Sep-1971, 47 y.o.   MRN: [id number]  HPI    Review of Systems     Objective:   Physical Exam        Assessment & Plan:

## 2018-04-04 ENCOUNTER — Other Ambulatory Visit: Payer: Self-pay | Admitting: Physician Assistant

## 2018-05-11 ENCOUNTER — Ambulatory Visit: Payer: BC Managed Care – PPO | Admitting: Family Medicine

## 2018-05-11 ENCOUNTER — Encounter: Payer: Self-pay | Admitting: Family Medicine

## 2018-05-11 VITALS — BP 132/75 | HR 70 | Ht 67.0 in | Wt 212.0 lb

## 2018-05-11 DIAGNOSIS — Z1231 Encounter for screening mammogram for malignant neoplasm of breast: Secondary | ICD-10-CM | POA: Diagnosis not present

## 2018-05-11 DIAGNOSIS — I10 Essential (primary) hypertension: Secondary | ICD-10-CM

## 2018-05-11 DIAGNOSIS — Z23 Encounter for immunization: Secondary | ICD-10-CM | POA: Diagnosis not present

## 2018-05-11 NOTE — Progress Notes (Signed)
Subjective:    CC: BP  HPI:  Hypertension- Pt denies chest pain, SOB, dizziness, or heart palpitations.  Taking meds as directed w/o problems.  Denies medication side effects.   Has been tracking BP at home and numbers look great. Mostly in the 120/70s.    Past medical history, Surgical history, Family history not pertinant except as noted below, Social history, Allergies, and medications have been entered into the medical record, reviewed, and corrections made.   Review of Systems: No fevers, chills, night sweats, weight loss, chest pain, or shortness of breath.   Objective:    General: Well Developed, well nourished, and in no acute distress.  Neuro: Alert and oriented x3, extra-ocular muscles intact, sensation grossly intact.  HEENT: Normocephalic, atraumatic  Skin: Warm and dry, no rashes. Cardiac: Regular rate and rhythm, no murmurs rubs or gallops, no lower extremity edema.  Respiratory: Clear to auscultation bilaterally. Not using accessory muscles, speaking in full sentences.   Impression and Recommendations:    HTN - Well controlled. Continue current regimen. Follow up in  6 mo. She is still working on her dissertation  Tdap and flu vaccine given today.  She is overdue for her mammogram and will place order and get that scheduled ASAP.

## 2018-05-11 NOTE — Addendum Note (Signed)
Addended by: Teddy Spike on: 05/11/2018 02:52 PM   Modules accepted: Orders

## 2018-05-12 LAB — BASIC METABOLIC PANEL WITH GFR
BUN: 12 mg/dL (ref 7–25)
CO2: 28 mmol/L (ref 20–32)
CREATININE: 0.98 mg/dL (ref 0.50–1.10)
Calcium: 9.5 mg/dL (ref 8.6–10.2)
Chloride: 100 mmol/L (ref 98–110)
GFR, EST AFRICAN AMERICAN: 79 mL/min/{1.73_m2} (ref >=60)
GFR, Est Non African American: 68 mL/min/{1.73_m2} (ref >=60)
GLUCOSE: 93 mg/dL (ref 65–99)
Potassium: 3.4 mmol/L — ABNORMAL LOW (ref 3.5–5.3)
SODIUM: 137 mmol/L (ref 135–146)

## 2018-05-25 ENCOUNTER — Ambulatory Visit (INDEPENDENT_AMBULATORY_CARE_PROVIDER_SITE_OTHER): Payer: BC Managed Care – PPO

## 2018-05-25 DIAGNOSIS — R928 Other abnormal and inconclusive findings on diagnostic imaging of breast: Secondary | ICD-10-CM | POA: Diagnosis not present

## 2018-05-25 DIAGNOSIS — Z1231 Encounter for screening mammogram for malignant neoplasm of breast: Secondary | ICD-10-CM

## 2018-05-26 ENCOUNTER — Other Ambulatory Visit: Payer: Self-pay | Admitting: Family Medicine

## 2018-05-26 DIAGNOSIS — R921 Mammographic calcification found on diagnostic imaging of breast: Secondary | ICD-10-CM

## 2018-05-30 ENCOUNTER — Ambulatory Visit
Admission: RE | Admit: 2018-05-30 | Discharge: 2018-05-30 | Disposition: A | Discharge status: Home / Self Care | Payer: BC Managed Care – PPO | Source: Ambulatory Visit | Attending: Family Medicine | Admitting: Family Medicine

## 2018-05-30 ENCOUNTER — Other Ambulatory Visit: Payer: Self-pay | Admitting: Family Medicine

## 2018-05-30 DIAGNOSIS — R921 Mammographic calcification found on diagnostic imaging of breast: Secondary | ICD-10-CM

## 2018-05-31 ENCOUNTER — Ambulatory Visit
Admission: RE | Admit: 2018-05-31 | Discharge: 2018-05-31 | Disposition: A | Discharge status: Home / Self Care | Payer: BC Managed Care – PPO | Source: Ambulatory Visit | Attending: Family Medicine | Admitting: Family Medicine

## 2018-05-31 DIAGNOSIS — R921 Mammographic calcification found on diagnostic imaging of breast: Secondary | ICD-10-CM

## 2018-06-07 ENCOUNTER — Other Ambulatory Visit: Payer: Self-pay | Admitting: General Surgery

## 2018-06-07 DIAGNOSIS — D0512 Intraductal carcinoma in situ of left breast: Secondary | ICD-10-CM

## 2018-06-08 ENCOUNTER — Encounter: Payer: Self-pay | Admitting: Plastic Surgery

## 2018-06-08 ENCOUNTER — Encounter: Payer: Self-pay | Admitting: Radiation Oncology

## 2018-06-08 ENCOUNTER — Ambulatory Visit: Payer: BC Managed Care – PPO | Admitting: Plastic Surgery

## 2018-06-08 ENCOUNTER — Ambulatory Visit
Admission: RE | Admit: 2018-06-08 | Discharge: 2018-06-08 | Disposition: A | Discharge status: Home / Self Care | Payer: BC Managed Care – PPO | Source: Ambulatory Visit | Attending: General Surgery | Admitting: General Surgery

## 2018-06-08 VITALS — BP 136/80 | HR 74 | Resp 12 | Ht 67.0 in | Wt 210.0 lb

## 2018-06-08 DIAGNOSIS — N62 Hypertrophy of breast: Secondary | ICD-10-CM | POA: Insufficient documentation

## 2018-06-08 DIAGNOSIS — C50412 Malignant neoplasm of upper-outer quadrant of left female breast: Secondary | ICD-10-CM

## 2018-06-08 DIAGNOSIS — Z170 Estrogen receptor positive status [ER+]: Secondary | ICD-10-CM | POA: Diagnosis not present

## 2018-06-08 DIAGNOSIS — D0512 Intraductal carcinoma in situ of left breast: Secondary | ICD-10-CM

## 2018-06-08 HISTORY — DX: Malignant neoplasm of upper-outer quadrant of left female breast: Z17.0

## 2018-06-08 HISTORY — DX: Estrogen receptor positive status (ER+): C50.412

## 2018-06-08 MED ORDER — GADOBENATE DIMEGLUMINE 529 MG/ML IV SOLN
20.0000 mL | Freq: Once | INTRAVENOUS | Status: AC | PRN
  Administered 2018-06-08: 20 mL via INTRAVENOUS

## 2018-06-08 NOTE — Progress Notes (Addendum)
Patient ID: Shannon May, female    DOB: October 01, 1970, 47 y.o.   MRN: [id number]  Chief Complaint  Patient presents with  . Breast Cancer    The patient is a 47 year old white female here with her husband for evaluation for breast reconstruction.  She was diagnosed with ductal carcinoma in situ of the LEFT Breast.  She had her first mammogram in 8 years and has a 4.5 cm area of calcification in the upper outer quadrant of the left breast.  She is planning on getting the MRI today.  The tumor is ER and PR positive.  Her family history is positive for colon cancer in her maternal grandfather.  Pre op bra size is 38 DD.  She would be happy with a C cup but does not want to go smaller.  The sternal notch to NAC on the right is 22 cm and left 22 cm. The NAC is 7 cm on each side and NAC to inframammary fold is 12 - 14 cm.  She is a Art therapist.  Her daughter and son-in-law are applying to adopt a child.   Review of Systems  Constitutional: Negative for activity change and appetite change.  HENT: Negative.   Eyes: Negative.   Respiratory: Negative.   Cardiovascular: Negative.   Gastrointestinal: Negative.   Genitourinary: Negative.   Musculoskeletal: Positive for neck pain.  Skin: Negative.  Negative for color change, pallor and wound.  Hematological: Negative.     Past Medical History:  Diagnosis Date  . Diverticulosis   . Hypertension 11/08/2017  . Malignant neoplasm of upper-outer quadrant of left breast in female, estrogen receptor positive ([facility]) 06/08/2018  . Sinusitis 11/15/2017    Past Surgical History:  Procedure Laterality Date  . BREAST SURGERY    . DILATION AND CURETTAGE OF UTERUS    . LASIK        Current Outpatient Medications:  .  Docusate Calcium (STOOL SOFTENER PO), Take by mouth as needed., Disp: , Rfl:  .  FIBER PO, Take by mouth., Disp: , Rfl:  .  fluticasone (FLONASE) 50 MCG/ACT nasal spray, PLACE 2 SPRAYS INTO THE NOSE DAILY, Disp: 16 g, Rfl: 1 .   hydrochlorothiazide (MICROZIDE) 12.5 MG capsule, TAKE 1 CAPSULE (12.5 MG TOTAL) BY MOUTH DAILY., Disp: 90 capsule, Rfl: 0 .  loratadine (CLARITIN) 10 MG tablet, Take 10 mg by mouth daily., Disp: , Rfl:  .  Multiple Vitamins-Minerals (MULTIVITAMIN WOMEN PO), Take 1 capsule by mouth daily., Disp: , Rfl:  .  Probiotic Product (PRO-BIOTIC BLEND PO), Take by mouth daily., Disp: , Rfl:    Objective:   Vitals:   06/08/18 1000  Pulse: 74  Resp: 12  SpO2: 99%    Physical Exam  Constitutional: She is oriented to person, place, and time. She appears well-developed and well-nourished.  HENT:  Head: Normocephalic and atraumatic.  Eyes: Pupils are equal, round, and reactive to light. EOM are normal.  Cardiovascular: Normal rate and regular rhythm.  Pulmonary/Chest: No respiratory distress.  Abdominal: Soft. She exhibits no distension.  Neurological: She is alert and oriented to person, place, and time.  Skin: Skin is warm. No erythema.  Psychiatric: She has a normal mood and affect. Her behavior is normal. Thought content normal.    Assessment & Plan:  Malignant neoplasm of upper-outer quadrant of left breast in female, estrogen receptor positive ([facility])  Symptomatic mammary hypertrophy  Assessment and Plan:  A long, detailed conversation was had regarding the patient's  options for breast reconstruction. Five main points, which are explained to all breast reconstruction patients, were discussed.  1. Breast reconstruction is an optional process.  2. Breast reconstruction is a multi-stage process which involves multiple surgeries spaced several months apart. The entire process can take over one year.  3. The major goal of breast reconstruction is to have the patient look normal in clothing. When naked, there will always be scars.  4. Asymmetries are often present during the reconstruction process. Several operations may be needed, including surgery to the non-cancerous breast, to achieve  satisfactory results.  5. No matter the reconstructive method, there are ways that the reconstruction can fail and a secondary reconstructive plan would need to be created.   A general discussion regarding all available methods of breast reconstruction were discussed. The types of reconstructions described included.  1. Tissue expander and implant based reconstruction, both single and multi-stage approaches.  2. Autologous only reconstructions, including free abdominal-tissue based reconstructions.  3. Combination procedures, particularly latissismus dorsi flaps combined with either expanders or implants.  For each of the reconstruction methods mentioned above, the risks, benefits, alternatives, scarring, and recovery time were discussed in great detail. Specific risks detailed included bleeding, infection, hematoma, seroma, scarring, pain, wound healing complications, flap loss, fat necrosis, capsular contracture, need for implant removal, donor site complications, bulge, hernia, umbilical necrosis, need for urgent reoperation, and need for dressing changes were discussed.   Assessment  Once all reconstruction options were presented, a focused discussion was had regarding the patient's suitability for each of these procedures.  A total of 50 minutes of face-to-face time was spent in this encounter, of which >50% was spent in counseling. She would like to make her final decision after the results of the MRI are known but at this time is interested in a left breast reconstruction with reduction tissue rearrangement and a right breast reduction for symmetry.  She is aware of the effects to the tissue after radiation.  Heard, DO   Call received and patient will now have a bilateral mastectomy with immediate reconstruction with expander and FlexHD.  Will send the order in now for the expanders.

## 2018-06-08 NOTE — Progress Notes (Signed)
Location of Breast Cancer: Left Breast  Histology per Pathology Report:  05/31/18 Diagnosis Breast, left, needle core biopsy, upper outer quadrant - DUCTAL CARCINOMA IN SITU PARTIALLY INVOLVING AN INTRADUCTAL PAPILLOMA WITH CALCIFICATIONS Receptor Status: ER(100%), PR (100%)  Did patient present with symptoms or was this found on screening mammography?: She had her first mammogram in 8 years and the mass was discovered.   Past/Anticipated interventions by surgeon, if any: Dr. Marlou Starks- She is to have a biopsy on 06/22/18 after new discoveries on MRI  Past/Anticipated interventions by medical oncology, if any:  Dr. Lindi Adie, later today (06/13/18)  Lymphedema issues, if any:  N/A   Pain issues, if any:  She has some soreness to her biopsy site.   SAFETY ISSUES:  Prior radiation? No  Pacemaker/ICD? No  Possible current pregnancy? She is having small periods. She did have a DNC and cervical ablation several years ago. Her husband did have a vasectomy many years ago.   Is the patient on methotrexate? No  Current Complaints / other details:  06/08/18 Dr. Marla Roe 06/08/18 MRI Breast   She is going to have a second opinion at [facility] [place] this weekend.   BP 119/84 (BP Location: Right Arm, Patient Position: Sitting)   Pulse 65   Temp 97.8 F (36.6 C) (Oral)   Resp 20   Ht 5' 7.5" (1.715 m)   Wt 208 lb (94.3 kg)   SpO2 100%   BMI 32.10 kg/m    Wt Readings from Last 3 Encounters:  06/13/18 208 lb (94.3 kg)  06/08/18 210 lb (95.3 kg)  05/11/18 212 lb (96.2 kg)       Shannon May, Shannon Police, RN 06/08/2018,9:59 AM

## 2018-06-12 ENCOUNTER — Telehealth: Payer: Self-pay

## 2018-06-12 ENCOUNTER — Other Ambulatory Visit: Payer: Self-pay | Admitting: General Surgery

## 2018-06-12 DIAGNOSIS — F064 Anxiety disorder due to known physiological condition: Secondary | ICD-10-CM

## 2018-06-12 DIAGNOSIS — R928 Other abnormal and inconclusive findings on diagnostic imaging of breast: Secondary | ICD-10-CM

## 2018-06-12 MED ORDER — ALPRAZOLAM 0.5 MG PO TABS: 0.5000 mg | ORAL_TABLET | Freq: Every day | ORAL | 0 refills | Status: DC | PRN

## 2018-06-12 NOTE — Telephone Encounter (Signed)
Patient advised. Referral ordered.

## 2018-06-12 NOTE — Telephone Encounter (Signed)
Sent for Xanax sent to pharmacy.  Okay to place referral for therapy.

## 2018-06-12 NOTE — Telephone Encounter (Signed)
Shannon May is having a lot of anxiety due to all the breast procedures. She would like a few Xanax to get her through the next couple of procedures. She would also like a referral for therapy.

## 2018-06-13 ENCOUNTER — Ambulatory Visit
Admission: RE | Admit: 2018-06-13 | Discharge: 2018-06-13 | Disposition: A | Discharge status: Home / Self Care | Payer: BC Managed Care – PPO | Source: Ambulatory Visit | Attending: Radiation Oncology | Admitting: Radiation Oncology

## 2018-06-13 ENCOUNTER — Other Ambulatory Visit: Payer: Self-pay

## 2018-06-13 ENCOUNTER — Inpatient Hospital Stay: Payer: BC Managed Care – PPO | Attending: Hematology and Oncology | Admitting: Hematology and Oncology

## 2018-06-13 ENCOUNTER — Encounter: Payer: Self-pay | Admitting: Radiation Oncology

## 2018-06-13 VITALS — BP 121/63 | HR 69 | Temp 98.6°F | Resp 18 | Ht 67.5 in | Wt 209.5 lb

## 2018-06-13 VITALS — BP 119/84 | HR 65 | Temp 97.8°F | Resp 20 | Ht 67.5 in | Wt 208.0 lb

## 2018-06-13 DIAGNOSIS — D0512 Intraductal carcinoma in situ of left breast: Secondary | ICD-10-CM | POA: Insufficient documentation

## 2018-06-13 DIAGNOSIS — C50412 Malignant neoplasm of upper-outer quadrant of left female breast: Secondary | ICD-10-CM

## 2018-06-13 DIAGNOSIS — Z79899 Other long term (current) drug therapy: Secondary | ICD-10-CM | POA: Insufficient documentation

## 2018-06-13 DIAGNOSIS — D0511 Intraductal carcinoma in situ of right breast: Secondary | ICD-10-CM | POA: Diagnosis not present

## 2018-06-13 DIAGNOSIS — Z87891 Personal history of nicotine dependence: Secondary | ICD-10-CM | POA: Insufficient documentation

## 2018-06-13 DIAGNOSIS — Z170 Estrogen receptor positive status [ER+]: Secondary | ICD-10-CM | POA: Insufficient documentation

## 2018-06-13 MED ORDER — TAMOXIFEN CITRATE 20 MG PO TABS: 20.0000 mg | ORAL_TABLET | Freq: Every day | ORAL | 3 refills | Status: DC

## 2018-06-13 NOTE — Progress Notes (Addendum)
Radiation Oncology         [phone] ________________________________  Initial Outpatient Consultation  Name: Shannon May MRN: [id number]  Date: 06/13/2018  DOB: 1971/02/03  GU:[id number], Rene Kocher, MD  Jovita Kussmaul, MD   REFERRING PHYSICIAN: Autumn Messing III, MD  DIAGNOSIS:    ICD-10-CM   1. Malignant neoplasm of upper-outer quadrant of left breast in female, estrogen receptor positive [facility]) C50.412 Ambulatory referral to Social Work   Z17.0   2. 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 female who presented with left breast calcifications on bilateral screening mammogram on the date of 05/25/2018.  She reports she was asymptomatic at that time but states that she has had breast sensitivity over the past year during menstrual cycles.   Diagnostic mammogram of the left breast on 05/30/2018 revealed a 4.5 cm area of loosely grouped pleomorphic calcifications in the UOQ of the left breast.   Biopsy on date of 05/31/2018 showed ductal carcinoma in situ partially involving an intraductal papilloma with calcifications. ER status: positive; PR status: positive; Low Grade.  Bilateral breast MRI on 06/08/2018 showed a segmental non-mass enhancement over the left UOQ, spanning 3.1 x 7.8 x 2.3 cm. There is a separate 2.4 cm focus of linear non-mass enhancement over the left LOQ. There is also a subtle patchy non-mass enhancement over the right UOQ with most suspicious focus measuring 2.1 cm. No axillary adenopathy.  Given these new findings on MRI, the patient will undergo additional bilateral breast biopsies on 06/21/2018. The patient has been referred today for discussion of potential radiation treatment options and will see Dr. Lindi Adie in medical oncology later today. She is  accompanied today by her mother.  On review of systems, the patient reports some soreness to the biopsy site.    PREVIOUS RADIATION THERAPY: No  PAST MEDICAL HISTORY:  has a past medical history of Diverticulosis, Hypertension (11/08/2017), Malignant neoplasm of upper-outer quadrant of left breast in female, estrogen receptor positive ([place]) (06/08/2018), and Sinusitis (11/15/2017).    PAST SURGICAL HISTORY: Past Surgical History:  Procedure Laterality Date  . BREAST SURGERY    . DILATION AND CURETTAGE OF UTERUS    . LASIK      FAMILY HISTORY: family history includes Colon cancer in her maternal grandfather; Heart attack in her maternal grandmother; Hyperlipidemia in her father; Hypertension in her mother; Melanoma in her paternal grandfather; Vaginal cancer in her maternal aunt.  SOCIAL HISTORY:  reports that she quit smoking about 30 years ago. Her smoking use included cigarettes. She has never used smokeless tobacco. She reports that she drinks about 3.0 standard drinks of alcohol per week. She reports that she does not use drugs. She resides in [place]. 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 She is currently working on her doctorate degree in music education at [employer].  ALLERGIES: Antihistamines, chlorpheniramine-type; Sudafed [pseudoephedrine hcl]; Sulfamethoxazole; Sulfonamide derivatives; and Erythromycin  MEDICATIONS:  Current Outpatient Medications  Medication Sig Dispense Refill  . ALPRAZolam (XANAX) 0.5 MG tablet Take 1-2 tablets (0.5-1 mg total) by mouth daily as needed for anxiety. 12 tablet 0  . Docusate Calcium (STOOL SOFTENER PO) Take by mouth 2 (two) times daily.     Marland Kitchen  fluticasone (FLONASE) 50 MCG/ACT nasal spray PLACE 2 SPRAYS INTO THE NOSE DAILY 16 g 1  . hydrochlorothiazide (MICROZIDE) 12.5 MG capsule TAKE 1 CAPSULE (12.5 MG TOTAL) BY MOUTH DAILY. 90 capsule 0  . loratadine (CLARITIN) 10 MG tablet Take 10 mg by mouth  daily.    . Multiple Vitamins-Minerals (MULTIVITAMIN WOMEN PO) Take 1 capsule by mouth daily.    . Probiotic Product (PRO-BIOTIC BLEND PO) Take by mouth daily.     No current facility-administered medications for this encounter.     REVIEW OF SYSTEMS: as above   PHYSICAL EXAM:  height is 5' 7.5" (1.715 m) and weight is 208 lb (94.3 kg). Her oral temperature is 97.8 F (36.6 C). Her blood pressure is 119/84 and her pulse is 65. Her respiration is 20 and oxygen saturation is 100%.   General: Alert and oriented, in no acute distress. HEENT: Head is normocephalic. Extraocular movements are intact. Oropharynx is clear. Neck: Neck is supple, no palpable cervical or supraclavicular lymphadenopathy. Heart: Regular in rate and rhythm with no murmurs, rubs, or gallops. Chest: Clear to auscultation bilaterally, with no rhonchi, wheezes, or rales. Abdomen: Soft, nontender, nondistended, with no rigidity or guarding. Extremities: No cyanosis or edema. Lymphatics: see Neck Exam Skin: No concerning lesions. Musculoskeletal: Symmetric strength and muscle tone throughout. Neurologic: Cranial nerves II through XII are grossly intact. No obvious focalities. Speech is fluent. Coordination is intact. Psychiatric: Judgment and insight are intact. Affect is appropriate. Breasts: She has some tenderness to palpation at the biopsy site of her left breast. No palpable masses appreciated in the breasts or axillae.  ECOG = 0  0 - Asymptomatic (Fully active, able to carry on all predisease activities without restriction)  1 - Symptomatic but completely ambulatory (Restricted in physically strenuous activity but ambulatory and able to carry out work of a light or sedentary nature. For example, light housework, office work)  2 - Symptomatic, <50% in bed during the day (Ambulatory and capable of all self care but unable to carry out any work activities. Up and about more than 50% of waking hours)  3 - Symptomatic,  >50% in bed, but not bedbound (Capable of only limited self-care, confined to bed or chair 50% or more of waking hours)  4 - Bedbound (Completely disabled. Cannot carry on any self-care. Totally confined to bed or chair)  5 - Death   Eustace Pen MM, Creech RH, Tormey DC, et al. [phone]). "Toxicity and response criteria of the [employer]". [place] Oncol. 5 (6): 649-55   LABORATORY DATA:  Lab Results  Component Value Date   WBC 6.7 12/02/2017   HGB 13.2 12/02/2017   HCT 37.3 12/02/2017   MCV 93.7 12/02/2017   PLT 308 12/02/2017   CMP     Component Value Date/Time   NA 137 05/11/2018 1507   K 3.4 (L) 05/11/2018 1507   CL 100 05/11/2018 1507   CO2 28 05/11/2018 1507   GLUCOSE 93 05/11/2018 1507   BUN 12 05/11/2018 1507   CREATININE 0.98 05/11/2018 1507   CALCIUM 9.5 05/11/2018 1507   PROT 6.8 12/02/2017 0949   ALBUMIN 4.4 12/08/2016 0921   AST 16 12/02/2017 0949   ALT 14 12/02/2017 0949   ALKPHOS 59 12/08/2016 0921   BILITOT 0.4 12/02/2017 0949   GFRNONAA 68 05/11/2018 1507   GFRAA 79 05/11/2018 1507         RADIOGRAPHY: Mr Breast Bilateral W Wo Contrast Inc Cad  Result Date:  06/08/2018 CLINICAL DATA:  Recent biopsy-proven low-grade DCIS involving intraductal papilloma upper outer left breast. LABS:  None. EXAM: BILATERAL BREAST MRI WITH AND WITHOUT CONTRAST TECHNIQUE: Multiplanar, multisequence MR images of both breasts were obtained prior to and following the intravenous administration of 20 ml of MultiHance. Three-dimensional MR images were rendered by post-processing the original MR data using the DynaCAD thin client. The 3D MR images are interpreted and the findings are included in the complete MRI report below. COMPARISON:  Previous exam(s). FINDINGS: Breast composition: b. Scattered fibroglandular tissue. Background parenchymal enhancement: Mild. Right breast: Subtle patchy non mass enhancement within the upper-outer right breast most prominent in the  middle third measuring 2.1 cm in length (PACS image number 49 series 6). Left breast: Segmental non mass enhancement within the upper-outer left breast with biopsy clip artifact over the mid to posterior aspect of this non mass enhancement compatible with biopsy-proven DCIS. This spans approximately 3.1 x 7.8 x 2.3 cm in transverse, AP and craniocaudal dimensions. There is focal linear non mass enhancement over the mid to anterior third of the lower outer quadrant measuring 2.4 cm in length (PACS image number 106 series 6). Lymph nodes: No abnormal appearing lymph nodes. Ancillary findings:  None. IMPRESSION: 1. Segmental non mass enhancement over the left upper outer quadrant containing clip artifact known to represent biopsy-proven low-grade DCIS as this spans approximately 3.1 x 7.8 x 2.3 cm in AP, transverse and craniocaudal dimensions. Separate 2.4 cm focus of linear non mass enhancement over the left outer lower quadrant. 2. Subtle patchy non mass enhancement over the right upper outer quadrant with most suspicious focus measuring 2.1 cm. 3.  No axillary adenopathy. RECOMMENDATION: 1. If breast conservation is desired, consider MRI biopsy along the anterior extent of the segmental non mass enhancement in the left upper outer quadrant. Also recommend MRI guided biopsy of the linear focus of non mass enhancement over the left outer lower quadrant in the mid to anterior third. 2. Recommend MRI guided biopsy of the non mass enhancement over the upper-outer right breast targeting the linear focus over the middle third. BI-RADS CATEGORY  4: Suspicious. Electronically Signed   By: Marin Olp M.D.   On: 06/08/2018 16:06   Mm Digital Diagnostic Unilat L  Result Date: 05/30/2018 CLINICAL DATA:  Calcifications in the upper-outer left breast on a recent screening mammogram. EXAM: DIGITAL DIAGNOSTIC LEFT MAMMOGRAM WITH CAD COMPARISON:  Previous exam(s). ACR Breast Density Category c: The breast tissue is  heterogeneously dense, which may obscure small masses. FINDINGS: True lateral and spot magnification views of the left breast were obtained. These demonstrate loosely grouped calcifications in the upper-outer quadrant of the breast, spanning 4.5 x 3.8 x 1.6 cm. These vary in size, shape and density. Mammographic images were processed with CAD. IMPRESSION: 4.5 cm area of loosely grouped pleomorphic calcifications in the upper-outer quadrant of the left breast. These have indeterminate mammographic features with differential considerations including malignancy and fibrocystic changes. RECOMMENDATION: Stereotactic guided core needle biopsy of the indeterminate calcifications in the upper-outer left breast. This has been discussed with patient and scheduled at 11:30 a.m. on 05/31/2018. I have discussed the findings and recommendations with the patient. Results were also provided in writing at the conclusion of the visit. If applicable, a reminder letter will be sent to the patient regarding the next appointment. BI-RADS CATEGORY  4: Suspicious. Electronically Signed   By: Claudie Revering M.D.   On: 05/30/2018 16:37   Mm 3d Screen Breast Bilateral  Result Date: 05/25/2018 CLINICAL DATA:  Screening. EXAM: DIGITAL SCREENING BILATERAL MAMMOGRAM WITH TOMO AND CAD COMPARISON:  Previous exam(s). ACR Breast Density Category c: The breast tissue is heterogeneously dense, which may obscure small masses. FINDINGS: In the left breast, calcifications warrant further evaluation. In the right breast, no findings suspicious for malignancy. Images were processed with CAD. IMPRESSION: Further evaluation is suggested for calcifications in the left breast. RECOMMENDATION: Diagnostic mammogram of the left breast. (Code:[id number]) The patient will be contacted regarding the findings, and additional imaging will be scheduled. BI-RADS CATEGORY  0: Incomplete. Need additional imaging evaluation and/or prior mammograms for comparison.  Electronically Signed   By: Ammie Ferrier M.D.   On: 05/25/2018 16:42   Mm Clip Placement Left  Result Date: 05/31/2018 CLINICAL DATA:  Evaluate biopsy marker EXAM: DIAGNOSTIC LEFT MAMMOGRAM POST STEREOTACTIC BIOPSY COMPARISON:  Previous exam(s). FINDINGS: Mammographic images were obtained following stereotactic guided biopsy of left breast calcifications. The coil shaped clip is 7 mm inferior to the biopsied calcifications. IMPRESSION: Clip placement as above. The coil shaped clip is 7 mm inferior to the biopsied calcifications. Final Assessment: Post Procedure Mammograms for Marker Placement Electronically Signed   By: Dorise Bullion III M.D   On: 05/31/2018 13:00   Mm Lt Breast Bx Johnella Moloney Dev 1st Lesion Image Bx Spec Stereo Guide  Addendum Date: 06/07/2018   ADDENDUM REPORT: 06/02/2018 13:07 ADDENDUM: Pathology revealed LOW GRADE DUCTAL CARCINOMA IN SITU PARTIALLY INVOLVING AN INTRADUCTAL PAPILLOMA WITH CALCIFICATIONS of LEFT breast upper outer quadrant. This was found to be concordant by Dr. Dorise Bullion. Pathology results were discussed with the patient by telephone. The patient reported doing well after the biopsy with tenderness at the site. Post biopsy instructions and care were reviewed and questions were answered. The patient was encouraged to call [facility] for any additional concerns. Surgical consultation has been arranged with Dr. Autumn Messing at [facility] on June 06, 2018. Pathology results reported by Roselind Messier, RN on 06/02/2018. Electronically Signed   By: Dorise Bullion III M.D   On: 06/02/2018 13:07   Result Date: 06/07/2018 CLINICAL DATA:  Biopsy of calcifications in the upper-outer left breast. EXAM: LEFT BREAST STEREOTACTIC CORE NEEDLE BIOPSY COMPARISON:  Previous exams. FINDINGS: The patient and I discussed the procedure of stereotactic-guided biopsy including benefits and alternatives. We discussed the high likelihood of a  successful procedure. We discussed the risks of the procedure including infection, bleeding, tissue injury, clip migration, and inadequate sampling. Informed written consent was given. The usual time out protocol was performed immediately prior to the procedure. Using sterile technique and 1% Lidocaine as local anesthetic, under stereotactic guidance, a 9 gauge vacuum assisted device was used to perform core needle biopsy of calcifications in the upper-outer left breast using a superior approach. Specimen radiograph was performed showing calcifications in several specimens. Specimens with calcifications are identified for pathology. Lesion quadrant: Upper-outer At the conclusion of the procedure, a tissue marker clip was deployed into the biopsy cavity. Follow-up 2-view mammogram was performed and dictated separately. IMPRESSION: Stereotactic-guided biopsy of calcifications in the upper-outer left breast. No apparent complications. Electronically Signed: By: Dorise Bullion III M.D On: 05/31/2018 11:55      IMPRESSION/PLAN: Left Breast DCIS, pending additional bilateral breast biopsies  It was a pleasure meeting the patient today. We discussed that if she proceeds with mastectomy, it is less likely she would need radiation therapy. I would await referral back to me if radiation therapy  is indicated (based on tumor board discussion) , and that radiation would take 6-7 weeks to complete. If she proceeds with lumpectomy, we discussed that radiotherapy would reduce her risk of locoregional recurrence, and that radiation would take approximately 4 weeks to complete. I would give the patient a few weeks to heal following surgery before starting treatment planning. If chemotherapy were to be given (I think unlikely), this would precede radiotherapy. We discussed the risks, benefits, and side effects of radiotherapy. We spoke about acute effects including skin irritation and fatigue as well as  late effects including  reconstruction complications,  internal organ injury or irritation. We spoke about the latest technology that is used to minimize the risk of late effects for patients undergoing radiotherapy to the breast or chest wall. No guarantees of treatment were given. I will await her referral back to me if indicated for postoperative follow-up and eventual CT simulation/treatment planning, if warranted.  I spent 35 minutes face to face with the patient and more than 50% of that time was spent in counseling and/or coordination of care.   __________________________________________   Eppie Gibson, MD  This document serves as a record of services personally performed by Eppie Gibson, MD. It was created on her behalf by Rae Lips, a trained medical scribe. The creation of this record is based on the scribe's personal observations and the provider's statements to them. This document has been checked and approved by the attending provider.

## 2018-06-13 NOTE — Assessment & Plan Note (Signed)
05/31/2018: Screening mammogram detected left breast calcifications measuring 4.5 cm in the UOQ left breast.  Biopsy revealed low-grade DCIS.  That was ER PR 100% positive.  Tis NX stage 0    Breast MRI: 06/08/2018: Segmental non-mass enhancement over the left UOQ, spanning 3.1 x 7.8 x 2.3 cm. There is a separate 2.4 cm focus of linear non-mass enhancement over the left LOQ. There is also a subtle patchy non-mass enhancement over the right UOQ with most suspicious focus measuring 2.1 cm. No axillary adenopathy.  Pathology review: I discussed with the patient the difference between DCIS and invasive breast cancer. It is considered a precancerous lesion. DCIS is classified as a 0. It is generally detected through mammograms as calcifications. We discussed the significance of grades and its impact on prognosis. We also discussed the importance of ER and PR receptors and their implications to adjuvant treatment options. Prognosis of DCIS dependence on grade, comedo necrosis. It is anticipated that if not treated, 20-30% of DCIS can develop into invasive breast cancer.  Recommendation: 1.  Probable left mastectomy.  Right breast surgery depending on the biopsy of the right breast. 2. Followed by adjuvant radiation therapy if she undergoes breast conserving surgery 3. Followed by antiestrogen therapy with tamoxifen 5 years  Tamoxifen counseling: We discussed the risks and benefits of tamoxifen. These include but not limited to insomnia, hot flashes, mood changes, vaginal dryness, and weight gain. Although rare, serious side effects including endometrial cancer, risk of blood clots were also discussed. We strongly believe that the benefits far outweigh the risks. Patient understands these risks and consented to starting treatment. Planned treatment duration is 5 years. Because it is likely to take several weeks until she gets surgery, I recommended starting her on tamoxifen neoadjuvantly.   Patient is planning  on going to [facility] [place] for second opinion.  Return to clinic after surgery to discuss pathology report.

## 2018-06-13 NOTE — Progress Notes (Signed)
[facility] CONSULT NOTE  Patient Care Team: Hali Marry, MD as PCP - General  CHIEF COMPLAINTS/PURPOSE OF CONSULTATION:  Newly diagnosed left breast DCIS  HISTORY OF PRESENTING ILLNESS:  Shannon May 47 y.o. female is here because of recent diagnosis of left breast DCIS.  Patient had a screening mammogram which revealed calcifications in the left breast measuring 4.5 cm.  She underwent a biopsy which revealed low-grade DCIS.  This was ER PR positive.  She underwent a breast MRI that revealed 7.8 cm segment of DCIS in addition there was a 2.4 cm focus of non-mass enhancement.  In addition to this on the right breast XR 2.1 cm non-mass enhancement.  She is being evaluated to undergo additional biopsies to know the extent of the DCIS as well as biopsy of the second lesion and the right breast lesion.  She is here today accompanied by her mother.  She appears to be extremely stressed out from the protracted diagnostic process.  She has met with the surgery Dr. Marlou Starks as well as plastic surgery Dr. Marla Roe.  I reviewed her records extensively and collaborated the history with the patient.  SUMMARY OF ONCOLOGIC HISTORY:   Malignant neoplasm of upper-outer quadrant of left breast in female, estrogen receptor positive ([place])   05/31/2018 Initial Diagnosis    Screening mammogram detected left breast calcifications measuring 4.5 cm in the UOQ left breast.  Biopsy revealed low-grade DCIS.  That was ER PR 100% positive.  Tis NX stage 0      06/08/2018 Breast MRI    Segmental non-mass enhancement over the left UOQ, spanning 3.1 x 7.8 x 2.3 cm. There is a separate 2.4 cm focus of linear non-mass enhancement over the left LOQ. There is also a subtle patchy non-mass enhancement over the right UOQ with most suspicious focus measuring 2.1 cm. No axillary adenopathy.    MEDICAL HISTORY:  Past Medical History:  Diagnosis Date  . Diverticulosis   . Hypertension 11/08/2017  .  Malignant neoplasm of upper-outer quadrant of left breast in female, estrogen receptor positive ([place]) 06/08/2018  . Sinusitis 11/15/2017    SURGICAL HISTORY: Past Surgical History:  Procedure Laterality Date  . BREAST SURGERY    . DILATION AND CURETTAGE OF UTERUS    . LASIK      SOCIAL HISTORY: Social History   Socioeconomic History  . Marital status: Married    Spouse name: Ronalee Belts  . Number of children: 3  . Years of education: Masters  . Highest education level: Not on file  Occupational History  . Occupation: Education officer, museum    Comment: [employer]    Employer: [employer]  . Financial resource strain: Not on file  . Food insecurity:    Worry: Not on file    Inability: Not on file  . Transportation needs:    Medical: No    Non-medical: No  Tobacco Use  . Smoking status: Former Smoker    Types: Cigarettes    Last attempt to quit: 06/13/1988    Years since quitting: 30.0  . Smokeless tobacco: Never Used  . Tobacco comment: She smoked a couple of months in her teens.   Substance and Sexual Activity  . Alcohol use: Yes    Alcohol/week: 3.0 standard drinks    Types: 3 Glasses of wine per week  . Drug use: No  . Sexual activity: Yes    Partners: Male  Lifestyle  . Physical activity:  Days per week: Not on file    Minutes per session: Not on file  . Stress: Not on file  Relationships  . Social connections:    Talks on phone: Not on file    Gets together: Not on file    Attends religious service: Not on file    Active member of club or organization: Not on file    Attends meetings of clubs or organizations: Not on file    Relationship status: Not on file  . Intimate partner violence:    Fear of current or ex partner: No    Emotionally abused: No    Physically abused: No    Forced sexual activity: No  Other Topics Concern  . Not on file  Social History Narrative   Treadmill 5 days per week 2 caffeine drinks per day.     FAMILY  HISTORY: Family History  Problem Relation Age of Onset  . Colon cancer Maternal Grandfather   . Melanoma Paternal Grandfather   . Heart attack Maternal Grandmother   . Hypertension Mother   . Hyperlipidemia Father   . Vaginal cancer Maternal Aunt     ALLERGIES:  is allergic to antihistamines, chlorpheniramine-type; sudafed [pseudoephedrine hcl]; sulfamethoxazole; sulfonamide derivatives; and erythromycin.  MEDICATIONS:  Current Outpatient Medications  Medication Sig Dispense Refill  . ALPRAZolam (XANAX) 0.5 MG tablet Take 1-2 tablets (0.5-1 mg total) by mouth daily as needed for anxiety. 12 tablet 0  . Docusate Calcium (STOOL SOFTENER PO) Take by mouth 2 (two) times daily.     . fluticasone (FLONASE) 50 MCG/ACT nasal spray PLACE 2 SPRAYS INTO THE NOSE DAILY 16 g 1  . hydrochlorothiazide (MICROZIDE) 12.5 MG capsule TAKE 1 CAPSULE (12.5 MG TOTAL) BY MOUTH DAILY. 90 capsule 0  . loratadine (CLARITIN) 10 MG tablet Take 10 mg by mouth daily.    . Multiple Vitamins-Minerals (MULTIVITAMIN WOMEN PO) Take 1 capsule by mouth daily.    . Probiotic Product (PRO-BIOTIC BLEND PO) Take by mouth daily.    . tamoxifen (NOLVADEX) 20 MG tablet Take 1 tablet (20 mg total) by mouth daily. 90 tablet 3   No current facility-administered medications for this visit.     REVIEW OF SYSTEMS:   Constitutional: Denies fevers, chills or abnormal night sweats Eyes: Denies blurriness of vision, double vision or watery eyes Ears, nose, mouth, throat, and face: Denies mucositis or sore throat Respiratory: Denies cough, dyspnea or wheezes Cardiovascular: Denies palpitation, chest discomfort or lower extremity swelling Gastrointestinal:  Denies nausea, heartburn or change in bowel habits Skin: Denies abnormal skin rashes Lymphatics: Denies new lymphadenopathy or easy bruising Neurological:Denies numbness, tingling or new weaknesses Behavioral/Psych: Mood is stable, no new changes    All other systems were  reviewed with the patient and are negative.  PHYSICAL EXAMINATION: ECOG PERFORMANCE STATUS: 1 - Symptomatic but completely ambulatory  Vitals:   06/13/18 1429  BP: 121/63  Pulse: 69  Resp: 18  Temp: 98.6 F (37 C)  SpO2: 99%   Filed Weights   06/13/18 1429  Weight: 209 lb 8 oz (95 kg)    GENERAL:alert, no distress and comfortable SKIN: skin color, texture, turgor are normal, no rashes or significant lesions EYES: normal, conjunctiva are pink and non-injected, sclera clear OROPHARYNX:no exudate, no erythema and lips, buccal mucosa, and tongue normal  NECK: supple, thyroid normal size, non-tender, without nodularity LYMPH:  no palpable lymphadenopathy in the cervical, axillary or inguinal LUNGS: clear to auscultation and percussion with normal breathing effort HEART: regular  rate & rhythm and no murmurs and no lower extremity edema ABDOMEN:abdomen soft, non-tender and normal bowel sounds Musculoskeletal:no cyanosis of digits and no clubbing  PSYCH: alert & oriented x 3 with fluent speech NEURO: no focal motor/sensory deficits    LABORATORY DATA:  I have reviewed the data as listed Lab Results  Component Value Date   WBC 6.7 12/02/2017   HGB 13.2 12/02/2017   HCT 37.3 12/02/2017   MCV 93.7 12/02/2017   PLT 308 12/02/2017   Lab Results  Component Value Date   NA 137 05/11/2018   K 3.4 (L) 05/11/2018   CL 100 05/11/2018   CO2 28 05/11/2018    RADIOGRAPHIC STUDIES: I have personally reviewed the radiological reports and agreed with the findings in the report. Patient is a celebrated Hydrologist and a Aeronautical engineer  ASSESSMENT AND PLAN:  Malignant neoplasm of upper-outer quadrant of left breast in female, estrogen receptor positive ([place]) 05/31/2018: Screening mammogram detected left breast calcifications measuring 4.5 cm in the UOQ left breast.  Biopsy revealed low-grade DCIS.  That was ER PR 100% positive.  Tis NX stage 0    Breast MRI:  06/08/2018: Segmental non-mass enhancement over the left UOQ, spanning 3.1 x 7.8 x 2.3 cm. There is a separate 2.4 cm focus of linear non-mass enhancement over the left LOQ. There is also a subtle patchy non-mass enhancement over the right UOQ with most suspicious focus measuring 2.1 cm. No axillary adenopathy.  Pathology review: I discussed with the patient the difference between DCIS and invasive breast cancer. It is considered a precancerous lesion. DCIS is classified as a 0. It is generally detected through mammograms as calcifications. We discussed the significance of grades and its impact on prognosis. We also discussed the importance of ER and PR receptors and their implications to adjuvant treatment options. Prognosis of DCIS dependence on grade, comedo necrosis. It is anticipated that if not treated, 20-30% of DCIS can develop into invasive breast cancer.  Recommendation: 1.  Probable left mastectomy.  Right breast surgery depending on the biopsy of the right breast. 2. Followed by adjuvant radiation therapy if she undergoes breast conserving surgery 3. Followed by antiestrogen therapy with tamoxifen 5 years  Tamoxifen counseling: We discussed the risks and benefits of tamoxifen. These include but not limited to insomnia, hot flashes, mood changes, vaginal dryness, and weight gain. Although rare, serious side effects including endometrial cancer, risk of blood clots were also discussed. We strongly believe that the benefits far outweigh the risks. Patient understands these risks and consented to starting treatment. Planned treatment duration is 5 years. Because it is likely to take several weeks until she gets surgery, I recommended starting her on tamoxifen neoadjuvantly.   We will consult genetics Patient is planning on going to [facility] [place] for second opinion.  Return to clinic after surgery to discuss pathology report.      All questions were answered. The  patient knows to call the clinic with any problems, questions or concerns.    Harriette Ohara, MD 06/13/18

## 2018-06-14 ENCOUNTER — Telehealth: Payer: Self-pay

## 2018-06-14 ENCOUNTER — Encounter: Payer: Self-pay | Admitting: *Deleted

## 2018-06-14 ENCOUNTER — Telehealth: Payer: Self-pay | Admitting: Hematology and Oncology

## 2018-06-14 NOTE — Telephone Encounter (Signed)
Spoke to pt regarding upcoming appts   °

## 2018-06-14 NOTE — Telephone Encounter (Signed)
Pt called to make sure provider knew that she has started tamoxifen. I advised pt that it is in chart and she is aware.   Pt has taken one dose and feels that the medication makes her feel "warm inside" and also a little "queezy". She has verified with pharmacist that there are no interactions. I advised pt to call oncologist and let them know of side effects and let them decide correct plan for her. Pt agreeable and states she will call oncologist.  Juluis Rainier to PCP

## 2018-06-15 ENCOUNTER — Telehealth: Payer: Self-pay | Admitting: Hematology and Oncology

## 2018-06-15 ENCOUNTER — Encounter: Payer: Self-pay | Admitting: General Practice

## 2018-06-15 NOTE — Telephone Encounter (Signed)
Called and pt reports she feel "so much better!" I advised pt if any SX return to call office.

## 2018-06-15 NOTE — Telephone Encounter (Signed)
Faxed medical records to [facility], Release ID: [id number]

## 2018-06-15 NOTE — Progress Notes (Signed)
[facility] Psychosocial Distress Screening Clinical Social Work  Clinical Social Work was referred by distress screening protocol.  The patient scored a 8 on the Psychosocial Distress Thermometer which indicates moderate distress. Clinical Social Worker contacted patient by phone to assess for distress and other psychosocial needs. Discussed normal anxiety inherent in process of coping w cancer diagnosis and treatment planning stage.  Patient is seeking second opinion, gathering more information.  Significant anxiety and distress in self and family.  CSW will mail information packet of resources which can be accessed as needed.  Patient encouraged to connect w CSW.     [facility] DISTRESS SCREENING 06/13/2018  Screening Type Initial Screening  Distress experienced in past week (1-10) 8  Practical problem type Insurance;Work/school  Family Problem type Partner  Emotional problem type Depression;Nervousness/Anxiety;Adjusting to illness;Feeling hopeless;Adjusting to appearance changes  Spiritual/Religous concerns type Loss of sense of purpose  Information Concerns Type Lack of info about diagnosis;Lack of info about treatment  Physical Problem type Nausea/vomiting;Sleep/insomnia;Loss of appetitie;Tingling hands/feet;Sexual problems    Clinical Social Worker follow up needed: No.  If yes, follow up plan:  Beverely Pace, Bricelyn, LCSW Clinical Social Worker Phone:  [phone]

## 2018-06-15 NOTE — Telephone Encounter (Signed)
It can cause hotflashes and flushing, it is actually fairly common with the medication. It cab cause "upset stomach" in a small percentage of people, but it usually gets bettr.  If the flushing or hotflashes get more intense then we may be able to treat that.

## 2018-06-21 ENCOUNTER — Other Ambulatory Visit: Payer: BC Managed Care – PPO

## 2018-06-21 ENCOUNTER — Ambulatory Visit
Admission: RE | Admit: 2018-06-21 | Discharge: 2018-06-21 | Disposition: A | Discharge status: Home / Self Care | Payer: BC Managed Care – PPO | Source: Ambulatory Visit | Attending: General Surgery | Admitting: General Surgery

## 2018-06-21 DIAGNOSIS — R928 Other abnormal and inconclusive findings on diagnostic imaging of breast: Secondary | ICD-10-CM

## 2018-06-21 MED ORDER — GADOBENATE DIMEGLUMINE 529 MG/ML IV SOLN
20.0000 mL | Freq: Once | INTRAVENOUS | Status: AC | PRN
  Administered 2018-06-21: 20 mL via INTRAVENOUS

## 2018-06-27 ENCOUNTER — Ambulatory Visit: Payer: BC Managed Care – PPO | Admitting: Physician Assistant

## 2018-06-27 ENCOUNTER — Encounter: Payer: Self-pay | Admitting: Physician Assistant

## 2018-06-27 VITALS — BP 129/86 | HR 85 | Temp 98.5°F | Ht 67.5 in | Wt 209.0 lb

## 2018-06-27 DIAGNOSIS — R8290 Unspecified abnormal findings in urine: Secondary | ICD-10-CM | POA: Diagnosis not present

## 2018-06-27 DIAGNOSIS — N3001 Acute cystitis with hematuria: Secondary | ICD-10-CM

## 2018-06-27 LAB — POCT URINALYSIS DIPSTICK
Bilirubin, UA: NEGATIVE
Glucose, UA: NEGATIVE
Ketones, UA: NEGATIVE
NITRITE UA: NEGATIVE
PH UA: 5 (ref 5.0–8.0)
PROTEIN UA: NEGATIVE
Spec Grav, UA: <=1.005 — AB (ref 1.010–1.025)
UROBILINOGEN UA: 0.2 U/dL

## 2018-06-27 MED ORDER — NITROFURANTOIN MONOHYD MACRO 100 MG PO CAPS: 100.0000 mg | ORAL_CAPSULE | Freq: Two times a day (BID) | ORAL | 0 refills | Status: DC

## 2018-06-27 NOTE — Progress Notes (Signed)
poct

## 2018-06-27 NOTE — Patient Instructions (Signed)

## 2018-06-27 NOTE — Progress Notes (Signed)
   Subjective:    Patient ID: Shannon May, female    DOB: 1971/04/29, 47 y.o.   MRN: [id number]  HPI  Pt is a 47 yo female with recent hx of DCIS left breast cancer who presents to the clinic with urinary pressure and abnormal color to urine. She has had symptoms for the last 2-3 days. No fever, chills, nausea, body aches. She has had some intermittent right mid back pain but doesn't feel like it is related. Not tried anything to make it better.   She has appt with genetic counselor tomorrow and will start to decide on treatment plan.   .. Active Ambulatory Problems    Diagnosis Date Noted  . ALLERGIC RHINITIS 05/09/2008  . DERMATITIS, 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/06/2010  . HEEL PAIN, RIGHT 03/12/2009  . HEADACHE 04/06/2010  . Verruca pedis 11/30/2012  . Left subacromial bursitis 11/30/2012  . Plantar fasciitis 05/23/2014  . Lumbar radiculitis 05/23/2014  . Venous stasis 05/13/2016  . Hypertension 11/08/2017  . Sinusitis 11/15/2017  . Nodule of ear canal, bilateral 11/15/2017  . Symptomatic mammary hypertrophy 06/08/2018  . Malignant neoplasm of upper-outer quadrant of left breast in female, estrogen receptor positive ([place]) 06/08/2018  . 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   Resolved Ambulatory Problems    Diagnosis Date Noted  . Carbuncle and furuncle of trunk 03/19/2010  . Abdominal pain, acute, epigastric 01/26/2016   Past Medical History:  Diagnosis Date  . Diverticulosis       Review of Systems    see HPI>  Objective:   Physical Exam  Constitutional: She is oriented to person, place, and time. She appears well-developed and well-nourished.  HENT:  Head: Normocephalic and atraumatic.  Cardiovascular: Normal rate and regular rhythm.  Pulmonary/Chest: Effort normal and breath sounds normal.  No CVA tenderness.   Abdominal: Soft. Bowel sounds are normal. She exhibits no mass. There is tenderness. There is no rebound and no guarding.  Suprapubic  pressure.   Neurological: She is alert and oriented to person, place, and time.  Psychiatric: She has a normal mood and affect. Her behavior is normal.          Assessment & Plan:  Marland KitchenMarland KitchenDiagnoses and all orders for this visit:  Acute cystitis with hematuria -     POCT Urinalysis Dipstick -     Urine Culture -     nitrofurantoin, macrocrystal-monohydrate, (MACROBID) 100 MG capsule; Take 1 capsule (100 mg total) by mouth 2 (two) times daily. For 7 days.  Bad odor of urine -     POCT Urinalysis Dipstick -     Urine Culture     .Marland Kitchen Results for orders placed or performed in visit on 06/27/18  POCT Urinalysis Dipstick  Result Value Ref Range   Color, UA light yellow    Clarity, UA cloudy    Glucose, UA Negative Negative   Bilirubin, UA negative    Ketones, UA negative    Spec Grav, UA <=1.005 (A) 1.010 - 1.025   Blood, UA trace-lysed    pH, UA 5.0 5.0 - 8.0   Protein, UA Negative Negative   Urobilinogen, UA 0.2 0.2 or 1.0 E.U./dL   Nitrite, UA negative    Leukocytes, UA Moderate (2+) (A) Negative   Appearance     Odor     Will culture to confirm infection. Due to symptoms and UA positive for leuks and blood will treat with macrobid. HO given on symptomatic care. Stay hydrated.

## 2018-06-28 ENCOUNTER — Telehealth: Payer: Self-pay | Admitting: Licensed Clinical Social Worker

## 2018-06-28 ENCOUNTER — Telehealth: Payer: Self-pay

## 2018-06-28 NOTE — Telephone Encounter (Signed)
Pt called requesting information on how to submit for FMLA. Gave pt information and told her that it will take 7-10 business days to complete. Provided pt with fax number to send over her paperwork.   Pt had a 2nd opinion appt done at [facility] in [place]. She was told by one of the oncologist that she should not be on tamoxifen prior to surgery, as it may cause a false positive/negative on her biopsy results after surgery. Pt has decided to have her surgery done with our recommended surgeons. She would like to see if she needs to be back on tamoxifen or just wait, until after surgery is done.  Told pt that she is able to get back on tamoxifen, as recommended by Dr.Gudena. She may stop taking tamoxifen 1 week prior to surgery date. Pt verbalized understanding and is thankful for the call. No further needs at this time.

## 2018-06-28 NOTE — Telephone Encounter (Signed)
Ms. Diniz called and let us know that she had already had genetic counseling and testing at [facility]. Her testing was through [facility] (she says the 35 gene panel) and results are not available yet, but once they are her doctor will forward them along to [place]. She said she would like to cancel her appointment with [place] for now and I offered to see her once results are back if she has questions.

## 2018-06-29 ENCOUNTER — Inpatient Hospital Stay: Payer: BC Managed Care – PPO | Admitting: Licensed Clinical Social Worker

## 2018-06-29 ENCOUNTER — Inpatient Hospital Stay: Payer: BC Managed Care – PPO

## 2018-06-29 LAB — URINE CULTURE
MICRO NUMBER: 91269073
SPECIMEN QUALITY: ADEQUATE

## 2018-06-29 NOTE — Progress Notes (Signed)
Call pt: E.coli detected. macrobid should treat and should be feeling better.

## 2018-07-02 ENCOUNTER — Other Ambulatory Visit: Payer: Self-pay | Admitting: Family Medicine

## 2018-07-05 ENCOUNTER — Telehealth: Payer: Self-pay | Admitting: *Deleted

## 2018-07-05 NOTE — Telephone Encounter (Signed)
S-poke to pt regarding surgical plan. Pt wishes to- have bilateral mastectomy with reconstruction. Discussed steps after surgery will be f/u with Dr. Lindi Adie after sx to discuss final pathology and since she is having mastectomy for DCIS she does not need xrt unless final pathology shows different dx from core bx. Received verbal understanding. Gave contact information for further needs or questions.  -Pt gave number to [facility] [phone]. Pt wishes number to be shared with Dr. Eusebio Friendly office.

## 2018-07-13 ENCOUNTER — Telehealth: Payer: Self-pay | Admitting: Hematology and Oncology

## 2018-07-13 NOTE — Telephone Encounter (Signed)
Scheduled appt per 11/6 sch message - left message for patient with appt date and time and sent reminder letter in the mail .

## 2018-07-18 ENCOUNTER — Ambulatory Visit: Payer: Self-pay | Admitting: General Surgery

## 2018-07-18 DIAGNOSIS — D0512 Intraductal carcinoma in situ of left breast: Secondary | ICD-10-CM

## 2018-07-22 ENCOUNTER — Encounter: Payer: Self-pay | Admitting: Hematology and Oncology

## 2018-07-25 ENCOUNTER — Ambulatory Visit: Payer: BC Managed Care – PPO | Admitting: Sports Medicine

## 2018-07-25 ENCOUNTER — Encounter: Payer: Self-pay | Admitting: Sports Medicine

## 2018-07-25 VITALS — BP 132/87 | HR 68

## 2018-07-25 DIAGNOSIS — L601 Onycholysis: Secondary | ICD-10-CM

## 2018-07-25 DIAGNOSIS — L84 Corns and callosities: Secondary | ICD-10-CM

## 2018-07-25 DIAGNOSIS — L600 Ingrowing nail: Secondary | ICD-10-CM

## 2018-07-25 DIAGNOSIS — L603 Nail dystrophy: Secondary | ICD-10-CM | POA: Diagnosis not present

## 2018-07-25 DIAGNOSIS — M79674 Pain in right toe(s): Secondary | ICD-10-CM | POA: Diagnosis not present

## 2018-07-25 DIAGNOSIS — M2040 Other hammer toe(s) (acquired), unspecified foot: Secondary | ICD-10-CM

## 2018-07-25 MED ORDER — NEOMYCIN-POLYMYXIN-HC 3.5-10000-1 OT SOLN: OTIC | 0 refills | Status: DC

## 2018-07-25 NOTE — Progress Notes (Signed)
Subjective: Shannon May is a 47 y.o. female patient who presents to office for evaluation of Left foot pain secondary to callus skin at 4th toe. Reports in the past was treated for wart but it came back. Patient also complains of pain at the 1st toe on right as well reports that she had ingrowing but got pedicures and they trimmed them out, reports pain to the nail with difficulty with fitting shoes and states that pain in toe started after running 10 years ago. Patient has tried pedicures, topical, and padding with no relief in symptoms. Patient denies any other pedal complaints.    Patient will be undergoing double mastectomy on 08/14/2018  Patient Active Problem List   Diagnosis Date Noted  . 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 Symptomatic mammary hypertrophy 06/08/2018  . Malignant neoplasm of upper-outer quadrant of left breast in female, estrogen receptor positive ([place]) 06/08/2018  . Sinusitis 11/15/2017  . Nodule of ear canal, bilateral 11/15/2017  . Hypertension 11/08/2017  . Venous stasis 05/13/2016  . Plantar fasciitis 05/23/2014  . Lumbar radiculitis 05/23/2014  . Verruca pedis 11/30/2012  . Left subacromial bursitis 11/30/2012  . DERMATITIS, 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/06/2010  . HEADACHE 04/06/2010  . HEEL PAIN, RIGHT 03/12/2009  . ALLERGIC RHINITIS 05/09/2008    Current Outpatient Medications on File Prior to Visit  Medication Sig Dispense Refill  . ALPRAZolam (XANAX) 0.5 MG tablet Take 1-2 tablets (0.5-1 mg total) by mouth daily as needed for anxiety. 12 tablet 0  . azithromycin (ZITHROMAX) 250 MG tablet     . Docusate Calcium (STOOL SOFTENER PO) Take by mouth 2 (two) times daily.     . fluticasone (FLONASE) 50 MCG/ACT nasal spray PLACE 2 SPRAYS INTO THE NOSE DAILY 16 g 1  . hydrochlorothiazide (MICROZIDE) 12.5 MG capsule TAKE 1 CAPSULE BY MOUTH EVERY DAY 90 capsule 0  . loratadine (CLARITIN) 10 MG tablet Take 10 mg by mouth daily.    . Multiple  Vitamins-Minerals (MULTIVITAMIN WOMEN PO) Take 1 capsule by mouth daily.    . nitrofurantoin, macrocrystal-monohydrate, (MACROBID) 100 MG capsule Take 1 capsule (100 mg total) by mouth 2 (two) times daily. For 7 days. 14 capsule 0  . Probiotic Product (PRO-BIOTIC BLEND PO) Take by mouth daily.    . tamoxifen (NOLVADEX) 20 MG tablet Take 1 tablet (20 mg total) by mouth daily. (Patient not taking: Reported on 06/27/2018) 90 tablet 3   No current facility-administered medications on file prior to visit.     Allergies  Allergen Reactions  . Antihistamines, Chlorpheniramine-Type Shortness Of Breath    Makes heart race  . Sudafed [Pseudoephedrine Hcl]     shaking  . Sulfamethoxazole Nausea And Vomiting  . Sulfonamide Derivatives   . Erythromycin Nausea Only    Objective:  General: Alert and oriented x3 in no acute distress  Dermatology: Keratotic lesion present lateral 4th toe at webspace with skin lines transversing the lesion, pain is present with direct pressure to the lesion with a central nucleated core noted, no webspace macerations, no ecchymosis bilateral, all nails x 10 are well manicured but Right 1st  Toenail is thick and incurvated with no signs of infection but unhealthy nail with trauma lines present.  Vascular: Dorsalis Pedis and Posterior Tibial pedal pulses 2/4, Capillary Fill Time 3 seconds, + pedal hair growth bilateral, no edema bilateral lower extremities, Temperature gradient within normal limits.  Neurology: Gross sensation intact via light touch bilateral.  Musculoskeletal: Mild tenderness with palpation  at the keratotic lesion site on Left and right 1st toenail , Muscular strength 5/5 in all groups without pain or limitation on range of motion. + Hammertoe.  Assessment and Plan: Problem List Items Addressed This Visit    None    Visit Diagnoses    Nail dystrophy    -  Primary   Onycholysis       Ingrown nail       Toe pain, right       Corn of toe        Hammer toe, unspecified laterality         -Complete examination performed -Discussed treatment options -Parred keratoic lesion using a chisel blade at left 4th toe without incident and dispensed spacer -Advised good supportive shoes and inserts -At no charge trimmed right 1st toenail to patient's comfort and rx corticosporin and epsom soaks PRN. Advised patient to wait until after masetctomy procedure to have right 1st toenail removed  -Patient to return to office as needed or sooner if condition worsens.  Landis Martins, DPM

## 2018-08-07 ENCOUNTER — Encounter (HOSPITAL_COMMUNITY): Payer: Self-pay

## 2018-08-07 NOTE — Pre-Procedure Instructions (Signed)
Shannon May  08/07/2018      [id number] - [place], [place] - [address] [place] [address] Phone: [phone] Fax: [phone]    Your procedure is scheduled on 08-14-2018  Monday .  Report to [facility] Admitting at 6:00 A.M.   Call this number if you have problems the morning of surgery:  [phone]   Remember:  Do not eat  Food or drink  Liquids after midnight.        Take these medicines the morning of surgery with A SIP OF WATER   Alprazolam(Xanax) if needed flonase nasal spray  STOP TAKING ANY ASPIRIN (UNLESS OTHERWISE INSTRUCTED BY YOUR SURGEON),ANTIINFLAMATORIES (IBUPROFEN,ALEVE,MOTRIN,ADVIL,GOODY'S POWDERS),HERBAL SUPPLEMENTS,FISH OIL,AND VITAMINS 5-7 DAYS PRIOR TO SURGERY     Do not wear jewelry, make-up or nail polish.  Do not wear lotions, powders, or perfumes, or deodorant.  Do not shave 48 hours prior to surgery.  Men may shave face and neck.  Do not bring valuables to the hospital.  [facility] is not responsible for any belongings or valuables.  Contacts, dentures or bridgework may not be worn into surgery.  Leave your suitcase in the car.  After surgery it may be brought to your room.  For patients admitted to the hospital, discharge time will be determined by your treatment team.  Patients discharged the day of surgery will not be allowed to drive home.     [place] - Preparing for Surgery  Before surgery, you can play an important role.  Because skin is not sterile, your skin needs to be as free of germs as possible.  You can reduce the number of germs on you skin by washing with CHG (chlorahexidine gluconate) soap before surgery.  CHG is an antiseptic cleaner which kills germs and bonds with the skin to continue killing germs even after washing.  Oral Hygiene is also important in reducing the risk of infection.  Remember to brush your teeth with your regular toothpaste the morning of  surgery.  Please DO NOT use if you have an allergy to CHG or antibacterial soaps.  If your skin becomes reddened/irritated stop using the CHG and inform your nurse when you arrive at Short Stay.  Do not shave (including legs and underarms) for at least 48 hours prior to the first CHG shower.  You may shave your face.  Please follow these instructions carefully:   1.  Shower with CHG Soap the night before surgery and the morning of Surgery.  2.  If you choose to wash your hair, wash your hair first as usual with your normal shampoo.  3.  After you shampoo, rinse your hair and body thoroughly to remove the shampoo. 4.  Use CHG as you would any other liquid soap.  You can apply chg directly to the skin and wash gently with a      scrungie or washcloth.           5.  Apply the CHG Soap to your body ONLY FROM THE NECK DOWN.   Do not use on open wounds or open sores. Avoid contact with your eyes, ears, mouth and genitals (private parts).  Wash genitals (private parts) with your normal soap.  6.  Wash thoroughly, paying special attention to the area where your surgery will be performed.  7.  Thoroughly rinse your body with warm water from the neck down.  8.  DO NOT shower/wash with your normal  soap after using and rinsing off the CHG Soap.  9.  Pat yourself dry with a clean towel.            10.  Wear clean pajamas.            11.  Place clean sheets on your bed the night of your first shower and do not sleep with pets.  Day of Surgery  Do not apply any lotions/deoderants the morning of surgery.   Please wear clean clothes to the hospital/surgery center. Remember to brush your teeth with toothpaste.    Please read over the following fact sheets that you were given. Pain Booklet and Surgical Site Infection Prevention

## 2018-08-07 NOTE — Progress Notes (Signed)
IB to Dr. Marla Roe for orders.

## 2018-08-08 ENCOUNTER — Encounter: Payer: Self-pay | Admitting: Plastic Surgery

## 2018-08-08 ENCOUNTER — Encounter (HOSPITAL_COMMUNITY): Payer: Self-pay

## 2018-08-08 ENCOUNTER — Ambulatory Visit: Payer: Self-pay | Admitting: Plastic Surgery

## 2018-08-08 ENCOUNTER — Other Ambulatory Visit: Payer: Self-pay

## 2018-08-08 ENCOUNTER — Encounter (HOSPITAL_COMMUNITY)
Admission: RE | Admit: 2018-08-08 | Discharge: 2018-08-08 | Disposition: A | Discharge status: Home / Self Care | Payer: BC Managed Care – PPO | Source: Ambulatory Visit | Attending: General Surgery | Admitting: General Surgery

## 2018-08-08 VITALS — BP 130/78 | HR 62 | Resp 16 | Ht 67.5 in | Wt 203.0 lb

## 2018-08-08 DIAGNOSIS — Z01818 Encounter for other preprocedural examination: Secondary | ICD-10-CM | POA: Insufficient documentation

## 2018-08-08 DIAGNOSIS — D0512 Intraductal carcinoma in situ of left breast: Secondary | ICD-10-CM

## 2018-08-08 HISTORY — DX: Anxiety disorder, unspecified: F41.9

## 2018-08-08 LAB — BASIC METABOLIC PANEL
Anion gap: 9 (ref 5–15)
BUN: 10 mg/dL (ref 6–20)
CHLORIDE: 105 mmol/L (ref 98–111)
CO2: 25 mmol/L (ref 22–32)
Calcium: 9.1 mg/dL (ref 8.9–10.3)
Creatinine, Ser: 0.92 mg/dL (ref 0.44–1.00)
GFR calc Af Amer: >60 mL/min (ref >60)
GFR calc non Af Amer: >60 mL/min (ref >60)
Glucose, Bld: 98 mg/dL (ref 70–99)
POTASSIUM: 4.1 mmol/L (ref 3.5–5.1)
Sodium: 139 mmol/L (ref 135–145)

## 2018-08-08 LAB — CBC
HEMATOCRIT: 40.7 % (ref 36.0–46.0)
HEMOGLOBIN: 13.5 g/dL (ref 12.0–15.0)
MCH: 32.1 pg (ref 26.0–34.0)
MCHC: 33.2 g/dL (ref 30.0–36.0)
MCV: 96.9 fL (ref 80.0–100.0)
Platelets: 231 10*3/uL (ref 150–400)
RBC: 4.2 MIL/uL (ref 3.87–5.11)
RDW: 11.5 % (ref 11.5–15.5)
WBC: 6.2 10*3/uL (ref 4.0–10.5)
nRBC: 0 % (ref 0.0–0.2)

## 2018-08-08 MED ORDER — DIAZEPAM 2 MG PO TABS: 2.0000 mg | ORAL_TABLET | Freq: Three times a day (TID) | ORAL | 0 refills | Status: AC | PRN

## 2018-08-08 MED ORDER — HYDROCODONE-ACETAMINOPHEN 5-325 MG PO TABS: 1.0000 | ORAL_TABLET | Freq: Four times a day (QID) | ORAL | 0 refills | Status: AC | PRN

## 2018-08-08 MED ORDER — CEPHALEXIN 500 MG PO CAPS: 500.0000 mg | ORAL_CAPSULE | Freq: Four times a day (QID) | ORAL | 0 refills | Status: AC

## 2018-08-08 MED ORDER — ONDANSETRON HCL 4 MG PO TABS: 4.0000 mg | ORAL_TABLET | Freq: Three times a day (TID) | ORAL | 0 refills | Status: AC | PRN

## 2018-08-08 NOTE — Pre-Procedure Instructions (Addendum)
Shannon May  08/08/2018      [id number] - [place], [place] - [address] [place] [address] Phone: [phone] Fax: [phone]    Your procedure is scheduled on 08-14-2018  Monday   Report to [facility] Admitting   at 6:00 A.M.   Call this number if you have problems the morning of surgery:  [phone]   Remember:  Do not eat or drink after midnight.  You may drink clear liquids until 5:00 AM .  Clear liquids allowed are:                    Water, Juice (non-citric and without pulp), Carbonated beverages, Clear Tea, Black Coffee only and Gatorade    Take these medicines the morning of surgery with A SIP OF WATER  Alprazolam(Xanax) if needed Flonase nasal spray   STOP TAKING ANY ASPIRIN (UNLESS OTHERWISE INSTRUCTED BY YOUR SURGEON),ANTIINFLAMATORIES (IBUPROFEN,ALEVE,MOTRIN,ADVIL,GOODY'S POWDERS),HERBAL SUPPLEMENTS,FISH OIL,AND VITAMINS 5-7 DAYS PRIOR TO SURGERY    Do not wear jewelry, make-up or nail polish.  Do not wear lotions, powders, or perfumes, or deodorant.  Do not shave 48 hours prior to surgery..  Do not bring valuables to the hospital.   [facility] is not responsible for any belongings or valuables.  Contacts, dentures or bridgework may not be worn into surgery.  Leave your suitcase in the car.  After surgery it may be brought to your room.  For patients admitted to the hospital, discharge time will be determined by your treatment team.  Patients discharged the day of surgery will not be allowed to drive home.     [facility] - Preparing for Surgery  Before surgery, you can play an important role.  Because skin is not sterile, your skin needs to be as free of germs as possible.  You can reduce the number of germs on you skin by washing with CHG (chlorahexidine gluconate) soap before surgery.  CHG is an antiseptic cleaner which kills germs and bonds with the skin to continue killing germs even  after washing.  Oral Hygiene is also important in reducing the risk of infection.  Remember to brush your teeth with your regular toothpaste the morning of surgery.  Please DO NOT use if you have an allergy to CHG or antibacterial soaps.  If your skin becomes reddened/irritated stop using the CHG and inform your nurse when you arrive at Short Stay.  Do not shave (including legs and underarms) for at least 48 hours prior to the first CHG shower.  You may shave your face.  Please follow these instructions carefully:   1.  Shower with CHG Soap the night before surgery and the morning of Surgery.  2.  If you choose to wash your hair, wash your hair first as usual with your normal shampoo.  3.  After you shampoo, rinse your hair and body thoroughly to remove the shampoo. 4.  Use CHG as you would any other liquid soap.  You can apply chg directly to the skin and wash gently with a      scrungie or washcloth.           5.  Apply the CHG Soap to your body ONLY FROM THE NECK DOWN.   Do not use on open wounds or open sores. Avoid contact with your eyes, ears, mouth and genitals (private parts).  Wash genitals (private parts) with your normal soap.  6.  Wash  thoroughly, paying special attention to the area where your surgery will be performed.  7.  Thoroughly rinse your body with warm water from the neck down.  8.  DO NOT shower/wash with your normal soap after using and rinsing off the CHG Soap.  9.  Pat yourself dry with a clean towel.            10.  Wear clean pajamas.            11.  Place clean sheets on your bed the night of your first shower and do not sleep with pets.  Day of Surgery  Do not apply any lotions/deoderants the morning of surgery.   Please wear clean clothes to the hospital/surgery center. Remember to brush your teeth with toothpaste.   Please read over the following fact sheets that you were given. Pain Booklet, Coughing and Deep Breathing and Surgical Site Infection  Prevention

## 2018-08-08 NOTE — Progress Notes (Signed)
Patient ID: [name], female    DOB: 05/29/1971, 47 y.o.   MRN: [id number]   Chief Complaint  Patient presents with  . Breast Problem  . Breast Cancer    The patient is a 47 year old white female here with her mom for a history and physical for bilateral breast reconstruction.  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  Her preop bra size is 38 DD.  She would like to be around to see cup.  She had several questions today that we reviewed in detail.  She is scheduled for bilateral mastectomy with immediate reconstruction with expander and flex HD.  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   Review of Systems  Constitutional: Negative.   HENT: Negative.   Eyes: Negative.   Respiratory: Negative.   Cardiovascular: Negative.   Gastrointestinal: Negative.   Endocrine: Negative.   Genitourinary: Negative.   Musculoskeletal: Negative.   Skin: Negative.  Negative for color change and wound.  Neurological: Negative.   Hematological: Negative.   Psychiatric/Behavioral: Negative.     Past Medical History:  Diagnosis Date  . Anxiety   . Diverticulosis   . Hypertension 11/08/2017  . Malignant neoplasm of upper-outer quadrant of left breast in female, estrogen receptor positive ([place]) 06/08/2018  . Sinusitis 11/15/2017    Past Surgical History:  Procedure Laterality Date  . BREAST SURGERY    . DILATION AND CURETTAGE OF UTERUS    . LASIK        Current Outpatient Medications:  .  ALPRAZolam (XANAX) 0.5 MG tablet, Take 1-2 tablets (0.5-1 mg total) by mouth daily as needed for anxiety., Disp: 12 tablet, Rfl: 0 .  cephALEXin (KEFLEX) 500 MG capsule, Take  1 capsule (500 mg total) by mouth 4 (four) times daily for 5 days., Disp: 20 capsule, Rfl: 0 .  diazepam (VALIUM) 2 MG tablet, Take 1 tablet (2 mg total) by mouth every 8 (eight) hours as needed for up to 10 days for anxiety or muscle spasms., Disp: 30 tablet, Rfl: 0 .  Docusate Calcium (STOOL SOFTENER PO), Take 1 tablet by mouth 2 (two) times daily. , Disp: , Rfl:  .  fluticasone (FLONASE) 50 MCG/ACT nasal spray, PLACE 2 SPRAYS INTO THE NOSE DAILY (Patient taking differently: Place 2 sprays into both nostrils daily. ), Disp: 16 g, Rfl: 1 .  hydrochlorothiazide (MICROZIDE) 12.5 MG capsule, TAKE 1 CAPSULE BY MOUTH EVERY DAY (Patient taking differently: Take 12.5 mg by mouth daily. ), Disp: 90 capsule, Rfl: 0 .  HYDROcodone-acetaminophen (NORCO/VICODIN) 5-325 MG tablet, Take 1 tablet by mouth every 6 (six) hours as needed for up to 7 days for moderate pain., Disp: 20 tablet, Rfl: 0 .  loratadine (CLARITIN) 10 MG tablet, Take 10 mg by mouth daily., Disp: , Rfl:  .  Multiple Vitamins-Minerals (MULTIVITAMIN WOMEN PO), Take 1 capsule by mouth daily., Disp: , Rfl:  .  neomycin-polymyxin-hydrocortisone (CORTISPORIN) OTIC solution, Apply 1-2 drops to toe twice a day until all gone (Patient not taking: Reported on 07/31/2018), Disp: 10 mL, Rfl: 0 .  nitrofurantoin, macrocrystal-monohydrate, (MACROBID) 100 MG capsule,  Take 1 capsule (100 mg total) by mouth 2 (two) times daily. For 7 days. (Patient not taking: Reported on 07/31/2018), Disp: 14 capsule, Rfl: 0 .  ondansetron (ZOFRAN) 4 MG tablet, Take 1 tablet (4 mg total) by mouth every 8 (eight) hours as needed for up to 5 days for nausea or vomiting., Disp: 15 tablet, Rfl: 0 .  Probiotic Product (PRO-BIOTIC BLEND PO), Take 1 tablet by mouth daily. , Disp: , Rfl:  .  tamoxifen (NOLVADEX) 20 MG tablet, Take 1 tablet (20 mg total) by mouth daily., Disp: 90 tablet, Rfl: 3   Objective:   Vitals:   08/08/18 1505  BP: 130/78  Pulse: 62  Resp: 16  SpO2: 99%     Physical Exam  Constitutional: She is oriented to person, place, and time. She appears well-developed and well-nourished.  HENT:  Head: Normocephalic and atraumatic.  Eyes: Pupils are equal, round, and reactive to light. EOM are normal.  Cardiovascular: Normal rate.  Pulmonary/Chest: Effort normal.  Abdominal: Soft.  Neurological: She is alert and oriented to person, place, and time.  Skin: Skin is warm.  Psychiatric: She has a normal mood and affect. Her behavior is normal. Judgment and thought content normal.    Assessment & Plan:  Ductal carcinoma in situ (DCIS) of left breast  Plan for bilateral immediate breast reconstruction with expander and Flex HD placement. We discussed the risks and complications as detailed below and possible alternative plans if there is a complication or if radiation is needed. The risks that can be encountered with and after placement of a breast expander placement were discussed and include the following but not limited to these: bleeding, infection, delayed healing, anesthesia risks, skin sensation changes, injury to structures including nerves, blood vessels, and muscles which may be temporary or permanent, allergies to tape, suture materials and glues, blood products, topical preparations or injected agents, skin contour irregularities, skin discoloration and swelling, deep vein thrombosis, cardiac and pulmonary complications, pain, which may persist, fluid accumulation, wrinkling of the skin over the expander, changes in nipple or breast sensation, expander leakage or rupture, faulty position of the expander, persistent pain, formation of tight scar tissue around the expander (capsular contracture), possible need for revisional surgery or staged procedures.  \  Loel Lofty Myan Locatelli, DO

## 2018-08-08 NOTE — Progress Notes (Signed)
PCP Beatrice Lecher MD

## 2018-08-08 NOTE — H&P (View-Only) (Signed)
Patient ID: Shannon May, female    DOB: 10-19-1970, 47 y.o.   MRN: [id number]   Chief Complaint  Patient presents with  . Breast Problem  . Breast Cancer    The patient is a 47 year old white female here with her mom for a history and physical for bilateral breast reconstruction.  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  Her preop bra size is 38 DD.  She would like to be around to see cup.  She had several questions today that we reviewed in detail.  She is scheduled for bilateral mastectomy with immediate reconstruction with expander and flex HD.  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   Review of Systems  Constitutional: Negative.   HENT: Negative.   Eyes: Negative.   Respiratory: Negative.   Cardiovascular: Negative.   Gastrointestinal: Negative.   Endocrine: Negative.   Genitourinary: Negative.   Musculoskeletal: Negative.   Skin: Negative.  Negative for color change and wound.  Neurological: Negative.   Hematological: Negative.   Psychiatric/Behavioral: Negative.     Past Medical History:  Diagnosis Date  . Anxiety   . Diverticulosis   . Hypertension 11/08/2017  . Malignant neoplasm of upper-outer quadrant of left breast in female, estrogen receptor positive ([place]) 06/08/2018  . Sinusitis 11/15/2017    Past Surgical History:  Procedure Laterality Date  . BREAST SURGERY    . DILATION AND CURETTAGE OF UTERUS    . LASIK        Current Outpatient Medications:  .  ALPRAZolam (XANAX) 0.5 MG tablet, Take 1-2 tablets (0.5-1 mg total) by mouth daily as needed for anxiety., Disp: 12 tablet, Rfl: 0 .  cephALEXin (KEFLEX) 500 MG capsule, Take  1 capsule (500 mg total) by mouth 4 (four) times daily for 5 days., Disp: 20 capsule, Rfl: 0 .  diazepam (VALIUM) 2 MG tablet, Take 1 tablet (2 mg total) by mouth every 8 (eight) hours as needed for up to 10 days for anxiety or muscle spasms., Disp: 30 tablet, Rfl: 0 .  Docusate Calcium (STOOL SOFTENER PO), Take 1 tablet by mouth 2 (two) times daily. , Disp: , Rfl:  .  fluticasone (FLONASE) 50 MCG/ACT nasal spray, PLACE 2 SPRAYS INTO THE NOSE DAILY (Patient taking differently: Place 2 sprays into both nostrils daily. ), Disp: 16 g, Rfl: 1 .  hydrochlorothiazide (MICROZIDE) 12.5 MG capsule, TAKE 1 CAPSULE BY MOUTH EVERY DAY (Patient taking differently: Take 12.5 mg by mouth daily. ), Disp: 90 capsule, Rfl: 0 .  HYDROcodone-acetaminophen (NORCO/VICODIN) 5-325 MG tablet, Take 1 tablet by mouth every 6 (six) hours as needed for up to 7 days for moderate pain., Disp: 20 tablet, Rfl: 0 .  loratadine (CLARITIN) 10 MG tablet, Take 10 mg by mouth daily., Disp: , Rfl:  .  Multiple Vitamins-Minerals (MULTIVITAMIN WOMEN PO), Take 1 capsule by mouth daily., Disp: , Rfl:  .  neomycin-polymyxin-hydrocortisone (CORTISPORIN) OTIC solution, Apply 1-2 drops to toe twice a day until all gone (Patient not taking: Reported on 07/31/2018), Disp: 10 mL, Rfl: 0 .  nitrofurantoin, macrocrystal-monohydrate, (MACROBID) 100 MG capsule,  Take 1 capsule (100 mg total) by mouth 2 (two) times daily. For 7 days. (Patient not taking: Reported on 07/31/2018), Disp: 14 capsule, Rfl: 0 .  ondansetron (ZOFRAN) 4 MG tablet, Take 1 tablet (4 mg total) by mouth every 8 (eight) hours as needed for up to 5 days for nausea or vomiting., Disp: 15 tablet, Rfl: 0 .  Probiotic Product (PRO-BIOTIC BLEND PO), Take 1 tablet by mouth daily. , Disp: , Rfl:  .  tamoxifen (NOLVADEX) 20 MG tablet, Take 1 tablet (20 mg total) by mouth daily., Disp: 90 tablet, Rfl: 3   Objective:   Vitals:   08/08/18 1505  BP: 130/78  Pulse: 62  Resp: 16  SpO2: 99%     Physical Exam  Constitutional: She is oriented to person, place, and time. She appears well-developed and well-nourished.  HENT:  Head: Normocephalic and atraumatic.  Eyes: Pupils are equal, round, and reactive to light. EOM are normal.  Cardiovascular: Normal rate.  Pulmonary/Chest: Effort normal.  Abdominal: Soft.  Neurological: She is alert and oriented to person, place, and time.  Skin: Skin is warm.  Psychiatric: She has a normal mood and affect. Her behavior is normal. Judgment and thought content normal.    Assessment & Plan:  Ductal carcinoma in situ (DCIS) of left breast  Plan for bilateral immediate breast reconstruction with expander and Flex HD placement. We discussed the risks and complications as detailed below and possible alternative plans if there is a complication or if radiation is needed. The risks that can be encountered with and after placement of a breast expander placement were discussed and include the following but not limited to these: bleeding, infection, delayed healing, anesthesia risks, skin sensation changes, injury to structures including nerves, blood vessels, and muscles which may be temporary or permanent, allergies to tape, suture materials and glues, blood products, topical preparations or injected agents, skin contour irregularities, skin discoloration and swelling, deep vein thrombosis, cardiac and pulmonary complications, pain, which may persist, fluid accumulation, wrinkling of the skin over the expander, changes in nipple or breast sensation, expander leakage or rupture, faulty position of the expander, persistent pain, formation of tight scar tissue around the expander (capsular contracture), possible need for revisional surgery or staged procedures.  \  Loel Lofty [place], DO

## 2018-08-10 NOTE — Progress Notes (Signed)
Patient Care Team: Hali Marry, MD as PCP - General  DIAGNOSIS:    ICD-10-CM   1. Malignant neoplasm of upper-outer quadrant of left breast in female, estrogen receptor positive ([place]) C50.412    Z17.0     SUMMARY OF ONCOLOGIC HISTORY:   Malignant neoplasm of upper-outer quadrant of left breast in female, estrogen receptor positive ([place])   05/31/2018 Initial Diagnosis    Screening mammogram detected left breast calcifications measuring 4.5 cm in the UOQ left breast.  Biopsy revealed low-grade DCIS.  That was ER PR 100% positive.  Tis NX stage 0      06/08/2018 Breast MRI    Segmental non-mass enhancement over the left UOQ, spanning 3.1 x 7.8 x 2.3 cm. There is a separate 2.4 cm focus of linear non-mass enhancement over the left LOQ. There is also a subtle patchy non-mass enhancement over the right UOQ with most suspicious focus measuring 2.1 cm. No axillary adenopathy.    08/14/2018 Surgery    Left mastectomy: DCIS low-grade, 6 cm, margins negative, 0/3 lymph nodes negative ER 100%, PR 100%, Tis N0 stage 0, right mastectomy prophylactic: PASH     CHIEF COMPLIANT: Follow-up of left breast DCIS s/p bilateral mastectomies  INTERVAL HISTORY: Shannon May is a 47 y.o. with above-mentioned history of left breast DCIS. She had bilateral mastectomies on 08/14/18 for which pathology showed low grade, 6.0cm, ER/PR positive DCIS in the left breast, PASH in the right breast, and all three lymph nodes were negative for carcinoma. She presents to the clinic today with her husband and mother and notes she is doing well despite pain and discomfort following surgery. She notes she is relieved after the surgery and the pathology report and feels she made the right decision to get bilateral mastectomies. The patient expressed concern about getting a hysterectomy to prevent uterine cancer.   REVIEW OF SYSTEMS:   Constitutional: Denies fevers, chills or abnormal weight loss Eyes: Denies  blurriness of vision Ears, nose, mouth, throat, and face: Denies mucositis or sore throat Respiratory: Denies cough, dyspnea or wheezes Cardiovascular: Denies palpitation, chest discomfort Gastrointestinal:  Denies nausea, heartburn or change in bowel habits Skin: Denies abnormal skin rashes Lymphatics: Denies new lymphadenopathy or easy bruising Neurological:Denies numbness, tingling or new weaknesses Behavioral/Psych: Mood is stable, no new changes  Extremities: No lower extremity edema Breast: (+) pain in chest at surgical site All other systems were reviewed with the patient and are negative.  I have reviewed the past medical history, past surgical history, social history and family history with the patient and they are unchanged from previous note.  ALLERGIES:  is allergic to antihistamines, chlorpheniramine-type; sudafed [pseudoephedrine hcl]; sulfamethoxazole; and erythromycin.  MEDICATIONS:  Current Outpatient Medications  Medication Sig Dispense Refill  . ALPRAZolam (XANAX) 0.5 MG tablet Take 1-2 tablets (0.5-1 mg total) by mouth daily as needed for anxiety. (Patient not taking: Reported on 08/22/2018) 12 tablet 0  . Docusate Calcium (STOOL SOFTENER PO) Take 1 tablet by mouth 2 (two) times daily.     . fluticasone (FLONASE) 50 MCG/ACT nasal spray PLACE 2 SPRAYS INTO THE NOSE DAILY (Patient taking differently: Place 2 sprays into both nostrils daily. ) 16 g 1  . hydrochlorothiazide (MICROZIDE) 12.5 MG capsule TAKE 1 CAPSULE BY MOUTH EVERY DAY (Patient taking differently: Take 12.5 mg by mouth daily. ) 90 capsule 0  . loratadine (CLARITIN) 10 MG tablet Take 10 mg by mouth daily.    . Multiple Vitamins-Minerals (MULTIVITAMIN WOMEN PO)  Take 1 capsule by mouth daily.    . Probiotic Product (PRO-BIOTIC BLEND PO) Take 1 tablet by mouth daily.     . tamoxifen (NOLVADEX) 20 MG tablet Take 1 tablet (20 mg total) by mouth daily. (Patient not taking: Reported on 08/22/2018) 90 tablet 3   No  current facility-administered medications for this visit.     PHYSICAL EXAMINATION: ECOG PERFORMANCE STATUS: 2 - Symptomatic, <50% confined to bed  Vitals:   08/22/18 1428  BP: 105/73  Pulse: 64  Resp: 18  Temp: 98.2 F (36.8 C)  SpO2: 99%   Filed Weights   08/22/18 1428  Weight: 206 lb 3.2 oz (93.5 kg)    GENERAL:alert, no distress and comfortable SKIN: skin color, texture, turgor are normal, no rashes or significant lesions EYES: normal, Conjunctiva are pink and non-injected, sclera clear OROPHARYNX:no exudate, no erythema and lips, buccal mucosa, and tongue normal  NECK: supple, thyroid normal size, non-tender, without nodularity LYMPH:  no palpable lymphadenopathy in the cervical, axillary or inguinal LUNGS: clear to auscultation and percussion with normal breathing effort HEART: regular rate & rhythm and no murmurs and no lower extremity edema ABDOMEN:abdomen soft, non-tender and normal bowel sounds MUSCULOSKELETAL:no cyanosis of digits and no clubbing  NEURO: alert & oriented x 3 with fluent speech, no focal motor/sensory deficits EXTREMITIES: No lower extremity edema  LABORATORY DATA:  I have reviewed the data as listed CMP Latest Ref Rng & Units 08/08/2018 05/11/2018 12/02/2017  Glucose 70 - 99 mg/dL 98 93 92  BUN 6 - 20 mg/dL 10 12 14   Creatinine 0.44 - 1.00 mg/dL 0.92 0.98 0.72  Sodium 135 - 145 mmol/L 139 137 137  Potassium 3.5 - 5.1 mmol/L 4.1 3.4(L) 4.2  Chloride 98 - 111 mmol/L 105 100 105  CO2 22 - 32 mmol/L 25 28 28   Calcium 8.9 - 10.3 mg/dL 9.1 9.5 9.4  Total Protein 6.1 - 8.1 g/dL - - 6.8  Total Bilirubin 0.2 - 1.2 mg/dL - - 0.4  Alkaline Phos 33 - 115 U/L - - -  AST 10 - 35 U/L - - 16  ALT 6 - 29 U/L - - 14    Lab Results  Component Value Date   WBC 6.2 08/08/2018   HGB 13.5 08/08/2018   HCT 40.7 08/08/2018   MCV 96.9 08/08/2018   PLT 231 08/08/2018   NEUTROABS 4,453 12/08/2016    ASSESSMENT & PLAN:  Malignant neoplasm of upper-outer  quadrant of left breast in female, estrogen receptor positive ([place]) 08/14/2018:Left mastectomy: DCIS low-grade, 6 cm, margins negative, 0/3 lymph nodes negative ER 100%, PR 100%, Tis N0 stage 0, right mastectomy prophylactic: [place]  Pathology counseling: I discussed the final pathology report of the patient provided  a copy of this report. I discussed the margins as well as lymph node surgeries. We also discussed the final staging along with previously performed ER/PR testing.  Since she had bilateral mastectomies, there is no role of adjuvant radiation or adjuvant antiestrogen therapy.  I instructed her to discontinue tamoxifen.  She would like to follow-up with Dr. Marlou Starks for her annual checkups. We are happy to see her on an as-needed basis.     No orders of the defined types were placed in this encounter.  The patient has a good understanding of the overall plan. she agrees with it. she will call with any problems that may develop before the next visit here.  Nicholas Lose, MD 08/22/2018   Julious Oka Dorshimer, am acting  as scribe for Nicholas Lose, MD.  I have reviewed the above documentation for accuracy and completeness, and I agree with the above.

## 2018-08-14 ENCOUNTER — Ambulatory Visit (HOSPITAL_COMMUNITY): Payer: BC Managed Care – PPO | Admitting: Certified Registered Nurse Anesthetist

## 2018-08-14 ENCOUNTER — Other Ambulatory Visit: Payer: Self-pay

## 2018-08-14 ENCOUNTER — Observation Stay (HOSPITAL_COMMUNITY)
Admission: RE | Admit: 2018-08-14 | Discharge: 2018-08-15 | Disposition: A | Discharge status: Home / Self Care | Payer: BC Managed Care – PPO | Source: Ambulatory Visit | Attending: Plastic Surgery | Admitting: Plastic Surgery

## 2018-08-14 ENCOUNTER — Encounter (HOSPITAL_COMMUNITY)
Admission: RE | Admit: 2018-08-14 | Discharge: 2018-08-14 | Disposition: A | Discharge status: Home / Self Care | Payer: BC Managed Care – PPO | Source: Ambulatory Visit | Attending: General Surgery | Admitting: General Surgery

## 2018-08-14 ENCOUNTER — Encounter (HOSPITAL_COMMUNITY)
Admission: RE | Disposition: A | Discharge status: Home / Self Care | Payer: Self-pay | Source: Ambulatory Visit | Attending: Plastic Surgery

## 2018-08-14 ENCOUNTER — Encounter (HOSPITAL_COMMUNITY): Payer: Self-pay | Admitting: Certified Registered Nurse Anesthetist

## 2018-08-14 DIAGNOSIS — C50912 Malignant neoplasm of unspecified site of left female breast: Secondary | ICD-10-CM | POA: Diagnosis not present

## 2018-08-14 DIAGNOSIS — Z79899 Other long term (current) drug therapy: Secondary | ICD-10-CM | POA: Insufficient documentation

## 2018-08-14 DIAGNOSIS — C50919 Malignant neoplasm of unspecified site of unspecified female breast: Secondary | ICD-10-CM | POA: Diagnosis present

## 2018-08-14 DIAGNOSIS — D0512 Intraductal carcinoma in situ of left breast: Secondary | ICD-10-CM | POA: Diagnosis not present

## 2018-08-14 DIAGNOSIS — F419 Anxiety disorder, unspecified: Secondary | ICD-10-CM | POA: Diagnosis not present

## 2018-08-14 DIAGNOSIS — Z882 Allergy status to sulfonamides status: Secondary | ICD-10-CM | POA: Insufficient documentation

## 2018-08-14 DIAGNOSIS — I10 Essential (primary) hypertension: Secondary | ICD-10-CM | POA: Insufficient documentation

## 2018-08-14 HISTORY — PX: MASTECTOMY W/ SENTINEL NODE BIOPSY: SHX2001

## 2018-08-14 HISTORY — PX: BREAST RECONSTRUCTION WITH PLACEMENT OF TISSUE EXPANDER AND ALLODERM: SHX6805

## 2018-08-14 LAB — POCT PREGNANCY, URINE: PREG TEST UR: NEGATIVE

## 2018-08-14 SURGERY — MASTECTOMY WITH SENTINEL LYMPH NODE BIOPSY
Anesthesia: Regional | Site: Breast

## 2018-08-14 MED ORDER — DIPHENHYDRAMINE HCL 50 MG/ML IJ SOLN: 12.5000 mg | Freq: Four times a day (QID) | INTRAMUSCULAR | Status: DC | PRN

## 2018-08-14 MED ORDER — CEFAZOLIN SODIUM-DEXTROSE 2-4 GM/100ML-% IV SOLN: 2.0000 g | INTRAVENOUS | Status: DC

## 2018-08-14 MED ORDER — ONDANSETRON 4 MG PO TBDP: 4.0000 mg | ORAL_TABLET | Freq: Four times a day (QID) | ORAL | Status: DC | PRN

## 2018-08-14 MED ORDER — PROPOFOL 500 MG/50ML IV EMUL
INTRAVENOUS | Status: DC | PRN
  Administered 2018-08-14: 100 ug/kg/min via INTRAVENOUS

## 2018-08-14 MED ORDER — KCL IN DEXTROSE-NACL 20-5-0.45 MEQ/L-%-% IV SOLN
INTRAVENOUS | Status: DC
  Administered 2018-08-14 – 2018-08-15 (×2): via INTRAVENOUS
  Filled 2018-08-14 (×2): qty 1000

## 2018-08-14 MED ORDER — CEFAZOLIN SODIUM-DEXTROSE 2-4 GM/100ML-% IV SOLN
2.0000 g | Freq: Three times a day (TID) | INTRAVENOUS | Status: DC
  Administered 2018-08-14 – 2018-08-15 (×3): 2 g via INTRAVENOUS
  Filled 2018-08-14 (×4): qty 100

## 2018-08-14 MED ORDER — TECHNETIUM TC 99M SULFUR COLLOID FILTERED: 1.0000 | Freq: Once | INTRAVENOUS | Status: DC | PRN

## 2018-08-14 MED ORDER — SUCCINYLCHOLINE CHLORIDE 20 MG/ML IJ SOLN
INTRAMUSCULAR | Status: DC | PRN
  Administered 2018-08-14: 70 mg via INTRAVENOUS

## 2018-08-14 MED ORDER — DEXAMETHASONE SODIUM PHOSPHATE 10 MG/ML IJ SOLN
INTRAMUSCULAR | Status: DC | PRN
  Administered 2018-08-14: 10 mg via INTRAVENOUS

## 2018-08-14 MED ORDER — FENTANYL CITRATE (PF) 100 MCG/2ML IJ SOLN
INTRAMUSCULAR | Status: AC
  Filled 2018-08-14: qty 2

## 2018-08-14 MED ORDER — ACETAMINOPHEN 10 MG/ML IV SOLN: 1000.0000 mg | Freq: Once | INTRAVENOUS | Status: DC | PRN

## 2018-08-14 MED ORDER — SODIUM CHLORIDE 0.9 % IV SOLN
INTRAVENOUS | Status: DC | PRN
  Administered 2018-08-14: 25 ug/min via INTRAVENOUS

## 2018-08-14 MED ORDER — CHLORHEXIDINE GLUCONATE CLOTH 2 % EX PADS: 6.0000 | MEDICATED_PAD | Freq: Once | CUTANEOUS | Status: DC

## 2018-08-14 MED ORDER — ONDANSETRON HCL 4 MG/2ML IJ SOLN
4.0000 mg | Freq: Four times a day (QID) | INTRAMUSCULAR | Status: DC | PRN
  Administered 2018-08-14: 4 mg via INTRAVENOUS
  Filled 2018-08-14: qty 2

## 2018-08-14 MED ORDER — OXYCODONE HCL 5 MG/5ML PO SOLN: 5.0000 mg | Freq: Once | ORAL | Status: DC | PRN

## 2018-08-14 MED ORDER — HYDROMORPHONE HCL 1 MG/ML IJ SOLN: 1.0000 mg | INTRAMUSCULAR | Status: DC | PRN

## 2018-08-14 MED ORDER — SENNA 8.6 MG PO TABS
1.0000 | ORAL_TABLET | Freq: Two times a day (BID) | ORAL | Status: DC
  Administered 2018-08-14 – 2018-08-15 (×3): 8.6 mg via ORAL
  Filled 2018-08-14 (×3): qty 1

## 2018-08-14 MED ORDER — PROPOFOL 10 MG/ML IV BOLUS
INTRAVENOUS | Status: AC
  Filled 2018-08-14: qty 20

## 2018-08-14 MED ORDER — ACETAMINOPHEN 500 MG PO TABS
1000.0000 mg | ORAL_TABLET | ORAL | Status: AC
  Administered 2018-08-14: 1000 mg via ORAL
  Filled 2018-08-14: qty 2

## 2018-08-14 MED ORDER — 0.9 % SODIUM CHLORIDE (POUR BTL) OPTIME
TOPICAL | Status: DC | PRN
  Administered 2018-08-14: 1000 mL

## 2018-08-14 MED ORDER — ONDANSETRON HCL 4 MG/2ML IJ SOLN
INTRAMUSCULAR | Status: AC
  Filled 2018-08-14: qty 4

## 2018-08-14 MED ORDER — MIDAZOLAM HCL 2 MG/2ML IJ SOLN
INTRAMUSCULAR | Status: AC
  Filled 2018-08-14: qty 2

## 2018-08-14 MED ORDER — GABAPENTIN 300 MG PO CAPS
300.0000 mg | ORAL_CAPSULE | ORAL | Status: AC
  Administered 2018-08-14: 300 mg via ORAL
  Filled 2018-08-14: qty 1

## 2018-08-14 MED ORDER — PROPOFOL 1000 MG/100ML IV EMUL
INTRAVENOUS | Status: AC
  Filled 2018-08-14: qty 200

## 2018-08-14 MED ORDER — FENTANYL CITRATE (PF) 100 MCG/2ML IJ SOLN
25.0000 ug | INTRAMUSCULAR | Status: DC | PRN
  Administered 2018-08-14 (×2): 25 ug via INTRAVENOUS

## 2018-08-14 MED ORDER — TECHNETIUM TC 99M SULFUR COLLOID FILTERED
1.0000 | Freq: Once | INTRAVENOUS | Status: AC | PRN
  Administered 2018-08-14: 1 via INTRADERMAL

## 2018-08-14 MED ORDER — DEXAMETHASONE SODIUM PHOSPHATE 10 MG/ML IJ SOLN
INTRAMUSCULAR | Status: AC
  Filled 2018-08-14: qty 1

## 2018-08-14 MED ORDER — LIDOCAINE 2% (20 MG/ML) 5 ML SYRINGE
INTRAMUSCULAR | Status: AC
  Filled 2018-08-14: qty 5

## 2018-08-14 MED ORDER — LIDOCAINE 2% (20 MG/ML) 5 ML SYRINGE
INTRAMUSCULAR | Status: DC | PRN
  Administered 2018-08-14: 60 mg via INTRAVENOUS

## 2018-08-14 MED ORDER — SODIUM CHLORIDE 0.9 % IV SOLN
INTRAVENOUS | Status: AC
  Filled 2018-08-14: qty 500000

## 2018-08-14 MED ORDER — BUPIVACAINE-EPINEPHRINE (PF) 0.25% -1:200000 IJ SOLN
INTRAMUSCULAR | Status: DC | PRN
  Administered 2018-08-14 (×2): 15 mL via PERINEURAL

## 2018-08-14 MED ORDER — FENTANYL CITRATE (PF) 250 MCG/5ML IJ SOLN
INTRAMUSCULAR | Status: DC | PRN
  Administered 2018-08-14: 100 ug via INTRAVENOUS
  Administered 2018-08-14: 50 ug via INTRAVENOUS
  Administered 2018-08-14: 25 ug via INTRAVENOUS
  Administered 2018-08-14: 50 ug via INTRAVENOUS
  Administered 2018-08-14: 25 ug via INTRAVENOUS

## 2018-08-14 MED ORDER — DIAZEPAM 2 MG PO TABS
2.0000 mg | ORAL_TABLET | Freq: Two times a day (BID) | ORAL | Status: DC | PRN
  Administered 2018-08-14 – 2018-08-15 (×2): 2 mg via ORAL
  Filled 2018-08-14 (×2): qty 1

## 2018-08-14 MED ORDER — GLYCOPYRROLATE PF 0.2 MG/ML IJ SOSY
PREFILLED_SYRINGE | INTRAMUSCULAR | Status: AC
  Filled 2018-08-14: qty 1

## 2018-08-14 MED ORDER — SUCCINYLCHOLINE CHLORIDE 200 MG/10ML IV SOSY
PREFILLED_SYRINGE | INTRAVENOUS | Status: AC
  Filled 2018-08-14: qty 10

## 2018-08-14 MED ORDER — GLYCOPYRROLATE PF 0.2 MG/ML IJ SOSY
PREFILLED_SYRINGE | INTRAMUSCULAR | Status: DC | PRN
  Administered 2018-08-14: .2 mg via INTRAVENOUS

## 2018-08-14 MED ORDER — LACTATED RINGERS IV SOLN
INTRAVENOUS | Status: DC
  Administered 2018-08-14 (×2): via INTRAVENOUS

## 2018-08-14 MED ORDER — ACETAMINOPHEN 500 MG PO TABS
500.0000 mg | ORAL_TABLET | Freq: Four times a day (QID) | ORAL | Status: DC | PRN
  Administered 2018-08-14: 500 mg via ORAL
  Filled 2018-08-14: qty 1

## 2018-08-14 MED ORDER — MIDAZOLAM HCL 2 MG/2ML IJ SOLN
INTRAMUSCULAR | Status: DC | PRN
  Administered 2018-08-14 (×2): 1 mg via INTRAVENOUS

## 2018-08-14 MED ORDER — CEFAZOLIN SODIUM-DEXTROSE 2-4 GM/100ML-% IV SOLN
2.0000 g | INTRAVENOUS | Status: AC
  Administered 2018-08-14: 2 g via INTRAVENOUS
  Filled 2018-08-14: qty 100

## 2018-08-14 MED ORDER — KETAMINE HCL 10 MG/ML IJ SOLN
INTRAMUSCULAR | Status: DC | PRN
  Administered 2018-08-14: 10 mg via INTRAVENOUS
  Administered 2018-08-14: 20 mg via INTRAVENOUS
  Administered 2018-08-14: 10 mg via INTRAVENOUS

## 2018-08-14 MED ORDER — HYDROCODONE-ACETAMINOPHEN 5-325 MG PO TABS
1.0000 | ORAL_TABLET | ORAL | Status: DC | PRN
  Administered 2018-08-14 – 2018-08-15 (×5): 2 via ORAL
  Filled 2018-08-14 (×5): qty 2

## 2018-08-14 MED ORDER — SODIUM CHLORIDE 0.9 % IV SOLN
INTRAVENOUS | Status: DC | PRN
  Administered 2018-08-14: 500 mL

## 2018-08-14 MED ORDER — BUPIVACAINE HCL (PF) 0.5 % IJ SOLN
INTRAMUSCULAR | Status: DC | PRN
  Administered 2018-08-14 (×2): 15 mL via PERINEURAL

## 2018-08-14 MED ORDER — KETAMINE HCL 50 MG/5ML IJ SOSY
PREFILLED_SYRINGE | INTRAMUSCULAR | Status: AC
  Filled 2018-08-14: qty 5

## 2018-08-14 MED ORDER — DIPHENHYDRAMINE HCL 12.5 MG/5ML PO ELIX: 12.5000 mg | ORAL_SOLUTION | Freq: Four times a day (QID) | ORAL | Status: DC | PRN

## 2018-08-14 MED ORDER — ACETAMINOPHEN 500 MG PO TABS: 1000.0000 mg | ORAL_TABLET | Freq: Once | ORAL | Status: DC | PRN

## 2018-08-14 MED ORDER — FENTANYL CITRATE (PF) 250 MCG/5ML IJ SOLN
INTRAMUSCULAR | Status: AC
  Filled 2018-08-14: qty 5

## 2018-08-14 MED ORDER — POLYETHYLENE GLYCOL 3350 17 G PO PACK: 17.0000 g | PACK | Freq: Every day | ORAL | Status: DC | PRN

## 2018-08-14 MED ORDER — OXYCODONE HCL 5 MG PO TABS: 5.0000 mg | ORAL_TABLET | Freq: Once | ORAL | Status: DC | PRN

## 2018-08-14 MED ORDER — PROPOFOL 10 MG/ML IV BOLUS
INTRAVENOUS | Status: DC | PRN
  Administered 2018-08-14: 110 mg via INTRAVENOUS

## 2018-08-14 MED ORDER — ONDANSETRON HCL 4 MG/2ML IJ SOLN
INTRAMUSCULAR | Status: DC | PRN
  Administered 2018-08-14: 4 mg via INTRAVENOUS

## 2018-08-14 MED ORDER — METHYLENE BLUE 0.5 % INJ SOLN
INTRAVENOUS | Status: AC
  Filled 2018-08-14: qty 10

## 2018-08-14 MED ORDER — CLONIDINE HCL (ANALGESIA) 100 MCG/ML EP SOLN
EPIDURAL | Status: DC | PRN
  Administered 2018-08-14 (×2): 50 ug

## 2018-08-14 MED ORDER — ACETAMINOPHEN 160 MG/5ML PO SOLN: 1000.0000 mg | Freq: Once | ORAL | Status: DC | PRN

## 2018-08-14 SURGICAL SUPPLY — 87 items
APPLIER CLIP 11 MED OPEN (CLIP) ×3
APPLIER CLIP 9.375 MED OPEN (MISCELLANEOUS) ×3
BAG DECANTER FOR FLEXI CONT (MISCELLANEOUS) ×3 IMPLANT
BINDER BREAST LRG (GAUZE/BANDAGES/DRESSINGS) IMPLANT
BINDER BREAST XLRG (GAUZE/BANDAGES/DRESSINGS) IMPLANT
BINDER BREAST XXLRG (GAUZE/BANDAGES/DRESSINGS) ×1 IMPLANT
BIOPATCH RED 1 DISK 7.0 (GAUZE/BANDAGES/DRESSINGS) ×8 IMPLANT
CANISTER SUCT 3000ML PPV (MISCELLANEOUS) ×6 IMPLANT
CHLORAPREP W/TINT 26ML (MISCELLANEOUS) ×6 IMPLANT
CLIP APPLIE 11 MED OPEN (CLIP) IMPLANT
CLIP APPLIE 9.375 MED OPEN (MISCELLANEOUS) ×2 IMPLANT
CONT SPEC 4OZ CLIKSEAL STRL BL (MISCELLANEOUS) ×3 IMPLANT
COVER PROBE W GEL 5X96 (DRAPES) ×3 IMPLANT
COVER SURGICAL LIGHT HANDLE (MISCELLANEOUS) ×6 IMPLANT
COVER WAND RF STERILE (DRAPES) ×4 IMPLANT
DERMABOND ADVANCED (GAUZE/BANDAGES/DRESSINGS) ×2
DERMABOND ADVANCED .7 DNX12 (GAUZE/BANDAGES/DRESSINGS) ×4 IMPLANT
DEVICE DISSECT PLASMABLAD 3.0S (MISCELLANEOUS) IMPLANT
DRAIN CHANNEL 19F RND (DRAIN) ×6 IMPLANT
DRAPE CHEST BREAST 15X10 FENES (DRAPES) ×3 IMPLANT
DRAPE HALF SHEET 40X57 (DRAPES) ×3 IMPLANT
DRAPE ORTHO SPLIT 77X108 STRL (DRAPES) ×2
DRAPE SURG 17X23 STRL (DRAPES) ×12 IMPLANT
DRAPE SURG ORHT 6 SPLT 77X108 (DRAPES) ×4 IMPLANT
DRAPE WARM FLUID 44X44 (DRAPE) ×3 IMPLANT
DRSG PAD ABDOMINAL 8X10 ST (GAUZE/BANDAGES/DRESSINGS) ×2 IMPLANT
DRSG TEGADERM 4X4.75 (GAUZE/BANDAGES/DRESSINGS) ×4 IMPLANT
ELECT BLADE 4.0 EZ CLEAN MEGAD (MISCELLANEOUS)
ELECT BLADE 6.5 EXT (BLADE) ×1 IMPLANT
ELECT CAUTERY BLADE 6.4 (BLADE) ×4 IMPLANT
ELECT REM PT RETURN 9FT ADLT (ELECTROSURGICAL) ×6
ELECTRODE BLDE 4.0 EZ CLN MEGD (MISCELLANEOUS) IMPLANT
ELECTRODE REM PT RTRN 9FT ADLT (ELECTROSURGICAL) ×4 IMPLANT
EVACUATOR SILICONE 100CC (DRAIN) ×6 IMPLANT
GAUZE SPONGE 4X4 12PLY STRL (GAUZE/BANDAGES/DRESSINGS) ×4 IMPLANT
GLOVE BIO SURGEON STRL SZ 6.5 (GLOVE) ×7 IMPLANT
GLOVE BIO SURGEON STRL SZ7.5 (GLOVE) ×3 IMPLANT
GLOVE ECLIPSE 7.0 STRL STRAW (GLOVE) ×1 IMPLANT
GLOVE ECLIPSE 7.5 STRL STRAW (GLOVE) ×1 IMPLANT
GOWN STRL REUS W/ TWL LRG LVL3 (GOWN DISPOSABLE) ×8 IMPLANT
GOWN STRL REUS W/TWL LRG LVL3 (GOWN DISPOSABLE) ×4
GRAFT FLEX HD 4X16 THICK (Tissue Mesh) ×2 IMPLANT
IMPL EXPANDER BREAST 535CC (Breast) IMPLANT
IMPLANT BREAST 535CC (Breast) ×2 IMPLANT
IMPLANT EXPANDER BREAST 535CC (Breast) ×4 IMPLANT
KIT BASIN OR (CUSTOM PROCEDURE TRAY) ×6 IMPLANT
KIT TURNOVER KIT B (KITS) ×6 IMPLANT
LIGHT WAVEGUIDE WIDE FLAT (MISCELLANEOUS) ×1 IMPLANT
NDL 18GX1X1/2 (RX/OR ONLY) (NEEDLE) IMPLANT
NDL 21 GA WING INFUSION (NEEDLE) IMPLANT
NDL FILTER BLUNT 18X1 1/2 (NEEDLE) IMPLANT
NDL HYPO 25GX1X1/2 BEV (NEEDLE) IMPLANT
NEEDLE 18GX1X1/2 (RX/OR ONLY) (NEEDLE) IMPLANT
NEEDLE 21 GA WING INFUSION (NEEDLE) ×3 IMPLANT
NEEDLE FILTER BLUNT 18X 1/2SAF (NEEDLE)
NEEDLE FILTER BLUNT 18X1 1/2 (NEEDLE) IMPLANT
NEEDLE HYPO 25GX1X1/2 BEV (NEEDLE) ×3 IMPLANT
NS IRRIG 1000ML POUR BTL (IV SOLUTION) ×9 IMPLANT
PACK GENERAL/GYN (CUSTOM PROCEDURE TRAY) ×5 IMPLANT
PAD ABD 8X10 STRL (GAUZE/BANDAGES/DRESSINGS) ×8 IMPLANT
PAD ARMBOARD 7.5X6 YLW CONV (MISCELLANEOUS) ×6 IMPLANT
PENCIL SMOKE EVACUATOR (MISCELLANEOUS) ×1 IMPLANT
PIN SAFETY STERILE (MISCELLANEOUS) ×4 IMPLANT
PLASMABLADE 3.0S (MISCELLANEOUS)
SET ASEPTIC TRANSFER (MISCELLANEOUS) ×1 IMPLANT
SLEEVE SUCTION 125 (MISCELLANEOUS) ×1 IMPLANT
SLEEVE SUCTION CATH 165 (SLEEVE) ×1 IMPLANT
SLEEVE SURGEON STRL (DRAPES) ×1 IMPLANT
SPECIMEN JAR X LARGE (MISCELLANEOUS) ×3 IMPLANT
SUT ETHILON 3 0 FSL (SUTURE) ×3 IMPLANT
SUT MNCRL AB 4-0 PS2 18 (SUTURE) ×9 IMPLANT
SUT MON AB 3-0 SH 27 (SUTURE) ×2
SUT MON AB 3-0 SH27 (SUTURE) ×4 IMPLANT
SUT MON AB 5-0 PS2 18 (SUTURE) ×6 IMPLANT
SUT PDS AB 2-0 CT1 27 (SUTURE) ×15 IMPLANT
SUT SILK 3 0 SH 30 (SUTURE) ×1 IMPLANT
SUT SILK 4 0 PS 2 (SUTURE) ×2 IMPLANT
SUT VIC AB 0 CT1 27 (SUTURE)
SUT VIC AB 0 CT1 27XBRD ANBCTR (SUTURE) ×4 IMPLANT
SUT VIC AB 3-0 54X BRD REEL (SUTURE) IMPLANT
SUT VIC AB 3-0 BRD 54 (SUTURE)
SUT VIC AB 3-0 SH 18 (SUTURE) ×2 IMPLANT
SYR CONTROL 10ML LL (SYRINGE) IMPLANT
TOWEL OR 17X24 6PK STRL BLUE (TOWEL DISPOSABLE) ×6 IMPLANT
TOWEL OR 17X26 10 PK STRL BLUE (TOWEL DISPOSABLE) ×6 IMPLANT
TRAY FOLEY MTR SLVR 14FR STAT (SET/KITS/TRAYS/PACK) ×1 IMPLANT
TUBE CONNECTING 12X1/4 (SUCTIONS) ×1 IMPLANT

## 2018-08-14 NOTE — Anesthesia Procedure Notes (Signed)
Anesthesia Regional Block: Pectoralis block   Pre-Anesthetic Checklist: ,, timeout performed, Correct Patient, Correct Site, Correct Laterality, Correct Procedure, Correct Position, site marked, Risks and benefits discussed,  Surgical consent,  Pre-op evaluation,  At surgeon's request and post-op pain management  Laterality: Left  Prep: chloraprep       Needles:  Injection technique: Single-shot     Needle Length: 9cm  Needle Gauge: 22     Additional Needles: Arrow StimuQuik ECHO Echogenic Stimulating PNB Needle  Procedures:,,,, ultrasound used (permanent image in chart),,,,  Narrative:  Start time: 08/14/2018 7:46 AM End time: 08/14/2018 7:50 AM Injection made incrementally with aspirations every 5 mL.  Performed by: Personally  Anesthesiologist: Oleta Mouse, MD

## 2018-08-14 NOTE — Plan of Care (Signed)
  Problem: Education: Goal: Knowledge of General Education information will improve Description Including pain rating scale, medication(s)/side effects and non-pharmacologic comfort measures Outcome: Progressing   

## 2018-08-14 NOTE — Op Note (Addendum)
08/14/2018  10:18 AM  PATIENT:  Shannon May  47 y.o. female  PRE-OPERATIVE DIAGNOSIS:  LEFT BREAST DUCTAL CARCINOMA IN SITU, RIGHT BREAST PASH  POST-OPERATIVE DIAGNOSIS:  LEFT BREAST DUCTAL CARCINOMA IN SITU, RIGHT BREAST PASH  PROCEDURE:  Procedure(s): LEFT MASTECTOMY  WITH DEEP LEFT AXILLARY SENTINEL LYMPH NODE BIOPSY, RIGHT MASTECTOMY  SURGEON:  Surgeon(s) and Role: Panel 1:    * Jovita Kussmaul, MD - Primary Panel 2:    * Dillingham, Loel Lofty, DO - Primary  PHYSICIAN ASSISTANT:   ASSISTANTS: Judyann Munson, RNFA   ANESTHESIA:   general  EBL:  minimal   BLOOD ADMINISTERED:none  DRAINS: none   LOCAL MEDICATIONS USED:  NONE  SPECIMEN:  Source of Specimen:  left mastectomy and sentinel nodes X 3, right mastectomy  DISPOSITION OF SPECIMEN:  PATHOLOGY  COUNTS:  YES  TOURNIQUET:  * No tourniquets in log *  DICTATION: .Dragon Dictation   After informed consent was obtained the patient was brought to the operating room and placed in the supine position on the operating table.  After adequate induction of general anesthesia the patient's bilateral chest, breast, and axillary area were prepped with ChloraPrep, allowed to dry, and draped in usual sterile manner.  An appropriate timeout was performed.  Earlier in the day the patient underwent injection of 1 mCi of technetium sulfur colloid in the subareolar position on the left.  Attention was first turned to the right breast.  An elliptical incision was made around the nipple and areola complex in order to spare skin.  The incision was carried through the skin and subcutaneous tissue sharply with the plasma blade.  Breast hooks were used to elevate the skin flaps anteriorly towards the ceiling.  Thin skin flaps were then created by dissecting between the breast tissue and the subcutaneous fat.  This was done circumferentially all the way to the chest wall.  Next the breast was removed from the pectoralis muscle with the  pectoralis fascia.  Once the entire breast was removed it was oriented with a stitch on the lateral skin and sent to pathology for further evaluation.  Hemostasis was achieved using the plasma blade.  The wound was then packed with a moistened lap sponge.  Attention was then turned to the left breast.  A similar elliptical incision was made with a blade knife.  The incision was carried through the skin and subcutaneous tissue sharply with the plasma blade.  Breast hooks were used to elevate the skin flaps anteriorly towards the saline.  Thin skin flaps were then created by dissecting between the breast tissue and the subcutaneous fat.  This dissection was carried circumferentially all the way to the chest wall.  Next the breast was removed from the pectoralis muscle with the pectoralis fascia.  Once the breast was completely removed it was oriented with a stitch on the lateral skin and sent to pathology for further evaluation.  The neoprobe was then used to examine the left axilla.  There was a good signal.  I then dissected into the deep left axillary space under the direction of the neoprobe with the electrocautery.  I was able to identify 3 hot lymph nodes.  These were excised sharply with the electrocautery and the lymphatics and small vessels were controlled with clips.  Ex vivo counts on these nodes ranged from 300 to 3000.  These were sent as sentinel nodes numbers 1 through 3.  No other hot or palpable lymph nodes were identified in  the left axilla.  Hemostasis was achieved using the Bovie electrocautery.  The wound was then packed with a moistened lap sponge.  At this point the patient tolerated the procedure well.  The all needle sponge and instrument counts were correct.  The operation was turned over to Dr. Marla Roe for the reconstruction and her portion will be dictated separately.  PLAN OF CARE: Admit for overnight observation  PATIENT DISPOSITION:  PACU - hemodynamically stable.   Delay start  of Pharmacological VTE agent (>24hrs) due to surgical blood loss or risk of bleeding: no

## 2018-08-14 NOTE — Anesthesia Preprocedure Evaluation (Signed)
Anesthesia Evaluation  Patient identified by MRN, date of birth, ID band Patient awake    Reviewed: Allergy & Precautions, NPO status , Patient's Chart, lab work & pertinent test results  History of Anesthesia Complications Negative for: history of anesthetic complications  Airway Mallampati: I  TM Distance: >3 FB Neck ROM: Full    Dental  (+) Teeth Intact   Pulmonary former smoker,    breath sounds clear to auscultation       Cardiovascular hypertension, Pt. on medications (-) angina(-) Past MI and (-) CHF  Rhythm:Regular     Neuro/Psych  Headaches, Anxiety  Neuromuscular disease    GI/Hepatic negative GI ROS, Neg liver ROS,   Endo/Other  negative endocrine ROS  Renal/GU negative Renal ROS     Musculoskeletal   Abdominal   Peds  Hematology negative hematology ROS (+)   Anesthesia Other Findings Stress test neg 2017  Reproductive/Obstetrics                             Anesthesia Physical Anesthesia Plan  ASA: II  Anesthesia Plan: General and Regional   Post-op Pain Management:    Induction: Intravenous  PONV Risk Score and Plan: 3 and Ondansetron and Dexamethasone  Airway Management Planned: Oral ETT  Additional Equipment: None  Intra-op Plan:   Post-operative Plan: Extubation in OR  Informed Consent: I have reviewed the patients History and Physical, chart, labs and discussed the procedure including the risks, benefits and alternatives for the proposed anesthesia with the patient or authorized representative who has indicated his/her understanding and acceptance.   Dental advisory given  Plan Discussed with: CRNA and Surgeon  Anesthesia Plan Comments:         Anesthesia Quick Evaluation

## 2018-08-14 NOTE — Interval H&P Note (Signed)
History and Physical Interval Note:  08/14/2018 7:36 AM  Shannon May  has presented today for surgery, with the diagnosis of LEFT BREAST DUCTAL CARCINOMA IN SITU  The various methods of treatment have been discussed with the patient and family. After consideration of risks, benefits and other options for treatment, the patient has consented to  Procedure(s): BILATERAL MASTECTOMIES  WITH LEFT SENTINEL LYMPH NODE MAPPING (Bilateral) BREAST RECONSTRUCTION WITH PLACEMENT OF TISSUE EXPANDER AND FLEXHD (N/A) as a surgical intervention .  The patient's history has been reviewed, patient examined, no change in status, stable for surgery.  I have reviewed the patient's chart and labs.  Questions were answered to the patient's satisfaction.     Loel Lofty Dillingham

## 2018-08-14 NOTE — Op Note (Addendum)
Op report    DATE OF OPERATION:  08/14/2018  LOCATION: [facility]   SURGICAL DIVISION: Plastic Surgery  PREOPERATIVE DIAGNOSES:  1. Left Breast cancer.    POSTOPERATIVE DIAGNOSES:  1. Left Breast cancer.   PROCEDURE:  1. Bilateral immediate breast reconstruction with placement of Acellular Dermal Matrix and tissue expanders.  SURGEON: Biviana Saddler Sanger Bonney Berres, DO  ASSISTANT: Ronette Deter, PA  ANESTHESIA:  General.   COMPLICATIONS: None.   IMPLANTS: Left - Mentor 535 cc. Ref #[id number].  Serial Number [id number], 150 cc of injectable saline placed in the expander. Right - Mentor 535 cc. Ref #[id number].  Serial Number [id number], 150 cc of injectable saline placed in the expander. Acellular Dermal Matrix (FlexHD) 4 x 16 cm two  INDICATIONS FOR PROCEDURE:  The patient, Shannon May, is a 47 y.o. female born on [birth year], is here for  immediate first stage breast reconstruction with placement of bilateral tissue expander and Acellular dermal matrix. MRN: [id number]  CONSENT:  Informed consent was obtained directly from the patient. Risks, benefits and alternatives were fully discussed. Specific risks including but not limited to bleeding, infection, hematoma, seroma, scarring, pain, implant infection, implant extrusion, capsular contracture, asymmetry, wound healing problems, and need for further surgery were all discussed. The patient did have an ample opportunity to have her questions answered to her satisfaction.   DESCRIPTION OF PROCEDURE:  The patient was taken to the operating room by the general surgery team. SCDs were placed and IV antibiotics were given. The patient's chest was prepped and draped in a sterile fashion. A time out was performed and the implants to be used were identified.  bilateral mastectomies were performed.  Once the general surgery team had completed their portion of the case the patient was rendered to the plastic and  reconstructive surgery team.  Right:  The pectoralis major muscle was lifted from the chest wall with release of the lateral edge and lateral inframammary fold.  The pocket was irrigated with antibiotic solution and hemostasis was achieved with electrocautery.  The ADM was then prepared according to the manufacture guidelines and slits placed to help with postoperative fluid management.  The ADM was then sutured to the inferior and lateral edge of the inframammary fold with 2-0 PDS starting with an interrupted stitch and then a running stitch.  The lateral portion was sutured to with interrupted sutures after the expander was placed.  The expander was prepared according to the manufacture guidelines, the air evacuated and then it was placed under the ADM and pectoralis major muscle.  The inferior and lateral tabs were used to secure the expander to the chest wall with 2-0 PDS.  The drain was placed at the inframammary fold over the ADM and secured to the skin with 3-0 Silk.  The deep layers were closed with 3-0 Monocryl followed by 4-0 Monocryl.  The skin was closed with 5-0 Monocryl and then dermabond was applied.    Right:  The pectoralis major muscle was lifted from the chest wall with release of the lateral edge and lateral inframammary fold.  The pocket was irrigated with antibiotic solution and hemostasis was achieved with electrocautery.  The ADM was then prepared according to the manufacture guidelines and slits placed to help with postoperative fluid management.  The ADM was then sutured to the inferior and lateral edge of the inframammary fold with 2-0 PDS starting with an interrupted stitch and then a running stitch.  The lateral portion was sutured to with  interrupted sutures after the expander was placed.  The expander was prepared according to the manufacture guidelines, the air evacuated and then it was placed under the ADM and pectoralis major muscle.  The inferior and lateral tabs were used to  secure the expander to the chest wall with 2-0 PDS.  The drain was placed at the inframammary fold over the ADM and secured to the skin with 3-0 Silk.    The deep layers were closed with 3-0 Monocryl followed by 4-0 Monocryl.  The skin was closed with 5-0 Monocryl and then dermabond was applied.  The advanced practice practitioner (APP) assisted throughout the case.  The APP was essential in retraction and counter traction when needed to make the case progress smoothly.  This retraction and assistance made it possible to see the tissue plans for the procedure and place the expander under the muscle.  The assistance was needed for blood control, tissue re-approximation and assisted with closure of the incision site. The ABDs and breast binder were placed.  The patient tolerated the procedure well and there were no complications.  The patient was allowed to wake from anesthesia and taken to the recovery room in satisfactory condition.

## 2018-08-14 NOTE — Plan of Care (Signed)
  Problem: Activity: Goal: Risk for activity intolerance will decrease Outcome: Progressing   Problem: Nutrition: Goal: Adequate nutrition will be maintained Outcome: Progressing   Problem: Coping: Goal: Level of anxiety will decrease Outcome: Progressing   Problem: Pain Managment: Goal: General experience of comfort will improve Outcome: Progressing   Problem: Skin Integrity: Goal: Risk for impaired skin integrity will decrease Outcome: Progressing   Problem: Activity: Goal: Ability to maintain or regain function will improve Outcome: Progressing   Problem: Clinical Measurements: Goal: Postoperative complications will be avoided or minimized Outcome: Progressing   Problem: Self-Concept: Goal: Ability to verbalize positive feelings about self will improve Outcome: Progressing   Problem: Skin Integrity: Goal: Demonstration of wound healing without infection will improve Outcome: Progressing

## 2018-08-14 NOTE — Anesthesia Procedure Notes (Signed)
Procedure Name: Intubation Date/Time: 08/14/2018 8:17 AM Performed by: Alain Marion, CRNA Pre-anesthesia Checklist: Patient identified, Emergency Drugs available, Suction available and Patient being monitored Patient Re-evaluated:Patient Re-evaluated prior to induction Oxygen Delivery Method: Circle System Utilized Preoxygenation: Pre-oxygenation with 100% oxygen Induction Type: IV induction Ventilation: Mask ventilation without difficulty Laryngoscope Size: Miller and 2 Grade View: Grade I Tube type: Oral Tube size: 7.0 mm Number of attempts: 1 Airway Equipment and Method: Stylet and Oral airway Placement Confirmation: ETT inserted through vocal cords under direct vision,  positive ETCO2 and breath sounds checked- equal and bilateral Secured at: 22 cm Tube secured with: Tape Dental Injury: Teeth and Oropharynx as per pre-operative assessment

## 2018-08-14 NOTE — Anesthesia Procedure Notes (Addendum)
Anesthesia Regional Block: Pectoralis block   Pre-Anesthetic Checklist: ,, timeout performed, Correct Patient, Correct Site, Correct Laterality, Correct Procedure, Correct Position, site marked, Risks and benefits discussed,  Surgical consent,  Pre-op evaluation,  At surgeon's request and post-op pain management  Laterality: Right  Prep: chloraprep       Needles:  Injection technique: Single-shot     Needle Length: 9cm  Needle Gauge: 22     Additional Needles: Arrow StimuQuik ECHO Echogenic Stimulating PNB Needle  Procedures:,,,, ultrasound used (permanent image in chart),,,,  Narrative:  Start time: 08/14/2018 7:50 AM End time: 08/14/2018 7:56 AM Injection made incrementally with aspirations every 5 mL.  Performed by: Personally  Anesthesiologist: Oleta Mouse, MD

## 2018-08-14 NOTE — Interval H&P Note (Signed)
History and Physical Interval Note:  08/14/2018 7:58 AM  Shannon May  has presented today for surgery, with the diagnosis of LEFT BREAST DUCTAL CARCINOMA IN SITU  The various methods of treatment have been discussed with the patient and family. After consideration of risks, benefits and other options for treatment, the patient has consented to  Procedure(s): BILATERAL MASTECTOMIES  WITH LEFT SENTINEL LYMPH NODE MAPPING (Bilateral) BREAST RECONSTRUCTION WITH PLACEMENT OF TISSUE EXPANDER AND FLEXHD (N/A) as a surgical intervention .  The patient's history has been reviewed, patient examined, no change in status, stable for surgery.  I have reviewed the patient's chart and labs.  Questions were answered to the patient's satisfaction.     Autumn Messing III

## 2018-08-14 NOTE — H&P (Signed)
Shannon May  Location: [facility] Surgery Patient #: [id number] DOB: May 09, 1971 Married / Language: English / Race: White Female   History of Present Illness The patient is a 47 year old female who presents for a follow-up for Breast cancer. 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 She apparently had some of her workup done at the cancer treatment centers of [place] including her genetics which are not back yet. Apparently she would like to keep her care here locally. She is strongly considering bilateral mastectomies and left sentinel lymph node mapping.   Allergies  Histamine Phosphate *CHEMICALS*  Sulfa Antibiotics  Allergies Reconciled   Medication History  hydroCHLOROthiazide (12.5MG  Capsule, Oral) Active. Multi-Vitamin (Oral) Active. Loratadine (10MG  Tablet, Oral) Active. Probiotic (Oral) Active. Flonase (50MCG/DOSE Inhaler, Nasal) Active. Medications Reconciled    Review of Systems General Present- Fatigue. Not Present- Appetite Loss, Chills, Fever, Night Sweats, Weight Gain and Weight Loss. Skin Not Present- Change in Wart/Mole, Dryness, Hives, Jaundice, New Lesions, Non-Healing Wounds, Rash and Ulcer. HEENT Present- Seasonal Allergies. Not Present- Earache, Hearing Loss, Hoarseness, Nose Bleed, Oral Ulcers, Ringing in the Ears, Sinus Pain, Sore Throat, Visual Disturbances, Wears glasses/contact lenses and Yellow Eyes. Respiratory Not Present- Bloody sputum, Chronic Cough, Difficulty Breathing, Snoring and Wheezing. Breast Not Present- Breast Mass, Breast Pain, Nipple Discharge and Skin Changes. Cardiovascular Present- Swelling of Extremities. Not Present- Chest Pain, Difficulty Breathing Lying Down, Leg Cramps,  Palpitations, Rapid Heart Rate and Shortness of Breath. Gastrointestinal Not Present- Abdominal Pain, Bloating, Bloody Stool, Change in Bowel Habits, Chronic diarrhea, Constipation, Difficulty Swallowing, Excessive gas, Gets full quickly at meals, Hemorrhoids, Indigestion, Nausea, Rectal Pain and Vomiting. Female Genitourinary Not Present- Frequency, Nocturia, Painful Urination, Pelvic Pain and Urgency. Musculoskeletal Present- Joint Stiffness. Not Present- Back Pain, Joint Pain, Muscle Pain, Muscle Weakness and Swelling of Extremities. Neurological Not Present- Decreased Memory, Fainting, Headaches, Numbness, Seizures, Tingling, Tremor, Trouble walking and Weakness. Psychiatric Not Present- Anxiety, Bipolar, Change in Sleep Pattern, Depression, Fearful and Frequent crying. Endocrine Not Present- Cold Intolerance, Excessive Hunger, Hair Changes, Heat Intolerance, Hot flashes and New Diabetes. Hematology Not Present- Blood Thinners, Easy Bruising, Excessive bleeding, Gland problems, HIV and Persistent Infections.  Vitals Weight: 209.4 lb Height: 67.5in Body Surface Area: 2.07 m Body Mass Index: 32.31 kg/m  Temp.: 97.10F(Temporal)  Pulse: 81 (Regular)  P.OX: 99% (Room air) BP: 124/78 (Sitting, Left Arm, Standard)       Physical Exam  General Mental Status-Alert. General Appearance-Consistent with stated age. Hydration-Well hydrated. Voice-Normal.  Head and Neck Head-normocephalic, atraumatic with no lesions or palpable masses. Trachea-midline. Thyroid Gland Characteristics - normal size and consistency.  Eye Eyeball - Bilateral-Extraocular movements intact. Sclera/Conjunctiva - Bilateral-No scleral icterus.  Chest and Lung Exam Chest and lung exam reveals -quiet, even and easy respiratory effort with no use of accessory muscles and on auscultation, normal breath sounds, no adventitious sounds and normal vocal resonance. Inspection Chest Wall -  Normal. Back - normal.  Breast Note: There is no palpable mass in either breast. There is no palpable axillary, supraclavicular, or cervical lymphadenopathy   Cardiovascular Cardiovascular examination reveals -normal heart sounds, regular rate and rhythm with no murmurs and normal pedal pulses bilaterally.  Abdomen Inspection Inspection of the abdomen  reveals - No Hernias. Skin - Scar - no surgical scars. Palpation/Percussion Palpation and Percussion of the abdomen reveal - Soft, Non Tender, No Rebound tenderness, No Rigidity (guarding) and No hepatosplenomegaly. Auscultation Auscultation of the abdomen reveals - Bowel sounds normal.  Neurologic Neurologic evaluation reveals -alert and oriented x 3 with no impairment of recent or remote memory. Mental Status-Normal.  Musculoskeletal Normal Exam - Left-Upper Extremity Strength Normal and Lower Extremity Strength Normal. Normal Exam - Right-Upper Extremity Strength Normal and Lower Extremity Strength Normal.  Lymphatic Head & Neck  General Head & Neck Lymphatics: Bilateral - Description - Normal. Axillary  General Axillary Region: Bilateral - Description - Normal. Tenderness - Non Tender. Femoral & Inguinal  Generalized Femoral & Inguinal Lymphatics: Bilateral - Description - Normal. Tenderness - Non Tender.    Assessment & Plan DUCTAL CARCINOMA IN SITU (DCIS) OF LEFT BREAST (D05.12) Impression: The patient appears to have a large area of DCIS in the upper outer left breast. Because of the size of the area involved she favors a mastectomy on the left side which I believe is the right choice for her. She is also a good candidate for sentinel node mapping. 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 Because of the size and appearance of this she will need to also have this area removed. At this point she favors mastectomy on the right side as well. She is also a  candidate for reconstruction and we will work with the plastic surgeons on arranging for this. We will go ahead and begin surgical planning. Her genetic testing will be back in the next couple days and she will let us know what that shows.

## 2018-08-14 NOTE — Transfer of Care (Signed)
Immediate Anesthesia Transfer of Care Note  Patient: Shannon May  Procedure(s) Performed: BILATERAL MASTECTOMIES  WITH LEFT SENTINEL LYMPH NODE MAPPING (Bilateral Breast) BREAST RECONSTRUCTION WITH PLACEMENT OF TISSUE EXPANDER AND FLEXHD (N/A )  Patient Location: PACU  Anesthesia Type:GA combined with regional for post-op pain  Level of Consciousness: awake, alert  and oriented  Airway & Oxygen Therapy: Patient Spontanous Breathing and Patient connected to nasal cannula oxygen  Post-op Assessment: Report given to RN and Post -op Vital signs reviewed and stable  Post vital signs: Reviewed and stable  Last Vitals:  Vitals Value Taken Time  BP 105/65 08/14/2018 11:44 AM  Temp    Pulse 68 08/14/2018 11:46 AM  Resp 19 08/14/2018 11:46 AM  SpO2 99 % 08/14/2018 11:46 AM  Vitals shown include unvalidated device data.  Last Pain:  Vitals:   08/14/18 0711  PainSc: 0-No pain         Complications: No apparent anesthesia complications

## 2018-08-15 ENCOUNTER — Encounter (HOSPITAL_COMMUNITY): Payer: Self-pay | Admitting: General Surgery

## 2018-08-15 DIAGNOSIS — D0512 Intraductal carcinoma in situ of left breast: Secondary | ICD-10-CM | POA: Diagnosis not present

## 2018-08-15 LAB — HIV ANTIBODY (ROUTINE TESTING W REFLEX): HIV Screen 4th Generation wRfx: NONREACTIVE

## 2018-08-15 NOTE — Progress Notes (Signed)
[name] to be D/C'd  per MD order. Discussed with the patient and all questions fully answered.  VSS, Skin clean, dry and intact without evidence of skin break down, no evidence of skin tears noted.  IV catheter discontinued intact. Site without signs and symptoms of complications. Dressing and pressure applied.  An After Visit Summary was printed and given to the patient. Patient received prescription.  D/c education completed with patient/family including follow up instructions, medication list, d/c activities limitations if indicated, with other d/c instructions as indicated by MD - patient able to verbalize understanding, all questions fully answered.   Patient instructed to return to ED, call 911, or call MD for any changes in condition.   Patient to be escorted via [place], and D/C home via private auto.

## 2018-08-15 NOTE — Progress Notes (Signed)
1 Day Post-Op   Subjective/Chief Complaint: Complains of soreness. Otherwise ok   Objective: Vital signs in last 24 hours: Temp:  [97.5 F (36.4 C)-98.5 F (36.9 C)] 98.5 F (36.9 C) (12/10 0614) Pulse Rate:  [54-66] 58 (12/10 0614) Resp:  [12-21] 16 (12/10 0614) BP: (90-121)/(50-76) 94/61 (12/10 0614) SpO2:  [93 %-100 %] 97 % (12/10 0614) Last BM Date: 08/12/18  Intake/Output from previous day: 12/09 0701 - 12/10 0700 In: 3225.2 [P.O.:300; I.V.:2725.2; IV Piggyback:200] Out: 300 [Urine:100; Drains:175; Blood:25] Intake/Output this shift: Total I/O In: 240 [P.O.:240] Out: -   General appearance: alert and cooperative Resp: clear to auscultation bilaterally Chest wall: skin flaps look good Cardio: regular rate and rhythm GI: soft, non-tender; bowel sounds normal; no masses,  no organomegaly  Lab Results:  No results for input(s): WBC, HGB, HCT, PLT in the last 72 hours. BMET No results for input(s): NA, K, CL, CO2, GLUCOSE, BUN, CREATININE, CALCIUM in the last 72 hours. PT/INR No results for input(s): LABPROT, INR in the last 72 hours. ABG No results for input(s): PHART, HCO3 in the last 72 hours.  Invalid input(s): PCO2, PO2  Studies/Results: Nm Sentinel Node Inj-no Rpt (breast)  Result Date: 08/14/2018 Sulfur colloid was injected by the nuclear medicine technologist for melanoma sentinel node.    Anti-infectives: Anti-infectives (From admission, onward)   Start     Dose/Rate Route Frequency Ordered Stop   08/14/18 1400  ceFAZolin (ANCEF) IVPB 2g/100 mL premix     2 g 200 mL/hr over 30 Minutes Intravenous Every 8 hours 08/14/18 1143     08/14/18 0751  polymyxin B 500,000 Units, bacitracin 50,000 Units in sodium chloride 0.9 % 500 mL irrigation  Status:  Discontinued       As needed 08/14/18 0751 08/14/18 1138   08/14/18 0645  ceFAZolin (ANCEF) IVPB 2g/100 mL premix  Status:  Discontinued     2 g 200 mL/hr over 30 Minutes Intravenous On call to O.R. 08/14/18  0631 08/14/18 [id number]   08/14/18 0630  ceFAZolin (ANCEF) IVPB 2g/100 mL premix     2 g 200 mL/hr over 30 Minutes Intravenous On call to O.R. 08/14/18 0629 08/14/18 0852      Assessment/Plan: s/p Procedure(s): BILATERAL MASTECTOMIES  WITH LEFT SENTINEL LYMPH NODE MAPPING (Bilateral) BREAST RECONSTRUCTION WITH PLACEMENT OF TISSUE EXPANDER AND FLEXHD (N/A) Advance diet  Plan for discharge today Teach pt drain care  LOS: 0 days    Shannon May 08/15/2018

## 2018-08-15 NOTE — Discharge Summary (Signed)
Physician Discharge Summary  Patient ID: Shannon May MRN: [id number] DOB/AGE: 1971/08/28 47 y.o.  Admit date: 08/14/2018 Discharge date: 08/15/2018  Admission Diagnoses:  Discharge Diagnoses:  Active Problems:   Breast cancer [facility])   Discharged Condition: good  Hospital Course: The patient was taken to the OR and underwent bilateral mastectomies with immediate reconstruction.  She was managed in the surgical unit postoperatively.  Her pain was well controlled.  She was voiding, tolerating food, pain well tolerated and ready to go home.  She was given instructions on drain care for her and her husband.  Consults: None  Significant Diagnostic Studies: none  Treatments: surgery  Discharge Exam: Blood pressure 94/61, pulse (!) 58, temperature 98.5 F (36.9 C), temperature source Oral, resp. rate 16, height 5' 7.5" (1.715 m), weight 89.8 kg, last menstrual period 07/15/2018, SpO2 97 %. General appearance: alert, cooperative and no distress Breasts: drains working well.  Disposition:     Follow-up Information    Dillingham, Loel Lofty, DO In 1 week.   Specialty:  Plastic Surgery Contact information: [address] [place] [address] [phone]        Autumn Messing III, MD In 2 weeks.   Specialty:  General Surgery Contact information: [address] [place] [address] [phone]           Signed: Wallace Going 08/15/2018, 8:41 AM

## 2018-08-15 NOTE — Progress Notes (Addendum)
Subjective: Very pleasant 47 year old white female pt is s/p day one after b/l mastectomy and expander placement.  Pt reports pain level at 4/10.  She is overall doing well.  She denies nausea this am.  She reports eating well since surgery.  All questions were elicited and answered.  Husband is at the pts bedside.   Objective: Vital signs in last 24 hours: Temp:  [97.5 F (36.4 C)-98.5 F (36.9 C)] 98.5 F (36.9 C) (12/10 0614) Pulse Rate:  [54-66] 58 (12/10 0614) Resp:  [12-21] 16 (12/10 0614) BP: (90-121)/(50-76) 94/61 (12/10 0614) SpO2:  [93 %-100 %] 97 % (12/10 1155) Weight change:  Last BM Date: 08/12/18  Intake/Output from previous day: 12/09 0701 - 12/10 0700 In: 3225.2 [P.O.:300; I.V.:2725.2; IV Piggyback:200] Out: 300 [Urine:100; Drains:175; Blood:25] Intake/Output this shift: Total I/O In: 240 [P.O.:240] Out: -   Pt is alert and oriented x 3 Incision sites look good, no discharge.  Drains in place  Lab Results: No results for input(s): WBC, HGB, HCT, PLT in the last 72 hours. BMET No results for input(s): NA, K, CL, CO2, GLUCOSE, BUN, CREATININE, CALCIUM in the last 72 hours.  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    Medications: I have reviewed the patient's current medications.  Assessment/Plan: Pt will DC today Pt will present to clinic in 1 week All post op meds have already been sent to pharmacy and pt has at home Pt understands to contact our clinic with any questions or concerns  LOS: 0 days    Melida Gimenez 08/15/2018

## 2018-08-15 NOTE — Discharge Instructions (Signed)
INSTRUCTIONS FOR AFTER BREAST SURGERY ° ° °You are getting ready to undergo breast surgery.  You will likely have some questions about what to expect following your operation.  The following information will help you and your family understand what to expect when you are discharged from the hospital.  Following these guidelines will help ensure a smooth recovery and reduce risks of complications.   °Postoperative instructions include information on: diet, wound care, medications and physical activity. ° °AFTER SURGERY °Expect to go home after the procedure.  In some cases, you may need to spend one night in the hospital for observation. ° °DIET °Breast surgery does not require a specific diet.  However, I have to mention that the healthier you eat the better your body can start healing. It is important to increasing your protein intake.  This means limiting the foods with sugar and carbohydrates.  Focus on vegetables and some meat.  If you have any liposuction during your procedure be sure to drink water.  If your urine is bright yellow, then it is concentrated, and you need to drink more water.  As a general rule after surgery, you should have 8 ounces of water every hour while awake.  If you find you are persistently nauseated or unable to take in liquids let us know.  NO TOBACCO USE or EXPOSURE.  This will slow your healing process and increase the risk of a wound. ° °WOUND CARE °You can shower the day after surgery if you don't have a drain.  Use fragrance free soap.  Dial, Dove and Ivory are usually mild on the skin. If you have a drain clean with baby wipes until the drain is removed.  If you have steri-strips / tape directly attached to your skin leave them in place. It is OK to get these wet.  No baths, pools or hot tubs for two weeks. °We close your incision to leave the smallest and best-looking scar. No ointment or creams on your incisions until given the go ahead.  Especially not Neosporin (Too many skin  reactions with this one).  A few weeks after surgery you can use Mederma and start massaging the scar. °We ask you to wear your binder or sports bra for the first 6 weeks around the clock, including while sleeping. This provides added comfort and helps reduce the fluid accumulation at the surgery site. ° °ACTIVITY °No heavy lifting until cleared by the doctor.  This usually means no more than a half-gallon of milk.  It is OK to walk and climb stairs. In fact, moving your legs is very important to decrease your risk of a blood clot.  It will also help keep you from getting deconditioned.  Every 1 to 2 hours get up and walk for 5 minutes. This will help with a quicker recovery back to normal.  Let pain be your guide so you don't do too much.  NO, you cannot do the spring cleaning and don't plan on taking care of anyone else.  This is your time for TLC.  °You will be more comfortable if you sleep and rest with your head elevated either with a few pillows under you or in a recliner.  No stomach sleeping for a few months. ° °WORK °Everyone returns to work at different times. As a rough guide, most people take at least 1 - 2 weeks off prior to returning to work. If you need documentation for your job, bring the forms to your postoperative follow   up visit. ° °DRIVING °Arrange for someone to bring you home from the hospital.  You may be able to drive a few days after surgery but not while taking any narcotics or valium. ° °BOWEL MOVEMENTS °Constipation can occur after anesthesia and while taking pain medication.  It is important to stay ahead for your comfort.  We recommend taking Milk of Magnesia (2 tablespoons; twice a day) while taking the pain pills. ° °SEROMA °This is fluid your body tried to put in the surgical site.  This is normal but if it creates tight skinny skin let us know.  It usually decreases in a few weeks. ° °WHEN TO CALL °Call your surgeon's office if any of the following occur: °• Fever 101 degrees F or  greater °• Excessive bleeding or fluid from the incision site. °• Pain that increases over time without aid from the medications °• Redness, warmth, or pus draining from incision sites °• Persistent nausea or inability to take in liquids °• Severe misshapen area that underwent the operation. ° °Here are some resources: ° °1. Plastic surgery website: https://www.plasticsurgery.org/for-medical-professionals/education-and-resources/publications/breast-reconstruction-magazine °2. Breast Reconstruction Awareness Campaign:  http://www.breastreconusa.org/ °3. Plastic surgery Implant information:  https://www.plasticsurgery.org/patient-safety/breast-implant-safety ° °

## 2018-08-16 ENCOUNTER — Emergency Department (HOSPITAL_COMMUNITY)
Admission: EM | Admit: 2018-08-16 | Discharge: 2018-08-16 | Disposition: A | Discharge status: Home / Self Care | Payer: BC Managed Care – PPO | Attending: Emergency Medicine | Admitting: Emergency Medicine

## 2018-08-16 ENCOUNTER — Encounter (HOSPITAL_COMMUNITY): Payer: Self-pay | Admitting: Emergency Medicine

## 2018-08-16 ENCOUNTER — Other Ambulatory Visit: Payer: Self-pay

## 2018-08-16 DIAGNOSIS — C50412 Malignant neoplasm of upper-outer quadrant of left female breast: Secondary | ICD-10-CM | POA: Diagnosis not present

## 2018-08-16 DIAGNOSIS — Z87891 Personal history of nicotine dependence: Secondary | ICD-10-CM | POA: Insufficient documentation

## 2018-08-16 DIAGNOSIS — I10 Essential (primary) hypertension: Secondary | ICD-10-CM | POA: Insufficient documentation

## 2018-08-16 DIAGNOSIS — K5900 Constipation, unspecified: Secondary | ICD-10-CM

## 2018-08-16 DIAGNOSIS — Z79899 Other long term (current) drug therapy: Secondary | ICD-10-CM | POA: Insufficient documentation

## 2018-08-16 NOTE — ED Provider Notes (Signed)
[facility] EMERGENCY DEPARTMENT Provider Note   CSN: [id number] Arrival date & time: 08/16/18  0459     History   Chief Complaint Chief Complaint  Patient presents with  . Post-op Problem  . Constipation    HPI Shannon May is a 47 y.o. female.  Patient to ED with complaint of constipation and abdominal pain. No bowel movement in 5 days. She had recent surgery (bilateral mastectomy) and was told symptoms to watch out for. She has been using mag citrate, prune juice, Miralax and self-disimpaction at home without relief. No fever, nausea or vomiting. She denies any concerning symptoms with regard to the surgical sites.  The history is provided by the patient. No language interpreter was used.  Constipation   Associated symptoms include abdominal pain.    Past Medical History:  Diagnosis Date  . Anxiety   . Diverticulosis   . Hypertension 11/08/2017  . Malignant neoplasm of upper-outer quadrant of left breast in female, estrogen receptor positive ([place]) 06/08/2018  . Sinusitis 11/15/2017    Patient Active Problem List   Diagnosis Date Noted  . Breast cancer ([place]) 08/14/2018  . 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 Symptomatic mammary hypertrophy 06/08/2018  . Malignant neoplasm of upper-outer quadrant of left breast in female, estrogen receptor positive ([place]) 06/08/2018  . Sinusitis 11/15/2017  . Nodule of ear canal, bilateral 11/15/2017  . Hypertension 11/08/2017  . Venous stasis 05/13/2016  . Plantar fasciitis 05/23/2014  . Lumbar radiculitis 05/23/2014  . Verruca pedis 11/30/2012  . Left subacromial bursitis 11/30/2012  . DERMATITIS, 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/06/2010  . HEADACHE 04/06/2010  . HEEL PAIN, RIGHT 03/12/2009  . ALLERGIC RHINITIS 05/09/2008    Past Surgical History:  Procedure Laterality Date  . BREAST RECONSTRUCTION WITH PLACEMENT OF TISSUE EXPANDER AND ALLODERM N/A 08/14/2018   Procedure: BREAST RECONSTRUCTION WITH  PLACEMENT OF TISSUE EXPANDER AND FLEXHD;  Surgeon: Wallace Going, DO;  Location: [facility];  Service: Plastics;  Laterality: N/A;  . BREAST SURGERY    . DILATION AND CURETTAGE OF UTERUS    . LASIK    . MASTECTOMY W/ SENTINEL NODE BIOPSY Bilateral 08/14/2018   with breast reconstruction  . MASTECTOMY W/ SENTINEL NODE BIOPSY Bilateral 08/14/2018   Procedure: BILATERAL MASTECTOMIES  WITH LEFT SENTINEL LYMPH NODE MAPPING;  Surgeon: Jovita Kussmaul, MD;  Location: [facility];  Service: General;  Laterality: Bilateral;     OB History    Gravida  5   Para  3   Term      Preterm      AB  2   Living  3     SAB  1   TAB  1   Ectopic      Multiple      Live Births               Home Medications    Prior to Admission medications   Medication Sig Start Date End Date Taking? Authorizing Provider  ALPRAZolam Duanne Moron) 0.5 MG tablet Take 1-2 tablets (0.5-1 mg total) by mouth daily as needed for anxiety. 06/12/18   Hali Marry, MD  diazepam (VALIUM) 2 MG tablet Take 1 tablet (2 mg total) by mouth every 8 (eight) hours as needed for up to 10 days for anxiety or muscle spasms. 08/08/18 08/18/18  Dillingham, Loel Lofty, DO  Docusate Calcium (STOOL SOFTENER PO) Take 1 tablet by mouth 2 (two) times daily.     [provider]  fluticasone (FLONASE) 50 MCG/ACT nasal spray PLACE 2 SPRAYS INTO THE NOSE DAILY Patient taking differently: Place 2 sprays into both nostrils daily.  04/24/14   Breeback, Jade L, PA-C  hydrochlorothiazide (MICROZIDE) 12.5 MG capsule TAKE 1 CAPSULE BY MOUTH EVERY DAY Patient taking differently: Take 12.5 mg by mouth daily.  07/03/18   Hali Marry, MD  loratadine (CLARITIN) 10 MG tablet Take 10 mg by mouth daily.    [provider]  Multiple Vitamins-Minerals (MULTIVITAMIN WOMEN PO) Take 1 capsule by mouth daily.    [provider]  Probiotic Product (PRO-BIOTIC BLEND PO) Take 1 tablet by mouth daily.     [provider]    tamoxifen (NOLVADEX) 20 MG tablet Take 1 tablet (20 mg total) by mouth daily. 06/13/18   Nicholas Lose, MD    Family History Family History  Problem Relation Age of Onset  . Colon cancer Maternal Grandfather   . Melanoma Paternal Grandfather   . Heart attack Maternal Grandmother   . Hypertension Mother   . Hyperlipidemia Father   . Vaginal cancer Maternal Aunt     Social History Social History   Tobacco Use  . Smoking status: Former Smoker    Types: Cigarettes    Last attempt to quit: 06/13/1988    Years since quitting: 30.1  . Smokeless tobacco: Never Used  . Tobacco comment: She smoked a couple of months in her teens.   Substance Use Topics  . Alcohol use: Yes    Alcohol/week: 3.0 standard drinks    Types: 3 Glasses of wine per week  . Drug use: No     Allergies   Antihistamines, chlorpheniramine-type; Sudafed [pseudoephedrine hcl]; Sulfamethoxazole; and Erythromycin   Review of Systems Review of Systems  Constitutional: Negative for fever.  Respiratory: Negative for shortness of breath.   Cardiovascular: Negative for chest pain.  Gastrointestinal: Positive for abdominal pain and constipation. Negative for blood in stool, nausea and vomiting.  Genitourinary: Negative.   Musculoskeletal: Negative.      Physical Exam Updated Vital Signs BP (!) 140/92 (BP Location: Right Arm)   Pulse 83   Temp 98 F (36.7 C) (Oral)   Resp 17   Ht 5' 7.5" (1.715 m)   Wt 90.7 kg   SpO2 99%   BMI 30.86 kg/m   Physical Exam  Constitutional: She is oriented to person, place, and time. She appears well-developed and well-nourished.  Neck: Normal range of motion.  Pulmonary/Chest: Effort normal.  Abdominal: She exhibits no distension. There is no tenderness.  Neurological: She is alert and oriented to person, place, and time.  Skin: Skin is warm and dry.     ED Treatments / Results  Labs (all labs ordered are listed, but only abnormal results are displayed) Labs  Reviewed - No data to display  EKG None  Radiology Nm Sentinel Node Inj-no Rpt (breast)  Result Date: 08/14/2018 Sulfur colloid was injected by the nuclear medicine technologist for melanoma sentinel node.    Procedures Procedures (including critical care time)  Medications Ordered in ED Medications - No data to display   Initial Impression / Assessment and Plan / ED Course  I have reviewed the triage vital signs and the nursing notes.  Pertinent labs & imaging results that were available during my care of the patient were reviewed by me and considered in my medical decision making (see chart for details).     Patient to ED for help with post-operative constipation and  abdominal pain. No vomiting, fever. She has had no bowel movement in 5 days despite multiple cathartics, water, walking.   While waiting to be seen the patient went to the bathroom and had a large stool with immediate relief of her abdominal pain. Abdominal exam is benign. She wishes to be discharged from the ED. Discussed return precautions and close follow up as scheduled with surgeon. Patient acknowledges understanding.    Final Clinical Impressions(s) / ED Diagnoses   Final diagnoses:  Constipation, unspecified constipation type    ED Discharge Orders    None       Charlann Lange, PA-C 08/16/18 0569    Merryl Hacker, MD 08/20/18 2258

## 2018-08-16 NOTE — ED Notes (Signed)
Pt verbalizes understanding of d/c instructions. Pt taken to lobby in wheelchair at d/c with all belongings and with family.   

## 2018-08-16 NOTE — ED Notes (Signed)
ED Provider at bedside. 

## 2018-08-16 NOTE — ED Notes (Signed)
Pt reports going to the bathroom and having a BM. Pt reports feeling a huge relief and wants to go home. PA Shari aware.

## 2018-08-16 NOTE — ED Triage Notes (Addendum)
Pt reports double mastectomy with lymph nodes removed from left arm done on Monday. Pt reports last BM  Saturday, pt was discharged from hospital today. Pain 10/10. Denies N/V. Pt has attempted multiple medications, increased water intake and walking with no relief.

## 2018-08-16 NOTE — Anesthesia Postprocedure Evaluation (Signed)
Anesthesia Post Note  Patient: Shannon May  Procedure(s) Performed: BILATERAL MASTECTOMIES  WITH LEFT SENTINEL LYMPH NODE MAPPING (Bilateral Breast) BREAST RECONSTRUCTION WITH PLACEMENT OF TISSUE EXPANDER AND FLEXHD (N/A )     Patient location during evaluation: PACU Anesthesia Type: Regional and General Level of consciousness: awake and alert Pain management: pain level controlled Vital Signs Assessment: post-procedure vital signs reviewed and stable Respiratory status: spontaneous breathing, nonlabored ventilation, respiratory function stable and patient connected to nasal cannula oxygen Cardiovascular status: blood pressure returned to baseline and stable Postop Assessment: no apparent nausea or vomiting Anesthetic complications: no    Last Vitals:  Vitals:   08/15/18 0614 08/15/18 1019  BP: 94/61 104/67  Pulse: (!) 58 66  Resp: 16 18  Temp: 36.9 C 36.8 C  SpO2: 97% 99%    Last Pain:  Vitals:   08/15/18 1050  TempSrc:   PainSc: 7                  Lynzie Cliburn

## 2018-08-22 ENCOUNTER — Inpatient Hospital Stay: Payer: BC Managed Care – PPO | Attending: Hematology and Oncology | Admitting: Hematology and Oncology

## 2018-08-22 ENCOUNTER — Ambulatory Visit (INDEPENDENT_AMBULATORY_CARE_PROVIDER_SITE_OTHER): Payer: BC Managed Care – PPO | Admitting: Plastic Surgery

## 2018-08-22 ENCOUNTER — Other Ambulatory Visit: Payer: Self-pay | Admitting: Physician Assistant

## 2018-08-22 ENCOUNTER — Encounter: Payer: Self-pay | Admitting: Plastic Surgery

## 2018-08-22 VITALS — BP 110/70 | HR 65 | Ht 67.5 in | Wt 203.0 lb

## 2018-08-22 DIAGNOSIS — Z170 Estrogen receptor positive status [ER+]: Secondary | ICD-10-CM

## 2018-08-22 DIAGNOSIS — Z9010 Acquired absence of unspecified breast and nipple: Secondary | ICD-10-CM | POA: Insufficient documentation

## 2018-08-22 DIAGNOSIS — Z79810 Long term (current) use of selective estrogen receptor modulators (SERMs): Secondary | ICD-10-CM | POA: Insufficient documentation

## 2018-08-22 DIAGNOSIS — D0511 Intraductal carcinoma in situ of right breast: Secondary | ICD-10-CM | POA: Insufficient documentation

## 2018-08-22 DIAGNOSIS — Z9013 Acquired absence of bilateral breasts and nipples: Secondary | ICD-10-CM | POA: Diagnosis not present

## 2018-08-22 DIAGNOSIS — Z79899 Other long term (current) drug therapy: Secondary | ICD-10-CM | POA: Insufficient documentation

## 2018-08-22 DIAGNOSIS — C50412 Malignant neoplasm of upper-outer quadrant of left female breast: Secondary | ICD-10-CM

## 2018-08-22 DIAGNOSIS — D0512 Intraductal carcinoma in situ of left breast: Secondary | ICD-10-CM

## 2018-08-22 MED ORDER — HYDROCODONE-ACETAMINOPHEN 5-325 MG PO TABS: 1.0000 | ORAL_TABLET | Freq: Three times a day (TID) | ORAL | 0 refills | Status: DC | PRN

## 2018-08-22 MED ORDER — DIAZEPAM 2 MG PO TABS: 2.0000 mg | ORAL_TABLET | Freq: Three times a day (TID) | ORAL | 0 refills | Status: DC | PRN

## 2018-08-22 NOTE — Progress Notes (Signed)
   Subjective:    Patient ID: [name], female    DOB: 1971/08/02, 47 y.o.   MRN: [id number]  Ms. Pompa is a 47 year old white female here with her mother and her husband for follow-up on her bilateral breast surgery.  She underwent bilateral mastectomies with immediate reconstruction with expanders and flex HD.  There is some fluid I was able to milk out of the drain but no sign of hematoma or infection.  The incisions are healing well.  She is doing well with eating and pain control.  Drain output is as expected.  Bilateral: [place] 535 cc. 150 cc of injectable saline placed in the expander.     Review of Systems  Constitutional: Negative.   HENT: Negative.   Respiratory: Negative.   Gastrointestinal: Negative.   Genitourinary: Negative.   Skin: Negative.        Objective:   Physical Exam Vitals signs and nursing note reviewed.  Constitutional:      Appearance: Normal appearance.  HENT:     Head: Normocephalic.  Cardiovascular:     Rate and Rhythm: Normal rate.  Pulmonary:     Effort: Pulmonary effort is normal.  Neurological:     General: No focal deficit present.     Mental Status: She is alert.  Psychiatric:        Mood and Affect: Mood normal.        Thought Content: Thought content normal.        Judgment: Judgment normal.        Assessment & Plan:  Malignant neoplasm of upper-outer quadrant of left breast in female, estrogen receptor positive ([place])  Ductal carcinoma in situ (DCIS) of left breast  Acquired absence of both breasts  We placed injectable saline in the Expander using a sterile technique: Right: 50 cc for a total of 200 / 535 cc Left: 50 cc for a total of 200 / 535 cc  We will plan to remove the drains next week.

## 2018-08-22 NOTE — Assessment & Plan Note (Addendum)
08/14/2018:Left mastectomy: DCIS low-grade, 6 cm, margins negative, 0/3 lymph nodes negative ER 100%, PR 100%, Tis N0 stage 0, right mastectomy prophylactic: [place]  Pathology counseling: I discussed the final pathology report of the patient provided  a copy of this report. I discussed the margins as well as lymph node surgeries. We also discussed the final staging along with previously performed ER/PR testing.  Since she had bilateral mastectomies, there is no role of adjuvant radiation or adjuvant antiestrogen therapy.  I instructed her to discontinue tamoxifen.  She would like to follow-up with Dr. Marlou Starks for her annual checkups. We are happy to see her on an as-needed basis.

## 2018-08-23 ENCOUNTER — Telehealth: Payer: Self-pay | Admitting: Hematology and Oncology

## 2018-08-23 ENCOUNTER — Encounter: Payer: Self-pay | Admitting: *Deleted

## 2018-08-23 NOTE — Telephone Encounter (Signed)
Per 12/17 no los °

## 2018-08-25 ENCOUNTER — Telehealth: Payer: Self-pay | Admitting: Adult Health

## 2018-08-25 NOTE — Telephone Encounter (Signed)
Scheduled appt per 12/18 sch message - unable to schedule SCP in March - scheduled next available in May - sent reminder letter in the mail with appt date and time

## 2018-08-29 ENCOUNTER — Encounter: Payer: Self-pay | Admitting: Physician Assistant

## 2018-08-29 ENCOUNTER — Ambulatory Visit (INDEPENDENT_AMBULATORY_CARE_PROVIDER_SITE_OTHER): Payer: BC Managed Care – PPO | Admitting: Physician Assistant

## 2018-08-29 VITALS — BP 116/84 | HR 90 | Ht 67.5 in | Wt 206.0 lb

## 2018-08-29 DIAGNOSIS — C50412 Malignant neoplasm of upper-outer quadrant of left female breast: Secondary | ICD-10-CM

## 2018-08-29 DIAGNOSIS — Z170 Estrogen receptor positive status [ER+]: Secondary | ICD-10-CM

## 2018-08-29 DIAGNOSIS — Z9013 Acquired absence of bilateral breasts and nipples: Secondary | ICD-10-CM

## 2018-08-29 MED ORDER — HYDROCODONE-ACETAMINOPHEN 5-325 MG PO TABS: 1.0000 | ORAL_TABLET | Freq: Two times a day (BID) | ORAL | 0 refills | Status: DC | PRN

## 2018-08-29 NOTE — Progress Notes (Signed)
  Subjective:     Patient ID: Shannon May, female   DOB: 10-28-1970, 47 y.o.   MRN: [id number]  HPI Very pleasant 47 year old female patient presents the clinic 2 weeks status post bilateral breast mastectomy and expander placement.  Patient has been compliant with keeping drain output records.  She continues to get moderate amount of drain output from the right side.  Only minimal from the left side.  The patient reports increased pain and discomfort after filling of expander at her last office visit.  The pt is accompanied by her husband today.  Pt reports walking 1/2 mile several days.  Pt has started doing wall exercises with her arms.  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   Review of Systems  Constitutional: Negative.   Respiratory: Negative.   Cardiovascular: Negative.   Skin: Positive for wound.  Neurological: Negative.   Psychiatric/Behavioral: Negative.        Objective:   Physical Exam Constitutional:      Appearance: Normal appearance.  Pulmonary:     Effort: Pulmonary effort is normal.  Abdominal:     Palpations: Abdomen is soft.  Musculoskeletal: Normal range of motion.  Skin:    General: Skin is warm and dry.  Neurological:     Mental Status: She is alert and oriented to person, place, and time.  Psychiatric:        Mood and Affect: Mood normal.        Behavior: Behavior normal.        Thought Content: Thought content normal.        Judgment: Judgment normal.        Assessment:     S/p Mastectomy bilateral and expander placement    Plan:     I did pull the pts left drain We did leave the right drain in place for one more week. We did not do a fill into expanders today I did refill Norco #10 for prn use We did discuss PT in the near future

## 2018-09-04 ENCOUNTER — Telehealth: Payer: Self-pay | Admitting: *Deleted

## 2018-09-04 NOTE — Telephone Encounter (Addendum)
Patient called to say that yesterday she has noticed some swelling on her (R) side.  Today the swelling is worse and she also has felt a knot outside midway on the (R) side.  I asked her if there any worsen pain or discoloration.  Patient said no skin discoloration and the pain has not worsen.  She said she takes Tylenol for pain.  The patient also stated that tomorrow will be 2 weeks since her last fuller, and she has an appointment on tomorrow.   Asked the patient if she has a drain tube, and she said yes on the (R) side and the fluid is draining.  Patient stated that she just wanted to know if she is having an reaction to the expander or is the swelling normal.  Spoke with Asencion Partridge and informed her of the message.  She said to let the patient know that the swelling is normal and she can take Ibuprofen for the swelling,and the knot could be fluid or the tube she is feeling.  Related the message from [place] to the patient and the patient verbalized understanding and agree,and also stated that she does also take Ibuprofen.//AB/CMA

## 2018-09-05 ENCOUNTER — Ambulatory Visit (INDEPENDENT_AMBULATORY_CARE_PROVIDER_SITE_OTHER): Payer: BC Managed Care – PPO | Admitting: Plastic Surgery

## 2018-09-05 ENCOUNTER — Encounter: Payer: Self-pay | Admitting: Plastic Surgery

## 2018-09-05 VITALS — BP 142/73 | HR 67 | Temp 98.1°F | Ht 67.5 in | Wt 202.0 lb

## 2018-09-05 DIAGNOSIS — Z170 Estrogen receptor positive status [ER+]: Secondary | ICD-10-CM

## 2018-09-05 DIAGNOSIS — C50412 Malignant neoplasm of upper-outer quadrant of left female breast: Secondary | ICD-10-CM

## 2018-09-05 DIAGNOSIS — Z9013 Acquired absence of bilateral breasts and nipples: Secondary | ICD-10-CM

## 2018-09-05 DIAGNOSIS — D0512 Intraductal carcinoma in situ of left breast: Secondary | ICD-10-CM

## 2018-09-05 DIAGNOSIS — N62 Hypertrophy of breast: Secondary | ICD-10-CM

## 2018-09-05 MED ORDER — DIAZEPAM 2 MG PO TABS: 2.0000 mg | ORAL_TABLET | Freq: Two times a day (BID) | ORAL | 0 refills | Status: AC | PRN

## 2018-09-05 MED ORDER — ONDANSETRON HCL 4 MG PO TABS: 4.0000 mg | ORAL_TABLET | Freq: Every day | ORAL | 0 refills | Status: AC | PRN

## 2018-09-05 NOTE — Progress Notes (Signed)
   Subjective:    Patient ID: Shannon May, female    DOB: 12/27/1970, 47 y.o.   MRN: [id number]  Shannon May is a 47 year old white female here for follow-up on her bilateral breast reconstruction.  She underwent bilateral mastectomies with expander placement.  She is increasing her activity slowly.  She still has the right drain in place.  The output has been minimal.  I do not see or feel a seroma or hematoma.  The incision is healing well.  There is no sign of infection.    Review of Systems  Constitutional: Negative.   HENT: Negative.   Eyes: Negative.   Respiratory: Negative.   Gastrointestinal: Negative.   Endocrine: Negative.   Genitourinary: Negative.   Skin: Negative.  Negative for color change and wound.      Objective:   Physical Exam Vitals signs and nursing note reviewed.  Constitutional:      Appearance: Normal appearance.  HENT:     Head: Normocephalic and atraumatic.  Cardiovascular:     Rate and Rhythm: Normal rate.  Neurological:     Mental Status: She is alert.  Psychiatric:        Mood and Affect: Mood normal.        Thought Content: Thought content normal.        Judgment: Judgment normal.        Assessment & Plan:  Symptomatic mammary hypertrophy  Malignant neoplasm of upper-outer quadrant of left breast in female, estrogen receptor positive ([place])  Ductal carcinoma in situ (DCIS) of left breast  Acquired absence of both breasts  The patient asked for a note to be reimbursed for a plane ticket as she does not feel she is ready to fly to [place] this weekend.  The note was given.  The drain was removed on the right. Continue with compression and may shower tonight. We placed injectable saline in the Expander using a sterile technique: Right: 50 cc for a total of 250 / 535 cc Left: 50 cc for a total of 250 / 535 cc

## 2018-09-12 ENCOUNTER — Ambulatory Visit: Payer: BC Managed Care – PPO | Admitting: Sports Medicine

## 2018-09-15 ENCOUNTER — Telehealth: Payer: Self-pay | Admitting: *Deleted

## 2018-09-15 ENCOUNTER — Encounter: Payer: Self-pay | Admitting: Plastic Surgery

## 2018-09-15 ENCOUNTER — Ambulatory Visit (INDEPENDENT_AMBULATORY_CARE_PROVIDER_SITE_OTHER): Payer: BC Managed Care – PPO | Admitting: Plastic Surgery

## 2018-09-15 VITALS — BP 124/83 | HR 72 | Temp 97.6°F | Ht 67.5 in | Wt 202.0 lb

## 2018-09-15 DIAGNOSIS — C50412 Malignant neoplasm of upper-outer quadrant of left female breast: Secondary | ICD-10-CM

## 2018-09-15 DIAGNOSIS — Z9013 Acquired absence of bilateral breasts and nipples: Secondary | ICD-10-CM | POA: Insufficient documentation

## 2018-09-15 DIAGNOSIS — Z170 Estrogen receptor positive status [ER+]: Secondary | ICD-10-CM

## 2018-09-15 MED ORDER — DIAZEPAM 2 MG PO TABS: 2.0000 mg | ORAL_TABLET | Freq: Two times a day (BID) | ORAL | 0 refills | Status: AC | PRN

## 2018-09-15 NOTE — Progress Notes (Signed)
   Subjective:    Patient ID: Shannon May, female    DOB: 11-May-1971, 48 y.o.   MRN: [id number]  The patient is a 48 year old white female here with her husband for her follow-up on her bilateral breast reconstruction.  She underwent bilateral mastectomies and has been expanded to 250 cc in each breast.  She is feeling much better.  There is no sign of hematoma or seroma.  She has some folding of the skin as expected with larger breasts preoperatively.  No sign of infection.    Review of Systems  Constitutional: Negative.   HENT: Negative.   Eyes: Negative.   Respiratory: Negative.   Endocrine: Negative.   Genitourinary: Negative.        Objective:   Physical Exam Vitals signs and nursing note reviewed.  Constitutional:      Appearance: Normal appearance.  Cardiovascular:     Rate and Rhythm: Normal rate.  Neurological:     General: No focal deficit present.     Mental Status: She is alert.  Psychiatric:        Mood and Affect: Mood normal.        Thought Content: Thought content normal.        Judgment: Judgment normal.        Assessment & Plan:  Acquired absence of both breasts  Malignant neoplasm of upper-outer quadrant of left breast in female, estrogen receptor positive ([facility])  S/P mastectomy, bilateral We placed injectable saline in the Expander using a sterile technique: Right: 50 cc for a total of 300 / 535 cc Left: 50 cc for a total of 300 / 535 cc

## 2018-09-15 NOTE — Telephone Encounter (Signed)
Called pt and informed her that her and her husband's forms have been completed and are up front ready for p/u. Copy scanned and placed in chart .Marland KitchenAudelia May [place]

## 2018-09-22 ENCOUNTER — Telehealth: Payer: Self-pay | Admitting: Physician Assistant

## 2018-09-22 ENCOUNTER — Other Ambulatory Visit: Payer: Self-pay | Admitting: Physician Assistant

## 2018-09-22 MED ORDER — KETOCONAZOLE 2 % EX CREA: 1.0000 "application " | TOPICAL_CREAM | Freq: Every day | CUTANEOUS | 0 refills | Status: DC

## 2018-09-22 NOTE — Telephone Encounter (Signed)
Sent in ketoconazole for pt

## 2018-09-29 ENCOUNTER — Encounter: Payer: Self-pay | Admitting: Plastic Surgery

## 2018-09-29 ENCOUNTER — Ambulatory Visit (INDEPENDENT_AMBULATORY_CARE_PROVIDER_SITE_OTHER): Payer: BC Managed Care – PPO | Admitting: Plastic Surgery

## 2018-09-29 VITALS — BP 126/87 | HR 70 | Temp 98.0°F | Ht 67.5 in | Wt 202.0 lb

## 2018-09-29 DIAGNOSIS — Z9013 Acquired absence of bilateral breasts and nipples: Secondary | ICD-10-CM

## 2018-09-29 NOTE — Progress Notes (Addendum)
   Subjective:    Patient ID: Shannon May, female    DOB: 05-02-1971, 48 y.o.   MRN: [id number]  Denzil is a 48 year old female here with her husband for follow-up on her bilateral breast reconstruction.   There is no sign of hematoma or seroma.  She is tolerating expansions well.  She is becoming more active but still nervous about range of motion of her upper extremities.  The incisions are healing well without any signs of breakdown.   Review of Systems  Constitutional: Negative.   HENT: Negative.   Eyes: Negative.   Respiratory: Negative.   Cardiovascular: Negative.   Gastrointestinal: Negative.   Endocrine: Negative.   Genitourinary: Negative.   Musculoskeletal: Negative.   Skin: Negative.        Objective:   Physical Exam Vitals signs and nursing note reviewed.  Constitutional:      Appearance: Normal appearance.  HENT:     Head: Normocephalic and atraumatic.     Nose: Nose normal.     Mouth/Throat:     Mouth: Mucous membranes are moist.  Eyes:     Extraocular Movements: Extraocular movements intact.  Neck:     Musculoskeletal: Normal range of motion.  Cardiovascular:     Rate and Rhythm: Normal rate.  Pulmonary:     Effort: Pulmonary effort is normal.  Abdominal:     General: Abdomen is flat.  Neurological:     Mental Status: She is alert.  Psychiatric:        Mood and Affect: Mood normal.        Thought Content: Thought content normal.        Judgment: Judgment normal.        Assessment & Plan:  S/P mastectomy, bilateral  Acquired absence of both breasts  We placed injectable saline in the Expander using a sterile technique: Right: 70 cc for a total of 370 / 535 cc Left: 70 cc for a total of 370 / 535 cc

## 2018-10-13 ENCOUNTER — Encounter: Payer: Self-pay | Admitting: Plastic Surgery

## 2018-10-13 ENCOUNTER — Ambulatory Visit (INDEPENDENT_AMBULATORY_CARE_PROVIDER_SITE_OTHER): Payer: BC Managed Care – PPO | Admitting: Plastic Surgery

## 2018-10-13 VITALS — BP 124/83 | HR 73 | Temp 98.1°F | Ht 67.5 in | Wt 202.0 lb

## 2018-10-13 DIAGNOSIS — Z9013 Acquired absence of bilateral breasts and nipples: Secondary | ICD-10-CM

## 2018-10-13 NOTE — Progress Notes (Signed)
   Subjective:    Patient ID: Shannon May, female    DOB: 1971/03/06, 48 y.o.   MRN: [id number]  The patient is a 48 yrs wf here with her husband for follow up on her bilateral breast reconstruction.  She is doing very well and getting more active.  There is no sign of seroma or hematoma.  There is no sign of infection.  Her incisions are healing well.   Review of Systems  Constitutional: Negative.   HENT: Negative.   Eyes: Negative.   Respiratory: Negative.   Cardiovascular: Negative.   Gastrointestinal: Negative.   Endocrine: Negative.   Genitourinary: Negative.   Musculoskeletal: Negative.   Skin: Negative.  Negative for color change and wound.  Hematological: Negative.   Psychiatric/Behavioral: Negative.        Objective:   Physical Exam Vitals signs and nursing note reviewed.  Constitutional:      Appearance: Normal appearance.  HENT:     Head: Normocephalic and atraumatic.     Right Ear: External ear normal.     Left Ear: External ear normal.     Nose: Nose normal.  Cardiovascular:     Rate and Rhythm: Normal rate.  Pulmonary:     Effort: Pulmonary effort is normal.  Neurological:     General: No focal deficit present.     Mental Status: She is alert.  Psychiatric:        Mood and Affect: Mood normal.        Thought Content: Thought content normal.        Judgment: Judgment normal.        Assessment & Plan:  S/P mastectomy, bilateral  Acquired absence of both breasts We placed injectable saline in the Expander using a sterile technique: Right: 50 cc for a total of 420 / 535 cc Left: 50 cc for a total of 420 / 535 cc She would like to start planning on her exchange surgery to be around March 11 or 12.  This would be removal of bilateral expanders and placement of bilateral silicone implants.

## 2018-10-22 ENCOUNTER — Other Ambulatory Visit: Payer: Self-pay | Admitting: Family Medicine

## 2018-10-27 ENCOUNTER — Ambulatory Visit (INDEPENDENT_AMBULATORY_CARE_PROVIDER_SITE_OTHER): Payer: Self-pay | Admitting: Plastic Surgery

## 2018-10-27 ENCOUNTER — Encounter: Payer: Self-pay | Admitting: Plastic Surgery

## 2018-10-27 VITALS — BP 128/86 | HR 67 | Temp 98.0°F | Ht 67.5 in | Wt 202.0 lb

## 2018-10-27 DIAGNOSIS — Z9013 Acquired absence of bilateral breasts and nipples: Secondary | ICD-10-CM

## 2018-10-27 DIAGNOSIS — D0512 Intraductal carcinoma in situ of left breast: Secondary | ICD-10-CM

## 2018-10-27 MED ORDER — ONDANSETRON HCL 4 MG PO TABS: 4.0000 mg | ORAL_TABLET | Freq: Three times a day (TID) | ORAL | 0 refills | Status: AC | PRN

## 2018-10-27 MED ORDER — HYDROCODONE-ACETAMINOPHEN 5-325 MG PO TABS: 1.0000 | ORAL_TABLET | Freq: Four times a day (QID) | ORAL | 0 refills | Status: AC | PRN

## 2018-10-27 MED ORDER — CEPHALEXIN 500 MG PO CAPS: 500.0000 mg | ORAL_CAPSULE | Freq: Four times a day (QID) | ORAL | 0 refills | Status: AC

## 2018-10-27 MED ORDER — DIAZEPAM 2 MG PO TABS: 2.0000 mg | ORAL_TABLET | Freq: Four times a day (QID) | ORAL | 0 refills | Status: AC | PRN

## 2018-10-27 NOTE — H&P (View-Only) (Signed)
Patient ID: Shannon May, female    DOB: [birth year], 48 y.o.   MRN: [id number]   Chief Complaint  Patient presents with  . Pre-op Exam    Removal of (B) expanders, placement of (B) implants  . Breast Problem    Shannon May is a 48 year old female here with her mom and her husband for a history and physical for second stage bilateral breast reconstruction.  She is doing well with expansion and getting comfortable with the size.  She still has excess skin and soft tissue that will need to be revised.  There is no sign of infection or seroma.  She will likely have 1 more expansion after today.    Review of Systems  Constitutional: Negative.  Negative for appetite change.  HENT: Negative.   Respiratory: Negative.  Negative for chest tightness.   Cardiovascular: Negative.   Gastrointestinal: Negative.   Genitourinary: Negative.   Musculoskeletal: Negative.   Skin: Negative.  Negative for wound.  Psychiatric/Behavioral: Negative.     Past Medical History:  Diagnosis Date  . Anxiety   . Diverticulosis   . Hypertension 11/08/2017  . Malignant neoplasm of upper-outer quadrant of left breast in female, estrogen receptor positive ([place]) 06/08/2018  . Sinusitis 11/15/2017    Past Surgical History:  Procedure Laterality Date  . BREAST RECONSTRUCTION WITH PLACEMENT OF TISSUE EXPANDER AND ALLODERM N/A 08/14/2018   Procedure: BREAST RECONSTRUCTION WITH PLACEMENT OF TISSUE EXPANDER AND FLEXHD;  Surgeon: Wallace Going, DO;  Location: [facility];  Service: Plastics;  Laterality: N/A;  . BREAST SURGERY    . DILATION AND CURETTAGE OF UTERUS    . LASIK    . MASTECTOMY W/ SENTINEL NODE BIOPSY Bilateral 08/14/2018   with breast reconstruction  . MASTECTOMY W/ SENTINEL NODE BIOPSY Bilateral 08/14/2018   Procedure: BILATERAL MASTECTOMIES  WITH LEFT SENTINEL LYMPH NODE MAPPING;  Surgeon: Jovita Kussmaul, MD;  Location: [facility];  Service: General;  Laterality: Bilateral;      Current Outpatient  Medications:  .  ALPRAZolam (XANAX) 0.5 MG tablet, Take 1-2 tablets (0.5-1 mg total) by mouth daily as needed for anxiety., Disp: 12 tablet, Rfl: 0 .  Docusate Calcium (STOOL SOFTENER PO), Take 1 tablet by mouth 2 (two) times daily. , Disp: , Rfl:  .  fluticasone (FLONASE) 50 MCG/ACT nasal spray, PLACE 2 SPRAYS INTO THE NOSE DAILY (Patient taking differently: Place 2 sprays into both nostrils daily. ), Disp: 16 g, Rfl: 1 .  hydrochlorothiazide (MICROZIDE) 12.5 MG capsule, TAKE 1 CAPSULE BY MOUTH EVERY DAY, Disp: 90 capsule, Rfl: 0 .  loratadine (CLARITIN) 10 MG tablet, Take 10 mg by mouth daily., Disp: , Rfl:  .  Multiple Vitamins-Minerals (MULTIVITAMIN WOMEN PO), Take 1 capsule by mouth daily., Disp: , Rfl:  .  Probiotic Product (PRO-BIOTIC BLEND PO), Take 1 tablet by mouth daily. , Disp: , Rfl:  .  cephALEXin (KEFLEX) 500 MG capsule, Take 1 capsule (500 mg total) by mouth 4 (four) times daily for 3 days., Disp: 12 capsule, Rfl: 0 .  diazepam (VALIUM) 2 MG tablet, Take 1 tablet (2 mg total) by mouth every 6 (six) hours as needed for up to 5 days for muscle spasms., Disp: 20 tablet, Rfl: 0 .  HYDROcodone-acetaminophen (NORCO) 5-325 MG tablet, Take 1 tablet by mouth every 6 (six) hours as needed for up to 5 days for moderate pain., Disp: 20 tablet, Rfl: 0 .  ondansetron (ZOFRAN) 4 MG tablet, Take 1 tablet (4 mg  total) by mouth every 8 (eight) hours as needed for up to 5 days for nausea or vomiting., Disp: 15 tablet, Rfl: 0   Objective:   Vitals:   10/27/18 1136  BP: 128/86  Pulse: 67  Temp: 98 F (36.7 C)  SpO2: 100%    Physical Exam Vitals signs and nursing note reviewed.  Constitutional:      Appearance: Normal appearance.  HENT:     Head: Normocephalic and atraumatic.     Mouth/Throat:     Mouth: Mucous membranes are moist.  Cardiovascular:     Rate and Rhythm: Normal rate.  Pulmonary:     Effort: Pulmonary effort is normal.  Abdominal:     General: Abdomen is flat. There is no  distension.  Skin:    General: Skin is warm.  Neurological:     General: No focal deficit present.     Mental Status: She is alert.  Psychiatric:        Mood and Affect: Mood normal.        Thought Content: Thought content normal.        Judgment: Judgment normal.     Assessment & Plan:  Ductal carcinoma in situ (DCIS) of left breast  Acquired absence of both breasts  S/P mastectomy, bilateral  We placed injectable saline in the Expander using a sterile technique: Right: 50 cc for a total of 470 / 535 cc Left: 50 cc for a total of 470 / 535 cc  Plan for bilateral breast expander removal and placement of implants.  We discussed the risks as described below. We also discussed that the scar will likely be longer in order to achieve better contour with the excess tissue and skin.  We may need to send her for physical therapy after the exchange to help with range of motion and strength building.  The risks that can be encountered with and after placement of a breast implan were discussed and include the following but not limited to these: bleeding, infection, delayed healing, anesthesia risks, skin sensation changes, injury to structures including nerves, blood vessels, and muscles which may be temporary or permanent, allergies to tape, suture materials and glues, blood products, topical preparations or injected agents, skin contour irregularities, skin discoloration and swelling, deep vein thrombosis, cardiac and pulmonary complications, pain, which may persist, fluid accumulation, wrinkling of the skin over the expander, changes in nipple or breast sensation, expander leakage or rupture, faulty position of the expander, persistent pain, formation of tight scar tissue around the expander (capsular contracture), possible need for revisional surgery or staged procedures.    [place], DO

## 2018-10-27 NOTE — Progress Notes (Signed)
Patient ID: Shannon May, female    DOB: 1971/03/12, 48 y.o.   MRN: [id number]   Chief Complaint  Patient presents with  . Pre-op Exam    Removal of (B) expanders, placement of (B) implants  . Breast Problem    Shannon May is a 48 year old female here with her mom and her husband for a history and physical for second stage bilateral breast reconstruction.  She is doing well with expansion and getting comfortable with the size.  She still has excess skin and soft tissue that will need to be revised.  There is no sign of infection or seroma.  She will likely have 1 more expansion after today.    Review of Systems  Constitutional: Negative.  Negative for appetite change.  HENT: Negative.   Respiratory: Negative.  Negative for chest tightness.   Cardiovascular: Negative.   Gastrointestinal: Negative.   Genitourinary: Negative.   Musculoskeletal: Negative.   Skin: Negative.  Negative for wound.  Psychiatric/Behavioral: Negative.     Past Medical History:  Diagnosis Date  . Anxiety   . Diverticulosis   . Hypertension 11/08/2017  . Malignant neoplasm of upper-outer quadrant of left breast in female, estrogen receptor positive ([place]) 06/08/2018  . Sinusitis 11/15/2017    Past Surgical History:  Procedure Laterality Date  . BREAST RECONSTRUCTION WITH PLACEMENT OF TISSUE EXPANDER AND ALLODERM N/A 08/14/2018   Procedure: BREAST RECONSTRUCTION WITH PLACEMENT OF TISSUE EXPANDER AND FLEXHD;  Surgeon: Wallace Going, DO;  Location: [facility];  Service: Plastics;  Laterality: N/A;  . BREAST SURGERY    . DILATION AND CURETTAGE OF UTERUS    . LASIK    . MASTECTOMY W/ SENTINEL NODE BIOPSY Bilateral 08/14/2018   with breast reconstruction  . MASTECTOMY W/ SENTINEL NODE BIOPSY Bilateral 08/14/2018   Procedure: BILATERAL MASTECTOMIES  WITH LEFT SENTINEL LYMPH NODE MAPPING;  Surgeon: Jovita Kussmaul, MD;  Location: [facility];  Service: General;  Laterality: Bilateral;      Current Outpatient  Medications:  .  ALPRAZolam (XANAX) 0.5 MG tablet, Take 1-2 tablets (0.5-1 mg total) by mouth daily as needed for anxiety., Disp: 12 tablet, Rfl: 0 .  Docusate Calcium (STOOL SOFTENER PO), Take 1 tablet by mouth 2 (two) times daily. , Disp: , Rfl:  .  fluticasone (FLONASE) 50 MCG/ACT nasal spray, PLACE 2 SPRAYS INTO THE NOSE DAILY (Patient taking differently: Place 2 sprays into both nostrils daily. ), Disp: 16 g, Rfl: 1 .  hydrochlorothiazide (MICROZIDE) 12.5 MG capsule, TAKE 1 CAPSULE BY MOUTH EVERY DAY, Disp: 90 capsule, Rfl: 0 .  loratadine (CLARITIN) 10 MG tablet, Take 10 mg by mouth daily., Disp: , Rfl:  .  Multiple Vitamins-Minerals (MULTIVITAMIN WOMEN PO), Take 1 capsule by mouth daily., Disp: , Rfl:  .  Probiotic Product (PRO-BIOTIC BLEND PO), Take 1 tablet by mouth daily. , Disp: , Rfl:  .  cephALEXin (KEFLEX) 500 MG capsule, Take 1 capsule (500 mg total) by mouth 4 (four) times daily for 3 days., Disp: 12 capsule, Rfl: 0 .  diazepam (VALIUM) 2 MG tablet, Take 1 tablet (2 mg total) by mouth every 6 (six) hours as needed for up to 5 days for muscle spasms., Disp: 20 tablet, Rfl: 0 .  HYDROcodone-acetaminophen (NORCO) 5-325 MG tablet, Take 1 tablet by mouth every 6 (six) hours as needed for up to 5 days for moderate pain., Disp: 20 tablet, Rfl: 0 .  ondansetron (ZOFRAN) 4 MG tablet, Take 1 tablet (4 mg  total) by mouth every 8 (eight) hours as needed for up to 5 days for nausea or vomiting., Disp: 15 tablet, Rfl: 0   Objective:   Vitals:   10/27/18 1136  BP: 128/86  Pulse: 67  Temp: 98 F (36.7 C)  SpO2: 100%    Physical Exam Vitals signs and nursing note reviewed.  Constitutional:      Appearance: Normal appearance.  HENT:     Head: Normocephalic and atraumatic.     Mouth/Throat:     Mouth: Mucous membranes are moist.  Cardiovascular:     Rate and Rhythm: Normal rate.  Pulmonary:     Effort: Pulmonary effort is normal.  Abdominal:     General: Abdomen is flat. There is no  distension.  Skin:    General: Skin is warm.  Neurological:     General: No focal deficit present.     Mental Status: She is alert.  Psychiatric:        Mood and Affect: Mood normal.        Thought Content: Thought content normal.        Judgment: Judgment normal.     Assessment & Plan:  Ductal carcinoma in situ (DCIS) of left breast  Acquired absence of both breasts  S/P mastectomy, bilateral  We placed injectable saline in the Expander using a sterile technique: Right: 50 cc for a total of 470 / 535 cc Left: 50 cc for a total of 470 / 535 cc  Plan for bilateral breast expander removal and placement of implants.  We discussed the risks as described below. We also discussed that the scar will likely be longer in order to achieve better contour with the excess tissue and skin.  We may need to send her for physical therapy after the exchange to help with range of motion and strength building.  The risks that can be encountered with and after placement of a breast implan were discussed and include the following but not limited to these: bleeding, infection, delayed healing, anesthesia risks, skin sensation changes, injury to structures including nerves, blood vessels, and muscles which may be temporary or permanent, allergies to tape, suture materials and glues, blood products, topical preparations or injected agents, skin contour irregularities, skin discoloration and swelling, deep vein thrombosis, cardiac and pulmonary complications, pain, which may persist, fluid accumulation, wrinkling of the skin over the expander, changes in nipple or breast sensation, expander leakage or rupture, faulty position of the expander, persistent pain, formation of tight scar tissue around the expander (capsular contracture), possible need for revisional surgery or staged procedures.    [place], DO

## 2018-11-07 ENCOUNTER — Encounter (HOSPITAL_BASED_OUTPATIENT_CLINIC_OR_DEPARTMENT_OTHER): Payer: Self-pay | Admitting: *Deleted

## 2018-11-07 ENCOUNTER — Other Ambulatory Visit: Payer: Self-pay

## 2018-11-09 ENCOUNTER — Ambulatory Visit: Payer: BC Managed Care – PPO | Admitting: Family Medicine

## 2018-11-09 ENCOUNTER — Other Ambulatory Visit (HOSPITAL_COMMUNITY)
Admission: RE | Admit: 2018-11-09 | Discharge: 2018-11-09 | Disposition: A | Discharge status: Home / Self Care | Payer: BC Managed Care – PPO | Source: Ambulatory Visit | Attending: Family Medicine | Admitting: Family Medicine

## 2018-11-09 ENCOUNTER — Encounter: Payer: Self-pay | Admitting: Family Medicine

## 2018-11-09 ENCOUNTER — Ambulatory Visit (INDEPENDENT_AMBULATORY_CARE_PROVIDER_SITE_OTHER): Payer: BC Managed Care – PPO | Admitting: Family Medicine

## 2018-11-09 VITALS — BP 124/50 | HR 78 | Ht 68.0 in | Wt 202.0 lb

## 2018-11-09 DIAGNOSIS — F320 Major depressive disorder, single episode, mild: Secondary | ICD-10-CM | POA: Diagnosis not present

## 2018-11-09 DIAGNOSIS — Z0000 Encounter for general adult medical examination without abnormal findings: Secondary | ICD-10-CM

## 2018-11-09 DIAGNOSIS — Z124 Encounter for screening for malignant neoplasm of cervix: Secondary | ICD-10-CM | POA: Diagnosis not present

## 2018-11-09 DIAGNOSIS — F439 Reaction to severe stress, unspecified: Secondary | ICD-10-CM

## 2018-11-09 DIAGNOSIS — R87612 Low grade squamous intraepithelial lesion on cytologic smear of cervix (LGSIL): Secondary | ICD-10-CM

## 2018-11-09 NOTE — Patient Instructions (Signed)

## 2018-11-09 NOTE — Progress Notes (Signed)
Subjective:     Shannon May is a 48 y.o. female and is here for a comprehensive physical exam. The patient reports no problems.  Doing well overall.  She was diagnosed with breast cancer and had bilateral mastectomy done.  She has spacers and and is scheduled for surgery later this month for implants.  She is otherwise doing well and has been trying very hard to keep a positive attitude though she is been quite tearful.  She is interested in talking with someone.  She has been try to work on getting her weight under control and has been able to lose about 15 pounds over this last year.  She denies any recent chest pain or shortness of breath with activity or exercise.  Social History   Socioeconomic History  . Marital status: Married    Spouse name: Ronalee Belts  . Number of children: 3  . Years of education: Masters  . Highest education level: Not on file  Occupational History  . Occupation: Education officer, museum    Comment: [employer]    Employer: [employer]  . Financial resource strain: Not on file  . Food insecurity:    Worry: Not on file    Inability: Not on file  . Transportation needs:    Medical: No    Non-medical: No  Tobacco Use  . Smoking status: Former Smoker    Types: Cigarettes    Last attempt to quit: 06/13/1988    Years since quitting: 30.4  . Smokeless tobacco: Never Used  . Tobacco comment: She smoked a couple of months in her teens.   Substance and Sexual Activity  . Alcohol use: Yes    Alcohol/week: 3.0 standard drinks    Types: 3 Glasses of wine per week  . Drug use: No  . Sexual activity: Yes    Partners: Male  Lifestyle  . Physical activity:    Days per week: Not on file    Minutes per session: Not on file  . Stress: Not on file  Relationships  . Social connections:    Talks on phone: Not on file    Gets together: Not on file    Attends religious service: Not on file    Active member of club or organization: Not on file    Attends  meetings of clubs or organizations: Not on file    Relationship status: Not on file  . Intimate partner violence:    Fear of current or ex partner: No    Emotionally abused: No    Physically abused: No    Forced sexual activity: No  Other Topics Concern  . Not on file  Social History Narrative   Treadmill 5 days per week 2 caffeine drinks per day.    Health Maintenance  Topic Date Due  . PAP SMEAR-Modifier  02/25/2020  . TETANUS/TDAP  05/11/2028  . INFLUENZA VACCINE  Completed  . HIV Screening  Completed    The following portions of the patient's history were reviewed and updated as appropriate: allergies, current medications, past family history, past medical history, past social history, past surgical history and problem list.  Review of Systems A comprehensive review of systems was negative.   Objective:    BP (!) 124/50   Pulse 78   Ht 5\' 8"  (1.727 m)   Wt 202 lb (91.6 kg)   SpO2 100%   BMI 30.71 kg/m  General appearance: alert, cooperative and appears stated age Head: Normocephalic,  without obvious abnormality, atraumatic Eyes: conj clear, EOMI, PEERLA Ears: normal TM's and external ear canals both ears Nose: Nares normal. Septum midline. Mucosa normal. No drainage or sinus tenderness. Throat: lips, mucosa, and tongue normal; teeth and gums normal Neck: no adenopathy, no carotid bruit, no JVD, supple, symmetrical, trachea midline and thyroid not enlarged, symmetric, no tenderness/mass/nodules Back: symmetric, no curvature. ROM normal. No CVA tenderness. Lungs: clear to auscultation bilaterally Breasts: incisions are healing well and she has spacers in.  Heart: regular rate and rhythm, S1, S2 normal, no murmur, click, rub or gallop Abdomen: soft, non-tender; bowel sounds normal; no masses,  no organomegaly Pelvic: cervix normal in appearance, external genitalia normal, no adnexal masses or tenderness, no cervical motion tenderness, rectovaginal septum normal, uterus  normal size, shape, and consistency and vagina normal without discharge Extremities: extremities normal, atraumatic, no cyanosis or edema Pulses: 2+ and symmetric Skin: Skin color, texture, turgor normal. No rashes or lesions Lymph nodes: Cervical, supraclavicular, and axillary nodes normal. Neurologic: Alert and oriented X 3, normal strength and tone. Normal symmetric reflexes. Normal coordination and gait    Assessment:    Healthy female exam.      Plan:     See After Visit Summary for Counseling Recommendations   Keep up a regular exercise program and make sure you are eating a healthy diet Try to eat 4 servings of dairy a day, or if you are lactose intolerant take a calcium with vitamin D daily.  Your vaccines are up to date.  She preferred to go ahead and do Pap smear today so we did.  We will call with results once available. Blood pressure looks great. Continue to work on regular exercise.  She also has felt more down and sad after having her breast cancer and bilateral mastectomy.  She really is trying to stay positive but reports that she still struggling with some emotions and tearfulness.  We discussed joining the support group at the cancer center.  She says she is really hesitated to go because she is felt like her cancer has not been as severe as other people's because she is not having to undergo chemotherapy or radiation therapy.  I strongly encouraged her to go and she also seemed very open to individual counseling.  So have placed a referral to [facility].

## 2018-11-10 ENCOUNTER — Encounter: Payer: Self-pay | Admitting: Plastic Surgery

## 2018-11-10 ENCOUNTER — Ambulatory Visit (INDEPENDENT_AMBULATORY_CARE_PROVIDER_SITE_OTHER): Payer: BC Managed Care – PPO | Admitting: Plastic Surgery

## 2018-11-10 ENCOUNTER — Encounter (HOSPITAL_BASED_OUTPATIENT_CLINIC_OR_DEPARTMENT_OTHER)
Admission: RE | Admit: 2018-11-10 | Discharge: 2018-11-10 | Disposition: A | Discharge status: Home / Self Care | Payer: BC Managed Care – PPO | Source: Ambulatory Visit | Attending: Plastic Surgery | Admitting: Plastic Surgery

## 2018-11-10 VITALS — BP 119/80 | HR 63 | Temp 98.4°F | Ht 68.0 in | Wt 202.0 lb

## 2018-11-10 DIAGNOSIS — Z9013 Acquired absence of bilateral breasts and nipples: Secondary | ICD-10-CM

## 2018-11-10 DIAGNOSIS — Z888 Allergy status to other drugs, medicaments and biological substances status: Secondary | ICD-10-CM | POA: Diagnosis not present

## 2018-11-10 DIAGNOSIS — Z86000 Personal history of in-situ neoplasm of breast: Secondary | ICD-10-CM | POA: Diagnosis not present

## 2018-11-10 DIAGNOSIS — Z01818 Encounter for other preprocedural examination: Secondary | ICD-10-CM | POA: Insufficient documentation

## 2018-11-10 DIAGNOSIS — F419 Anxiety disorder, unspecified: Secondary | ICD-10-CM | POA: Diagnosis not present

## 2018-11-10 DIAGNOSIS — Z79899 Other long term (current) drug therapy: Secondary | ICD-10-CM | POA: Diagnosis not present

## 2018-11-10 DIAGNOSIS — Z881 Allergy status to other antibiotic agents status: Secondary | ICD-10-CM | POA: Diagnosis not present

## 2018-11-10 DIAGNOSIS — Z421 Encounter for breast reconstruction following mastectomy: Secondary | ICD-10-CM | POA: Diagnosis not present

## 2018-11-10 DIAGNOSIS — Z882 Allergy status to sulfonamides status: Secondary | ICD-10-CM | POA: Diagnosis not present

## 2018-11-10 DIAGNOSIS — I10 Essential (primary) hypertension: Secondary | ICD-10-CM | POA: Diagnosis not present

## 2018-11-10 LAB — BASIC METABOLIC PANEL
Anion gap: 9 (ref 5–15)
BUN: 10 mg/dL (ref 6–20)
CO2: 25 mmol/L (ref 22–32)
Calcium: 9.5 mg/dL (ref 8.9–10.3)
Chloride: 104 mmol/L (ref 98–111)
Creatinine, Ser: 0.75 mg/dL (ref 0.44–1.00)
GFR calc Af Amer: >60 mL/min (ref >60)
GFR calc non Af Amer: >60 mL/min (ref >60)
Glucose, Bld: 96 mg/dL (ref 70–99)
Potassium: 4.5 mmol/L (ref 3.5–5.1)
Sodium: 138 mmol/L (ref 135–145)

## 2018-11-10 NOTE — Progress Notes (Signed)
   Subjective:    Patient ID: Shannon May, female    DOB: 1971-07-19, 48 y.o.   MRN: [id number]  The patient is a 48 year old female here with her husband for follow-up on her breast reconstruction.  She is here for further expansion.  She is not had any recent illnesses.  She is still waiting for her grandbaby to be born.  She is doing well and feeling good.   Review of Systems  Constitutional: Negative.   HENT: Negative.   Eyes: Negative.   Respiratory: Negative.   Genitourinary: Negative.   Musculoskeletal: Negative.   Psychiatric/Behavioral: Negative.        Objective:   Physical Exam Vitals signs and nursing note reviewed.  Constitutional:      Appearance: Normal appearance.  HENT:     Head: Normocephalic and atraumatic.     Nose: Nose normal.  Cardiovascular:     Rate and Rhythm: Normal rate.  Pulmonary:     Effort: Pulmonary effort is normal.  Neurological:     General: No focal deficit present.     Mental Status: She is alert.  Psychiatric:        Mood and Affect: Mood normal.        Thought Content: Thought content normal.        Judgment: Judgment normal.         Assessment & Plan:  S/P mastectomy, bilateral  Acquired absence of both breasts  We placed injectable saline in the Expander using a sterile technique: Right: 50 cc for a total of 520 / 535 cc Left: 50 cc for a total of 520 / 535 cc

## 2018-11-14 ENCOUNTER — Encounter (HOSPITAL_BASED_OUTPATIENT_CLINIC_OR_DEPARTMENT_OTHER): Payer: Self-pay | Admitting: Anesthesiology

## 2018-11-14 LAB — CYTOLOGY - PAP: HPV: NOT DETECTED

## 2018-11-14 NOTE — Addendum Note (Signed)
Addended by: Beatrice Lecher D on: 11/14/2018 05:07 PM   Modules accepted: Orders

## 2018-11-14 NOTE — Anesthesia Preprocedure Evaluation (Addendum)
Anesthesia Evaluation  Patient identified by MRN, date of birth, ID band Patient awake    Reviewed: Allergy & Precautions, NPO status , Patient's Chart, lab work & pertinent test results  Airway Mallampati: II  TM Distance: >3 FB Neck ROM: Full    Dental no notable dental hx. (+) Teeth Intact   Pulmonary former smoker,    Pulmonary exam normal breath sounds clear to auscultation       Cardiovascular hypertension, Pt. on medications Normal cardiovascular exam Rhythm:Regular Rate:Normal     Neuro/Psych  Headaches, Anxiety  Neuromuscular disease    GI/Hepatic negative GI ROS, Neg liver ROS,   Endo/Other  Hx/o breast Ca S/P bilateral mastectomies  Renal/GU negative Renal ROS  negative genitourinary   Musculoskeletal negative musculoskeletal ROS (+)   Abdominal (+) + obese,   Peds  Hematology negative hematology ROS (+)   Anesthesia Other Findings   Reproductive/Obstetrics                            Anesthesia Physical Anesthesia Plan  ASA: II  Anesthesia Plan: General   Post-op Pain Management:    Induction: Intravenous  PONV Risk Score and Plan: Scopolamine patch - Pre-op, Midazolam, Ondansetron, Dexamethasone and Treatment may vary due to age or medical condition  Airway Management Planned: LMA  Additional Equipment:   Intra-op Plan:   Post-operative Plan: Extubation in OR  Informed Consent: I have reviewed the patients History and Physical, chart, labs and discussed the procedure including the risks, benefits and alternatives for the proposed anesthesia with the patient or authorized representative who has indicated his/her understanding and acceptance.     Dental advisory given  Plan Discussed with: CRNA and Surgeon  Anesthesia Plan Comments:        Anesthesia Quick Evaluation

## 2018-11-15 ENCOUNTER — Other Ambulatory Visit: Payer: Self-pay

## 2018-11-15 ENCOUNTER — Encounter (HOSPITAL_BASED_OUTPATIENT_CLINIC_OR_DEPARTMENT_OTHER)
Admission: RE | Disposition: A | Discharge status: Home / Self Care | Payer: Self-pay | Source: Home / Self Care | Attending: Plastic Surgery

## 2018-11-15 ENCOUNTER — Ambulatory Visit (HOSPITAL_BASED_OUTPATIENT_CLINIC_OR_DEPARTMENT_OTHER)
Admission: RE | Admit: 2018-11-15 | Discharge: 2018-11-15 | Disposition: A | Discharge status: Home / Self Care | Payer: BC Managed Care – PPO | Attending: Plastic Surgery | Admitting: Plastic Surgery

## 2018-11-15 ENCOUNTER — Ambulatory Visit (HOSPITAL_BASED_OUTPATIENT_CLINIC_OR_DEPARTMENT_OTHER): Payer: BC Managed Care – PPO | Admitting: Anesthesiology

## 2018-11-15 ENCOUNTER — Encounter (HOSPITAL_BASED_OUTPATIENT_CLINIC_OR_DEPARTMENT_OTHER): Payer: Self-pay | Admitting: *Deleted

## 2018-11-15 DIAGNOSIS — F419 Anxiety disorder, unspecified: Secondary | ICD-10-CM | POA: Insufficient documentation

## 2018-11-15 DIAGNOSIS — Z9013 Acquired absence of bilateral breasts and nipples: Secondary | ICD-10-CM | POA: Diagnosis not present

## 2018-11-15 DIAGNOSIS — Z421 Encounter for breast reconstruction following mastectomy: Secondary | ICD-10-CM | POA: Diagnosis not present

## 2018-11-15 DIAGNOSIS — Z86000 Personal history of in-situ neoplasm of breast: Secondary | ICD-10-CM | POA: Insufficient documentation

## 2018-11-15 DIAGNOSIS — Z881 Allergy status to other antibiotic agents status: Secondary | ICD-10-CM | POA: Insufficient documentation

## 2018-11-15 DIAGNOSIS — Z853 Personal history of malignant neoplasm of breast: Secondary | ICD-10-CM | POA: Diagnosis not present

## 2018-11-15 DIAGNOSIS — Z79899 Other long term (current) drug therapy: Secondary | ICD-10-CM | POA: Insufficient documentation

## 2018-11-15 DIAGNOSIS — I10 Essential (primary) hypertension: Secondary | ICD-10-CM | POA: Insufficient documentation

## 2018-11-15 DIAGNOSIS — Z888 Allergy status to other drugs, medicaments and biological substances status: Secondary | ICD-10-CM | POA: Insufficient documentation

## 2018-11-15 DIAGNOSIS — Z882 Allergy status to sulfonamides status: Secondary | ICD-10-CM | POA: Insufficient documentation

## 2018-11-15 HISTORY — PX: REMOVAL OF BILATERAL TISSUE EXPANDERS WITH PLACEMENT OF BILATERAL BREAST IMPLANTS: SHX6431

## 2018-11-15 HISTORY — PX: LIPOSUCTION: SHX10

## 2018-11-15 SURGERY — REMOVAL, TISSUE EXPANDER, BREAST, BILATERAL, WITH BILATERAL IMPLANT IMPLANT INSERTION
Anesthesia: General | Site: Breast | Laterality: Bilateral

## 2018-11-15 MED ORDER — LIDOCAINE 2% (20 MG/ML) 5 ML SYRINGE
INTRAMUSCULAR | Status: AC
  Filled 2018-11-15: qty 5

## 2018-11-15 MED ORDER — HYDROMORPHONE HCL 1 MG/ML IJ SOLN
0.2500 mg | INTRAMUSCULAR | Status: DC | PRN
  Administered 2018-11-15 (×2): 0.5 mg via INTRAVENOUS

## 2018-11-15 MED ORDER — DEXAMETHASONE SODIUM PHOSPHATE 10 MG/ML IJ SOLN
INTRAMUSCULAR | Status: AC
  Filled 2018-11-15: qty 1

## 2018-11-15 MED ORDER — ONDANSETRON HCL 4 MG/2ML IJ SOLN
INTRAMUSCULAR | Status: AC
  Filled 2018-11-15: qty 2

## 2018-11-15 MED ORDER — METOCLOPRAMIDE HCL 5 MG/ML IJ SOLN: 10.0000 mg | Freq: Once | INTRAMUSCULAR | Status: DC | PRN

## 2018-11-15 MED ORDER — BUPIVACAINE-EPINEPHRINE (PF) 0.25% -1:200000 IJ SOLN
INTRAMUSCULAR | Status: AC
  Filled 2018-11-15: qty 30

## 2018-11-15 MED ORDER — CEFAZOLIN SODIUM-DEXTROSE 2-4 GM/100ML-% IV SOLN
2.0000 g | INTRAVENOUS | Status: AC
  Administered 2018-11-15: 2 g via INTRAVENOUS

## 2018-11-15 MED ORDER — CEFAZOLIN SODIUM-DEXTROSE 2-4 GM/100ML-% IV SOLN
INTRAVENOUS | Status: AC
  Filled 2018-11-15: qty 100

## 2018-11-15 MED ORDER — OXYCODONE HCL 5 MG PO TABS: 5.0000 mg | ORAL_TABLET | Freq: Once | ORAL | Status: DC | PRN

## 2018-11-15 MED ORDER — LACTATED RINGERS IV SOLN
INTRAVENOUS | Status: DC
  Administered 2018-11-15 (×2): via INTRAVENOUS

## 2018-11-15 MED ORDER — ACETAMINOPHEN 650 MG RE SUPP: 650.0000 mg | RECTAL | Status: DC | PRN

## 2018-11-15 MED ORDER — MIDAZOLAM HCL 2 MG/2ML IJ SOLN
INTRAMUSCULAR | Status: AC
  Filled 2018-11-15: qty 2

## 2018-11-15 MED ORDER — FENTANYL CITRATE (PF) 100 MCG/2ML IJ SOLN
INTRAMUSCULAR | Status: AC
  Filled 2018-11-15: qty 2

## 2018-11-15 MED ORDER — MEPERIDINE HCL 25 MG/ML IJ SOLN: 6.2500 mg | INTRAMUSCULAR | Status: DC | PRN

## 2018-11-15 MED ORDER — ACETAMINOPHEN 325 MG PO TABS: 650.0000 mg | ORAL_TABLET | ORAL | Status: DC | PRN

## 2018-11-15 MED ORDER — HYDROMORPHONE HCL 1 MG/ML IJ SOLN
INTRAMUSCULAR | Status: AC
  Filled 2018-11-15: qty 0.5

## 2018-11-15 MED ORDER — SODIUM CHLORIDE 0.9% FLUSH: 3.0000 mL | INTRAVENOUS | Status: DC | PRN

## 2018-11-15 MED ORDER — SCOPOLAMINE 1 MG/3DAYS TD PT72: 1.0000 | MEDICATED_PATCH | Freq: Once | TRANSDERMAL | Status: DC | PRN

## 2018-11-15 MED ORDER — SODIUM CHLORIDE (PF) 0.9 % IJ SOLN
INTRAMUSCULAR | Status: DC | PRN
  Administered 2018-11-15: 20 mL

## 2018-11-15 MED ORDER — PHENYLEPHRINE 40 MCG/ML (10ML) SYRINGE FOR IV PUSH (FOR BLOOD PRESSURE SUPPORT)
PREFILLED_SYRINGE | INTRAVENOUS | Status: AC
  Filled 2018-11-15: qty 10

## 2018-11-15 MED ORDER — EPHEDRINE 5 MG/ML INJ
INTRAVENOUS | Status: AC
  Filled 2018-11-15: qty 10

## 2018-11-15 MED ORDER — SODIUM CHLORIDE 0.9% FLUSH: 3.0000 mL | Freq: Two times a day (BID) | INTRAVENOUS | Status: DC

## 2018-11-15 MED ORDER — MIDAZOLAM HCL 2 MG/2ML IJ SOLN
1.0000 mg | INTRAMUSCULAR | Status: DC | PRN
  Administered 2018-11-15: 2 mg via INTRAVENOUS

## 2018-11-15 MED ORDER — BUPIVACAINE-EPINEPHRINE (PF) 0.25% -1:200000 IJ SOLN
INTRAMUSCULAR | Status: DC | PRN
  Administered 2018-11-15: 30 mL

## 2018-11-15 MED ORDER — SCOPOLAMINE 1 MG/3DAYS TD PT72
MEDICATED_PATCH | TRANSDERMAL | Status: AC
  Filled 2018-11-15: qty 1

## 2018-11-15 MED ORDER — DEXAMETHASONE SODIUM PHOSPHATE 4 MG/ML IJ SOLN
INTRAMUSCULAR | Status: DC | PRN
  Administered 2018-11-15: 10 mg via INTRAVENOUS

## 2018-11-15 MED ORDER — SODIUM CHLORIDE 0.9 % IV SOLN
INTRAVENOUS | Status: DC | PRN
  Administered 2018-11-15: 10:00:00

## 2018-11-15 MED ORDER — LIDOCAINE HCL (CARDIAC) PF 100 MG/5ML IV SOSY
PREFILLED_SYRINGE | INTRAVENOUS | Status: DC | PRN
  Administered 2018-11-15: 50 mg via INTRAVENOUS

## 2018-11-15 MED ORDER — PROPOFOL 500 MG/50ML IV EMUL
INTRAVENOUS | Status: AC
  Filled 2018-11-15: qty 50

## 2018-11-15 MED ORDER — OXYCODONE HCL 5 MG/5ML PO SOLN: 5.0000 mg | Freq: Once | ORAL | Status: DC | PRN

## 2018-11-15 MED ORDER — SODIUM CHLORIDE 0.9 % IV SOLN: 250.0000 mL | INTRAVENOUS | Status: DC | PRN

## 2018-11-15 MED ORDER — FENTANYL CITRATE (PF) 100 MCG/2ML IJ SOLN
50.0000 ug | INTRAMUSCULAR | Status: AC | PRN
  Administered 2018-11-15: 100 ug via INTRAVENOUS
  Administered 2018-11-15 (×2): 50 ug via INTRAVENOUS

## 2018-11-15 MED ORDER — OXYCODONE HCL 5 MG PO TABS: 5.0000 mg | ORAL_TABLET | ORAL | Status: DC | PRN

## 2018-11-15 MED ORDER — SODIUM CHLORIDE (PF) 0.9 % IJ SOLN
INTRAMUSCULAR | Status: AC
  Filled 2018-11-15: qty 20

## 2018-11-15 MED ORDER — SUCCINYLCHOLINE CHLORIDE 200 MG/10ML IV SOSY
PREFILLED_SYRINGE | INTRAVENOUS | Status: AC
  Filled 2018-11-15: qty 10

## 2018-11-15 MED ORDER — ONDANSETRON HCL 4 MG/2ML IJ SOLN
INTRAMUSCULAR | Status: DC | PRN
  Administered 2018-11-15: 4 mg via INTRAVENOUS

## 2018-11-15 SURGICAL SUPPLY — 74 items
BAG DECANTER FOR FLEXI CONT (MISCELLANEOUS) ×2 IMPLANT
BINDER BREAST LRG (GAUZE/BANDAGES/DRESSINGS) IMPLANT
BINDER BREAST MEDIUM (GAUZE/BANDAGES/DRESSINGS) IMPLANT
BINDER BREAST XLRG (GAUZE/BANDAGES/DRESSINGS) ×1 IMPLANT
BINDER BREAST XXLRG (GAUZE/BANDAGES/DRESSINGS) IMPLANT
BIOPATCH RED 1 DISK 7.0 (GAUZE/BANDAGES/DRESSINGS) IMPLANT
BLADE HEX COATED 2.75 (ELECTRODE) ×2 IMPLANT
BLADE SURG 15 STRL LF DISP TIS (BLADE) ×2 IMPLANT
BLADE SURG 15 STRL SS (BLADE) ×2
BNDG GAUZE ELAST 4 BULKY (GAUZE/BANDAGES/DRESSINGS) ×4 IMPLANT
CANISTER SUCT 1200ML W/VALVE (MISCELLANEOUS) ×2 IMPLANT
CHLORAPREP W/TINT 26 (MISCELLANEOUS) ×3 IMPLANT
CORD BIPOLAR FORCEPS 12FT (ELECTRODE) IMPLANT
COVER BACK TABLE 60X90IN (DRAPES) ×2 IMPLANT
COVER MAYO STAND STRL (DRAPES) ×2 IMPLANT
COVER WAND RF STERILE (DRAPES) IMPLANT
DECANTER SPIKE VIAL GLASS SM (MISCELLANEOUS) IMPLANT
DERMABOND ADVANCED (GAUZE/BANDAGES/DRESSINGS) ×2
DERMABOND ADVANCED .7 DNX12 (GAUZE/BANDAGES/DRESSINGS) IMPLANT
DRAIN CHANNEL 19F RND (DRAIN) IMPLANT
DRAPE LAPAROSCOPIC ABDOMINAL (DRAPES) ×2 IMPLANT
DRSG PAD ABDOMINAL 8X10 ST (GAUZE/BANDAGES/DRESSINGS) ×4 IMPLANT
ELECT BLADE 4.0 EZ CLEAN MEGAD (MISCELLANEOUS) ×2
ELECT REM PT RETURN 9FT ADLT (ELECTROSURGICAL) ×2
ELECTRODE BLDE 4.0 EZ CLN MEGD (MISCELLANEOUS) ×1 IMPLANT
ELECTRODE REM PT RTRN 9FT ADLT (ELECTROSURGICAL) ×1 IMPLANT
EVACUATOR SILICONE 100CC (DRAIN) IMPLANT
GAUZE SPONGE 4X4 12PLY STRL LF (GAUZE/BANDAGES/DRESSINGS) IMPLANT
GLOVE BIO SURGEON STRL SZ 6.5 (GLOVE) ×8 IMPLANT
GLOVE BIO SURGEON STRL SZ7 (GLOVE) ×3 IMPLANT
GLOVE BIOGEL PI IND STRL 7.0 (GLOVE) IMPLANT
GLOVE BIOGEL PI IND STRL 7.5 (GLOVE) IMPLANT
GLOVE BIOGEL PI INDICATOR 7.0 (GLOVE) ×2
GLOVE BIOGEL PI INDICATOR 7.5 (GLOVE) ×1
GOWN STRL REUS W/ TWL LRG LVL3 (GOWN DISPOSABLE) ×2 IMPLANT
GOWN STRL REUS W/ TWL XL LVL3 (GOWN DISPOSABLE) IMPLANT
GOWN STRL REUS W/TWL LRG LVL3 (GOWN DISPOSABLE) ×2
GOWN STRL REUS W/TWL XL LVL3 (GOWN DISPOSABLE) ×1
IMPL GEL HP 590CC (Breast) IMPLANT
IMPLANT GEL HP 590CC (Breast) ×4 IMPLANT
IV NS 1000ML (IV SOLUTION)
IV NS 1000ML BAXH (IV SOLUTION) IMPLANT
IV NS 500ML (IV SOLUTION)
IV NS 500ML BAXH (IV SOLUTION) IMPLANT
KIT FILL SYSTEM UNIVERSAL (SET/KITS/TRAYS/PACK) IMPLANT
NDL HYPO 25X1 1.5 SAFETY (NEEDLE) IMPLANT
NDL SAFETY ECLIPSE 18X1.5 (NEEDLE) ×1 IMPLANT
NEEDLE HYPO 18GX1.5 SHARP (NEEDLE) ×1
NEEDLE HYPO 25X1 1.5 SAFETY (NEEDLE) ×2 IMPLANT
PACK BASIN DAY SURGERY FS (CUSTOM PROCEDURE TRAY) ×2 IMPLANT
PENCIL BUTTON HOLSTER BLD 10FT (ELECTRODE) ×2 IMPLANT
PIN SAFETY STERILE (MISCELLANEOUS) IMPLANT
SIZER BREAST REUSE 535CC (SIZER) ×1
SIZER BREAST REUSE 590CC (SIZER) ×2
SIZER BRST REUSE 590CC (SIZER) IMPLANT
SIZER BRST REUSE ULT HI 535CC (SIZER) IMPLANT
SLEEVE SCD COMPRESS KNEE MED (MISCELLANEOUS) ×2 IMPLANT
SPONGE LAP 18X18 RF (DISPOSABLE) ×4 IMPLANT
STRIP SUTURE WOUND CLOSURE 1/2 (SUTURE) IMPLANT
SUT MNCRL AB 4-0 PS2 18 (SUTURE) ×9 IMPLANT
SUT MON AB 3-0 SH 27 (SUTURE) ×6
SUT MON AB 3-0 SH27 (SUTURE) ×4 IMPLANT
SUT MON AB 5-0 PS2 18 (SUTURE) ×5 IMPLANT
SUT PDS AB 2-0 CT2 27 (SUTURE) IMPLANT
SUT VIC AB 3-0 SH 27 (SUTURE)
SUT VIC AB 3-0 SH 27X BRD (SUTURE) IMPLANT
SUT VICRYL 4-0 PS2 18IN ABS (SUTURE) IMPLANT
SYR BULB IRRIGATION 50ML (SYRINGE) ×2 IMPLANT
SYR CONTROL 10ML LL (SYRINGE) ×1 IMPLANT
TOWEL GREEN STERILE FF (TOWEL DISPOSABLE) ×4 IMPLANT
TUBE CONNECTING 20X1/4 (TUBING) ×2 IMPLANT
TUBING SET GRADUATE ASPIR 12FT (MISCELLANEOUS) ×1 IMPLANT
UNDERPAD 30X30 (UNDERPADS AND DIAPERS) ×4 IMPLANT
YANKAUER SUCT BULB TIP NO VENT (SUCTIONS) ×2 IMPLANT

## 2018-11-15 NOTE — Transfer of Care (Signed)
Immediate Anesthesia Transfer of Care Note  Patient: Shannon May  Procedure(s) Performed: removal of bilateral expanders and placement of bilateral silicone implants. (Bilateral Breast) LIPOSUCTION (Bilateral Breast)  Patient Location: PACU  Anesthesia Type:General  Level of Consciousness: awake, alert , oriented and drowsy  Airway & Oxygen Therapy: Patient Spontanous Breathing and Patient connected to face mask oxygen  Post-op Assessment: Report given to RN and Post -op Vital signs reviewed and stable  Post vital signs: Reviewed and stable  Last Vitals:  Vitals Value Taken Time  BP    Temp    Pulse 94 11/15/2018 11:38 AM  Resp 18 11/15/2018 11:38 AM  SpO2 100 % 11/15/2018 11:38 AM  Vitals shown include unvalidated device data.  Last Pain:  Vitals:   11/15/18 0727  TempSrc: Oral  PainSc: 0-No pain         Complications: No apparent anesthesia complications

## 2018-11-15 NOTE — Discharge Instructions (Addendum)
INSTRUCTIONS FOR AFTER BREAST SURGERY   The following information will help you and your family understand what to expect when you are discharged from the hospital.  Following these guidelines will help ensure a smooth recovery and reduce risks of complications.   Postoperative instructions include information on: diet, wound care, medications and physical activity.  AFTER SURGERY Expect to go home after the procedure.  In some cases, you may need to spend one night in the hospital for observation.  DIET Breast surgery does not require a specific diet.  However, I have to mention that the healthier you eat the better your body can start healing. It is important to increasing your protein intake.  This means limiting the foods with sugar and carbohydrates.  Focus on vegetables and some meat.  If you have any liposuction during your procedure be sure to drink water.  If your urine is bright yellow, then it is concentrated, and you need to drink more water.  As a general rule after surgery, you should have 8 ounces of water every hour while awake.  If you find you are persistently nauseated or unable to take in liquids let us know.  NO TOBACCO USE or EXPOSURE.  This will slow your healing process and increase the risk of a wound.  WOUND CARE You can shower the day after surgery if you don't have a drain.  Use fragrance free soap.  Dial, [place] and [place] are usually mild on the skin. If you have a drain clean with baby wipes until the drain is removed.  If you have steri-strips / tape directly attached to your skin leave them in place. It is OK to get these wet.  No baths, pools or hot tubs for two weeks. We close your incision to leave the smallest and best-looking scar. No ointment or creams on your incisions until given the go ahead.  Especially not Neosporin (Too many skin reactions with this one).  A few weeks after surgery you can use Mederma and start massaging the scar. We ask you to wear your  binder or sports bra for the first 6 weeks around the clock, including while sleeping. This provides added comfort and helps reduce the fluid accumulation at the surgery site.  ACTIVITY No heavy lifting until cleared by the doctor.  This usually means no more than a half-gallon of milk.  It is OK to walk and climb stairs. In fact, moving your legs is very important to decrease your risk of a blood clot.  It will also help keep you from getting deconditioned.  Every 1 to 2 hours get up and walk for 5 minutes. This will help with a quicker recovery back to normal.  Let pain be your guide so you don't do too much.  NO, you cannot do the spring cleaning and don't plan on taking care of anyone else.  This is your time for TLC.  You will be more comfortable if you sleep and rest with your head elevated either with a few pillows under you or in a recliner.  No stomach sleeping for a few months.  WORK Everyone returns to work at different times. As a rough guide, most people take at least 1 - 2 weeks off prior to returning to work. If you need documentation for your job, bring the forms to your postoperative follow up visit.  DRIVING Arrange for someone to bring you home from the hospital.  You may be able to drive a few  days after surgery but not while taking any narcotics or valium.  BOWEL MOVEMENTS Constipation can occur after anesthesia and while taking pain medication.  It is important to stay ahead for your comfort.  We recommend taking Milk of Magnesia (2 tablespoons; twice a day) while taking the pain pills.  SEROMA This is fluid your body tried to put in the surgical site.  This is normal but if it creates tight skinny skin let us know.  It usually decreases in a few weeks.  WHEN TO CALL Call your surgeon's office if any of the following occur:  Fever 101 degrees F or greater  Excessive bleeding or fluid from the incision site.  Pain that increases over time without aid from the  medications  Redness, warmth, or pus draining from incision sites  Persistent nausea or inability to take in liquids  Severe misshapen area that underwent the operation.  Here are some resources:  1. Plastic surgery website: https://www.plasticsurgery.org/for-medical-professionals/education-and-resources/publications/breast-reconstruction-magazine 2. Breast Reconstruction Awareness Campaign:  HotelLives.co.nz 3. Plastic surgery Implant information:  https://www.plasticsurgery.org/patient-safety/breast-implant-safety       Post Anesthesia Home Care Instructions  Activity: Get plenty of rest for the remainder of the day. A responsible individual must stay with you for 24 hours following the procedure.  For the next 24 hours, DO NOT: -Drive a car -Paediatric nurse -Drink alcoholic beverages -Take any medication unless instructed by your physician -Make any legal decisions or sign important papers.  Meals: Start with liquid foods such as gelatin or soup. Progress to regular foods as tolerated. Avoid greasy, spicy, heavy foods. If nausea and/or vomiting occur, drink only clear liquids until the nausea and/or vomiting subsides. Call your physician if vomiting continues.  Special Instructions/Symptoms: Your throat may feel dry or sore from the anesthesia or the breathing tube placed in your throat during surgery. If this causes discomfort, gargle with warm salt water. The discomfort should disappear within 24 hours.  If you had a scopolamine patch placed behind your ear for the management of post- operative nausea and/or vomiting:  1. The medication in the patch is effective for 72 hours, after which it should be removed.  Wrap patch in a tissue and discard in the trash. Wash hands thoroughly with soap and water. 2. You may remove the patch earlier than 72 hours if you experience unpleasant side effects which may include dry mouth, dizziness or visual disturbances. 3.  Avoid touching the patch. Wash your hands with soap and water after contact with the patch.

## 2018-11-15 NOTE — Op Note (Signed)
Op report Bilateral Exchange   DATE OF OPERATION: 11/15/2018  LOCATION: [facility]  SURGICAL DIVISION: Plastic Surgery  PREOPERATIVE DIAGNOSES:  1.History of breast cancer.  2. Acquired absence of bilateral breast.   POSTOPERATIVE DIAGNOSES:  1. History of breast cancer.  2. Acquired absence of bilateral breast.   PROCEDURE:  1. Bilateral exchange of tissue expanders for implants.  2. Bilateral capsulotomies for implant respositioning.  SURGEON: Mckinsey Keagle Sanger Taraneh Metheney, DO  ASSISTANT: [place], RNFA  ANESTHESIA:  General.   COMPLICATIONS: None.   IMPLANTS: Left - Mentor Smooth Round High Profile Gel 590 cc. Ref #[id number].  Serial Number [id number] Right - Mentor Smooth Round High Profile Gel 590 cc. Ref #[id number].  Serial Number [id number]  INDICATIONS FOR PROCEDURE:  The patient, Shannon May, is a 48 y.o. female born on 1971/07/06, is here for treatment after bilateral mastectomies.  She had tissue expanders placed at the time of mastectomies. She now presents for exchange of her expanders for implants.  She requires capsulotomies to better position the implants. MRN: [id number]  CONSENT:  Informed consent was obtained directly from the patient. Risks, benefits and alternatives were fully discussed. Specific risks including but not limited to bleeding, infection, hematoma, seroma, scarring, pain, implant infection, implant extrusion, capsular contracture, asymmetry, wound healing problems, and need for further surgery were all discussed. The patient did have an ample opportunity to have her questions answered to her satisfaction.   DESCRIPTION OF PROCEDURE:  The patient was taken to the operating room. SCDs were placed and IV antibiotics were given. The patient's chest was prepped and draped in a sterile fashion. A time out was performed and the implants to be used were identified.  Tumescent was placed in the medial and lateral most portion of  both breasts.  Liposuction was performed to improve the contour medially and laterally.  On the right breast: One percent Lidocaine with epinephrine was used to infiltrate at the incision site. The old mastectomy scar was excised.  The mastectomy flaps from the superior and inferior flaps were raised over the pectoralis major muscle for several centimeters to minimize tension for the closure. The pectoralis was split inferior to the skin incision to expose and remove the tissue expander.  Inspection of the pocket showed a normal healthy capsule and good integration of the biologic matrix.  The pocket was irrigated with antibiotic solution.   Circumferential capsulotomies were performed to allow for breast pocket expansion.  Measurements were made and a sizer used to confirm adequate pocket size for the implant dimensions.  Hemostasis was ensured with electrocautery. New gloves were placed. The implant was soaked in antibiotic solution and then placed in the pocket and oriented appropriately. The pectoralis major muscle and capsule on the anterior surface were re-closed with a 3-0 Monocryl suture. The remaining skin was closed with 4-0 Monocryl deep dermal. There was excess skin both medially and laterally.  This was excised and sent to path.  The remaining deep layers were closed with the 4-0 Monocryl followed by the 5-0 Monocryl subcuticular stitches.   On the left breast: The old mastectomy scar was excised.  The mastectomy flaps from the superior and inferior flaps were raised over the pectoralis major muscle for several centimeters to minimize tension for the closure. The pectoralis was split inferior to the skin incision to expose and remove the tissue expander.  Inspection of the pocket showed a normal healthy capsule and good integration of the biologic matrix.   Circumferential  capsulotomies were performed to allow for breast pocket expansion.  Measurements were made and a sizer utilized to confirm  adequate pocket size for the implant dimensions.  Hemostasis was ensured with the electrocautery.  New gloves were applied. The implant was soaked in antibiotic solution and placed in the pocket and oriented appropriately. The pectoralis major muscle and capsule on the anterior surface were re-closed with a 3-0 Monocryl suture. The remaining skin was closed with 4-0 Monocryl deep dermal. There was excess skin both medially and laterally.  This was excised and sent to path.  The remaining deep layers were closed with the 4-0 Monocryl followed by the 5-0 Monocryl subcuticular stitches.  Dermabond was applied to the incision site. A breast binder and ABDs were placed.  The patient was allowed to wake from anesthesia and taken to the recovery room in satisfactory condition.   The RNFA assisted throughout the case.  The RNFA was essential in retraction and counter traction when needed to make the case progress smoothly.  This retraction and assistance made it possible to see the tissue plans for the procedure.  The assistance was needed for blood control, tissue re-approximation and assisted with closure of the incision site.

## 2018-11-15 NOTE — Anesthesia Postprocedure Evaluation (Signed)
Anesthesia Post Note  Patient: Shannon May  Procedure(s) Performed: removal of bilateral expanders and placement of bilateral silicone implants. (Bilateral Breast) LIPOSUCTION (Bilateral Breast)     Patient location during evaluation: PACU Anesthesia Type: General Level of consciousness: awake and alert Pain management: pain level controlled Vital Signs Assessment: post-procedure vital signs reviewed and stable Respiratory status: spontaneous breathing, nonlabored ventilation, respiratory function stable and patient connected to nasal cannula oxygen Cardiovascular status: blood pressure returned to baseline and stable Postop Assessment: no apparent nausea or vomiting Anesthetic complications: no    Last Vitals:  Vitals:   11/15/18 1250 11/15/18 1345  BP: 125/72 125/72  Pulse: 79 79  Resp: 18 18  Temp: 36.6 C 36.6 C  SpO2:  98%    Last Pain:  Vitals:   11/15/18 1345  TempSrc:   PainSc: 4                  Montez Hageman

## 2018-11-15 NOTE — Anesthesia Procedure Notes (Signed)
Procedure Name: LMA Insertion Date/Time: 11/15/2018 8:39 AM Performed by: Willa Frater, CRNA Pre-anesthesia Checklist: Patient identified, Emergency Drugs available, Suction available and Patient being monitored Patient Re-evaluated:Patient Re-evaluated prior to induction Oxygen Delivery Method: Circle system utilized Preoxygenation: Pre-oxygenation with 100% oxygen Induction Type: IV induction Ventilation: Mask ventilation without difficulty LMA: LMA inserted LMA Size: 4.0 Number of attempts: 1 Airway Equipment and Method: Bite block Placement Confirmation: positive ETCO2 Tube secured with: Tape Dental Injury: Teeth and Oropharynx as per pre-operative assessment

## 2018-11-15 NOTE — Interval H&P Note (Signed)
History and Physical Interval Note:  11/15/2018 8:14 AM  Shannon May  has presented today for surgery, with the diagnosis of status post bilateral mastectomy, Acquired absence of both breasts.  The various methods of treatment have been discussed with the patient and family. After consideration of risks, benefits and other options for treatment, the patient has consented to  Procedure(s): removal of bilateral expanders and placement of bilateral silicone implants. (Bilateral) as a surgical intervention.  The patient's history has been reviewed, patient examined, no change in status, stable for surgery.  I have reviewed the patient's chart and labs.  Questions were answered to the patient's satisfaction.  520 cc on each side is current volume.  Loel Lofty Ladislao Cohenour

## 2018-11-17 ENCOUNTER — Encounter (HOSPITAL_BASED_OUTPATIENT_CLINIC_OR_DEPARTMENT_OTHER): Payer: Self-pay | Admitting: Plastic Surgery

## 2018-11-20 ENCOUNTER — Encounter: Payer: BC Managed Care – PPO | Admitting: Obstetrics & Gynecology

## 2018-11-20 ENCOUNTER — Other Ambulatory Visit: Payer: Self-pay | Admitting: Family Medicine

## 2018-11-20 MED ORDER — ALPRAZOLAM 0.5 MG PO TABS: 0.5000 mg | ORAL_TABLET | Freq: Every day | ORAL | 0 refills | Status: DC | PRN

## 2018-11-20 NOTE — Telephone Encounter (Signed)
Pt called requesting refill on xanax. States she just had surgery for breast cancer, and has upcoming appt for follow up on abnormal pap. States with all that and the coronavirus outbreak she is very stressed. Will route.

## 2018-11-22 ENCOUNTER — Telehealth: Payer: Self-pay | Admitting: *Deleted

## 2018-11-22 NOTE — Telephone Encounter (Signed)
Left patient a message from a message that she left at 3:34pm wanting to reschedule a [place] for 11/24/18. No appointments available. Patient can call for another day.

## 2018-11-24 ENCOUNTER — Telehealth: Payer: Self-pay

## 2018-11-24 ENCOUNTER — Encounter: Payer: Self-pay | Admitting: Plastic Surgery

## 2018-11-24 ENCOUNTER — Other Ambulatory Visit: Payer: Self-pay

## 2018-11-24 ENCOUNTER — Ambulatory Visit (INDEPENDENT_AMBULATORY_CARE_PROVIDER_SITE_OTHER): Payer: BC Managed Care – PPO | Admitting: Plastic Surgery

## 2018-11-24 VITALS — BP 134/85 | HR 91 | Temp 98.0°F | Ht 68.0 in | Wt 198.2 lb

## 2018-11-24 DIAGNOSIS — Z9013 Acquired absence of bilateral breasts and nipples: Secondary | ICD-10-CM

## 2018-11-24 MED ORDER — ALPRAZOLAM 0.25 MG PO TABS: 0.2500 mg | ORAL_TABLET | Freq: Two times a day (BID) | ORAL | 0 refills | Status: DC | PRN

## 2018-11-24 NOTE — Telephone Encounter (Signed)
Per Dr. Marla Roe- call to [place] @ ph [phone]- left v/m requesting call to pt for further care   [facility]

## 2018-11-24 NOTE — Progress Notes (Signed)
   Subjective:    Patient ID: Shannon May, female    DOB: [birth year], 48 y.o.   MRN: [id number]  The patient is a 48 year old female here for follow-up after undergoing bilateral breast implant placement for exchange from expanders.  She has some bruising as expected.  There is also some swelling.  Incisions healing well.  No sign of infection. She has not had a fever and is overall doing well and walking daily on the tread mill.   Review of Systems  Constitutional: Negative.   HENT: Negative.   Respiratory: Negative.   Cardiovascular: Negative.   Gastrointestinal: Negative.   Musculoskeletal: Negative.   Psychiatric/Behavioral: Negative.        Objective:   Physical Exam Vitals signs and nursing note reviewed.  Constitutional:      Appearance: Normal appearance.  HENT:     Head: Normocephalic and atraumatic.  Cardiovascular:     Rate and Rhythm: Normal rate.  Pulmonary:     Effort: Pulmonary effort is normal.  Abdominal:     General: Abdomen is flat.  Neurological:     General: No focal deficit present.     Mental Status: She is alert.  Psychiatric:        Mood and Affect: Mood normal.        Thought Content: Thought content normal.        Assessment & Plan:  S/P mastectomy, bilateral  Acquired absence of both breasts   Continue with light massage to the firm areas.  May shower.  Continue with breast binder or sports bra.  No lifting over 5 pounds.  Range of motion is fine. I will call in Xanax which she can take 1/2 pill per night.  I have encouraged her to talk to her PCP about the increase in anxiety and depression.  We will look into the [employer] support groups.   Follow up in 3 weeks.

## 2018-12-01 ENCOUNTER — Encounter: Payer: Self-pay | Admitting: Plastic Surgery

## 2018-12-14 ENCOUNTER — Telehealth (INDEPENDENT_AMBULATORY_CARE_PROVIDER_SITE_OTHER): Payer: BC Managed Care – PPO | Admitting: Family Medicine

## 2018-12-14 ENCOUNTER — Encounter: Payer: Self-pay | Admitting: Family Medicine

## 2018-12-14 VITALS — Temp 96.6°F | Ht 68.0 in | Wt 195.0 lb

## 2018-12-14 DIAGNOSIS — F419 Anxiety disorder, unspecified: Secondary | ICD-10-CM | POA: Diagnosis not present

## 2018-12-14 DIAGNOSIS — I10 Essential (primary) hypertension: Secondary | ICD-10-CM | POA: Diagnosis not present

## 2018-12-14 DIAGNOSIS — R0789 Other chest pain: Secondary | ICD-10-CM

## 2018-12-14 MED ORDER — METOPROLOL SUCCINATE ER 25 MG PO TB24: 25.0000 mg | ORAL_TABLET | Freq: Every day | ORAL | 2 refills | Status: DC

## 2018-12-14 MED ORDER — ALPRAZOLAM 0.5 MG PO TABS: 0.5000 mg | ORAL_TABLET | Freq: Every day | ORAL | 0 refills | Status: DC | PRN

## 2018-12-14 NOTE — Progress Notes (Signed)
Virtual Visit via Video Note  I connected with Shannon May on 12/14/18 at  3:20 PM EDT by a video enabled telemedicine application and verified that I am speaking with the correct person using two identifiers.   I discussed the limitations of evaluation and management by telemedicine and the availability of in person appointments. The patient expressed understanding and agreed to proceed.  Pt at home and provider in the office for visit.   Subjective:    CC: Bp has been high and chest pain  HPI:  Spoke w/pt and she informed me that she hadn't been feeling well and she had started taking her bp and her readings were all over the place:  167/85 3hr 164/89,122/74.  Says a few months ago BP was well controlled on the low dose HCTZ.     + fam hx of HTN in her sister and mother. She has lost a ton of weight so not sure why her BP was be up all the sudden. She does feel like some of this is anxiety driven with everything going on with COVID-19.  Marland Kitchen She has a lot of stress.  Has had more HA over the past 3-4 weeks.  Some occ dizziness with it. No swelling.  No SOB. No cough. Has had some seasonal allergies.   She reports that her heart rate has been up and down. Highest 83 and lowest 70.  Normally runs in the 50-60s.    She did have an episode of CP and headache as well.  It was on the L side at the bottom half of breast. It was in her back and radiated around to her and she said that she go up and walked and passed some gas and belched and felt much better. It lasted about 15 minutes.  She was worried about blood clot form surgery but she is 4 weeks out and she is very active, walking and cooking for family members.   appt next week w/surgeon.     Past medical history, Surgical history, Family history not pertinant except as noted below, Social history, Allergies, and medications have been entered into the medical record, reviewed, and corrections made.   Review of Systems: No fevers,  chills, night sweats, weight loss, chest pain, or shortness of breath.   Objective:    General: Speaking clearly in complete sentences without any shortness of breath.  Alert and oriented x3.  Normal judgment. No apparent acute distress.  isitting in her bed at home.    Impression and Recommendations:    Atypical chest pain - resolved after 15 min. Not persistant. Likely GI related. Unlikely to be a PE but recurs recommend further workup.    HTN - uncontrolled currently.  will start metoprolol er . Will start with half a tab. Watching her salt. Continue HCTZ low dose for now.  Work on stress reduction.  Continue to exercise.  Monitor for changes. F/U in 2 weeks. Will see if feel better in regards to HA, etc once BP is down. She will continue to track at home.    Anxiety - RF xanax. Reminded her to use sparingly.   I discussed the assessment and treatment plan with the patient. The patient was provided an opportunity to ask questions and all were answered. The patient agreed with the plan and demonstrated an understanding of the instructions.   The patient was advised to call back or seek an in-person evaluation if the symptoms worsen or if the condition fails  to improve as anticipated.   Beatrice Lecher, MD

## 2018-12-14 NOTE — Progress Notes (Signed)
Spoke w/pt and she informed me that she hadn't been feeling well and she had started taking her bp and her readings were all over the place as high as 167/85, and after 3 hours 122/74 she expressed that she felt that she was having a panic attack.  She also reports that her heart rate has been up and down.   She did have a headache. She took tylenlol for this. She mention that she had some gas that started out in the L lower side of her back and radiated around to the bottom half of her L breast. She said that she said that she got up and walked and passed some gas and took an antiacid and belched which made her feel better so she felt that this was only gas.   She does feel that a lot of her sxs are coming from her anxiety. I recommended that she wait a few minutes and check her bp and let pcp know what that reading was.Shannon May, Lahoma Crocker, CMA

## 2018-12-21 ENCOUNTER — Telehealth: Payer: Self-pay | Admitting: Plastic Surgery

## 2018-12-21 NOTE — Telephone Encounter (Signed)
Called patient to confirm appointment scheduled for tomorrow. Patient answered the following questions: °1.Has the patient traveled outside of the state of [place] at all within the past 6 weeks? No °2.Does the patient have a fever or cough at all? No °3.Has the patient been tested for COVID? Had a positive COVID test? No °4. Has the patient been in contact with anyone who has tested positive? No ° °

## 2018-12-22 ENCOUNTER — Ambulatory Visit (INDEPENDENT_AMBULATORY_CARE_PROVIDER_SITE_OTHER): Payer: BC Managed Care – PPO | Admitting: Plastic Surgery

## 2018-12-22 ENCOUNTER — Encounter: Payer: Self-pay | Admitting: Plastic Surgery

## 2018-12-22 ENCOUNTER — Telehealth: Payer: Self-pay | Admitting: Adult Health

## 2018-12-22 ENCOUNTER — Other Ambulatory Visit: Payer: Self-pay

## 2018-12-22 VITALS — BP 131/84 | HR 73 | Temp 97.8°F | Wt 194.0 lb

## 2018-12-22 DIAGNOSIS — Z9889 Other specified postprocedural states: Secondary | ICD-10-CM

## 2018-12-22 DIAGNOSIS — Z9013 Acquired absence of bilateral breasts and nipples: Secondary | ICD-10-CM

## 2018-12-22 NOTE — Telephone Encounter (Signed)
R/s appt per 4/17 sch message - pt aware 5/6 appt is webex

## 2018-12-22 NOTE — Progress Notes (Signed)
   Subjective:    Patient ID: Shannon May, female    DOB: Mar 16, 1971, 48 y.o.   MRN: [id number]  The patient is a 48 year old female here for follow-up after undergoing bilateral breast reconstruction.  She had her implants placed.  She was feeling a little bit depressed on her last visit.  She was able to talk with her primary physician and overall is doing much better.  Her pain is markedly improved and she is increasing her activity.  Her incisions are healing nicely.  There is no sign of infection.  She still has some swelling.  I suspect this will take another few weeks to resolve in the lower pole of both breasts especially medially.   Review of Systems  Constitutional: Negative.   HENT: Negative.   Eyes: Negative.   Respiratory: Negative.   Cardiovascular: Negative.   Gastrointestinal: Negative.   Genitourinary: Negative.   Musculoskeletal: Negative.   Hematological: Negative.   Psychiatric/Behavioral: Negative.        Objective:   Physical Exam Vitals signs and nursing note reviewed.  Constitutional:      Appearance: Normal appearance.  HENT:     Head: Normocephalic.  Cardiovascular:     Rate and Rhythm: Normal rate.  Pulmonary:     Effort: Pulmonary effort is normal.  Neurological:     Mental Status: She is alert and oriented to person, place, and time.  Psychiatric:        Mood and Affect: Mood normal.        Behavior: Behavior normal.       Assessment & Plan:  S/P mastectomy, bilateral  Acquired absence of both breasts  S/P breast reconstruction, bilateral Continue with light massage.  May increase activity as tolerated.  Follow-up in a few months for reevaluation.  She may be interested in nipple areola tattoo at that time.

## 2018-12-27 ENCOUNTER — Telehealth: Payer: Self-pay

## 2018-12-27 ENCOUNTER — Encounter: Payer: Self-pay | Admitting: Adult Health

## 2018-12-27 NOTE — Telephone Encounter (Signed)
Thanks Nicole

## 2018-12-27 NOTE — Telephone Encounter (Signed)
[facility] to request a copy of pt's genetic test results performed in Oct.   [facility] representative states that they would need consent from the pt to release records and yes, pt can call them to give that consent. Pt informed of above and given number to call. Pt states she has a copy of these results and will also upload into MyChart.

## 2018-12-28 ENCOUNTER — Encounter: Payer: Self-pay | Admitting: Family Medicine

## 2018-12-28 ENCOUNTER — Telehealth (INDEPENDENT_AMBULATORY_CARE_PROVIDER_SITE_OTHER): Payer: BC Managed Care – PPO | Admitting: Family Medicine

## 2018-12-28 VITALS — BP 140/79 | HR 72 | Temp 97.4°F | Ht 68.0 in | Wt 192.0 lb

## 2018-12-28 DIAGNOSIS — F439 Reaction to severe stress, unspecified: Secondary | ICD-10-CM | POA: Diagnosis not present

## 2018-12-28 DIAGNOSIS — I10 Essential (primary) hypertension: Secondary | ICD-10-CM

## 2018-12-28 NOTE — Progress Notes (Signed)
Pt reports that her bp runs lower in the evening.  4/20: 133/80 4/21: 141/83 4/22: 145/82  PM readings 4/20: 110/69 4/21: 116/75 4/22: 120/73 Her HR has been in the 60's.  No side effects w/the medication. She did notice that when she stood up to fast she got dizzy but nothing since then.  She has been walking more. And has not found the need to used her Xanax!! :D F/U visit with plastic surgeon went well.    Maryruth Eve, Lahoma Crocker, CMA

## 2018-12-28 NOTE — Progress Notes (Signed)
Virtual Visit via Video Note  I connected with Shannon May on 12/28/18 at  3:20 PM EDT by a video enabled telemedicine application and verified that I am speaking with the correct person using two identifiers.   I discussed the limitations of evaluation and management by telemedicine and the availability of in person appointments. The patient expressed understanding and agreed to proceed.  Pt was at home for the visit and I was in home office.    Subjective:    CC: f/U BP HPI:  Hypertension-today is a 3-week follow-up for blood pressure.  I did a virtual visit with her on April 9 and at that time she was having significantly elevated blood pressure readings mostly in the 160s but also had a couple of normal blood pressures as well.  She had a very strong family history of hypertension in her sister and mother.  She had also lost some weight recently so was confused for why her blood pressure would suddenly be elevated but she is also been under a lot of stress and been having some more headaches than usual as well as feeling dizzy.  She also had experienced some atypical chest pain that resolved after about 15 minutes.  We decided to start her on metoprolol ER she was also experiencing some elevated heart rates.  She was to start with a half of a tab and then also continue with her low-dose HCTZ for now and work on stress reduction and trying to exercise.  Pt reports that her bp runs lower in the evening.  4/20: 133/80 4/21: 141/83 4/22: 145/82  PM readings 4/20: 110/69 4/21: 116/75 4/22: 120/73 Her HR has been in the 60's.  No side effects w/the medication. She did notice that when she stood up to fast she got dizzy but nothing since then. She has been walking daily.  The HA have resolved over the last weeks.  Did have one daywhen felt a little dizzy just one time when she got off the cough really quickly.  She is seeing counselor on Monday.   Also refilled her Xanax at that time  for anxiety.she hasn't had to use any of her xanax.     Past medical history, Surgical history, Family history not pertinant except as noted below, Social history, Allergies, and medications have been entered into the medical record, reviewed, and corrections made.   Review of Systems: No fevers, chills, night sweats, weight loss, chest pain, or shortness of breath.   Objective:    General: Speaking clearly in complete sentences without any shortness of breath.  Alert and oriented x3.  Normal judgment. No apparent acute distress.  Well groomed.      Impression and Recommendations:    HTN - Increase toprol to 1 tab daily. She was to take 1/2 tab BID.  That is fine. She will try fo r2 weeks. Stop if pulse less than 50 or SBP less than 110.  We could also consider changing to ACE plus diuretic. We initially start with BBlocker bc of concerns with ACEi and COVID. There may not be a correlatin anymore.  F/U in 2 weeks.    Acute stresss- will start theapy next week with Janett Billow.  Not  Needed her xanax.        I discussed the assessment and treatment plan with the patient. The patient was provided an opportunity to ask questions and all were answered. The patient agreed with the plan and demonstrated an understanding of the instructions.  The patient was advised to call back or seek an in-person evaluation if the symptoms worsen or if the condition fails to improve as anticipated.   Beatrice Lecher, MD

## 2019-01-09 ENCOUNTER — Telehealth (INDEPENDENT_AMBULATORY_CARE_PROVIDER_SITE_OTHER): Payer: BC Managed Care – PPO | Admitting: Family Medicine

## 2019-01-09 ENCOUNTER — Encounter: Payer: Self-pay | Admitting: Family Medicine

## 2019-01-09 ENCOUNTER — Encounter: Payer: BC Managed Care – PPO | Admitting: Adult Health

## 2019-01-09 ENCOUNTER — Telehealth: Payer: Self-pay | Admitting: Hematology and Oncology

## 2019-01-09 VITALS — BP 115/69 | HR 62 | Temp 98.6°F | Ht 68.0 in

## 2019-01-09 DIAGNOSIS — I10 Essential (primary) hypertension: Secondary | ICD-10-CM | POA: Diagnosis not present

## 2019-01-09 DIAGNOSIS — N907 Vulvar cyst: Secondary | ICD-10-CM

## 2019-01-09 DIAGNOSIS — F439 Reaction to severe stress, unspecified: Secondary | ICD-10-CM

## 2019-01-09 MED ORDER — HYDROCHLOROTHIAZIDE 12.5 MG PO CAPS: ORAL_CAPSULE | ORAL | 3 refills | Status: DC

## 2019-01-09 NOTE — Progress Notes (Signed)
Virtual Visit via Video Note  I connected with Shannon May on 01/09/19 at  8:50 AM EDT by a video enabled telemedicine application and verified that I am speaking with the correct person using two identifiers.   I discussed the limitations of evaluation and management by telemedicine and the availability of in person appointments. The patient expressed understanding and agreed to proceed.  Pt was at home and I was in my office for the virtual visit.     Subjective:    CC: F/U BP   HPI:  3-week follow-up for elevated blood pressure-when I had a visit with her about 3 weeks ago she had complained that she just was not feeling well she even had an episode of chest pain.  So she started monitoring her blood pressure and says that they were really all over the place but multiple pressures were significantly elevated.  She was also having episodes of feeling like her heart was racing at times.  Thus we decided go ahead and start her on metoprolol. Has had a copule of episodes of feeling a little dizzy when got up quickly. Lowest HR she has seen is 55.  No more CP episodes. No HA since BP has been better.   Meet with center behavioral health and then started on Lexapro. Has been doing ok on half a tab.  Tried a whole and had diarrhea. She has an appt with Janett Billow for counseling later this week.    Noticed a bump in her vagina, more on the inner labial area about 2 days ago.  Yesterday t burst.  It drained some pus.  Says it feels smaller today, but it is still a little sore.  She wanted to know she could put Neosporin on it she is never had 1 of these before..   Past medical history, Surgical history, Family history not pertinant except as noted below, Social history, Allergies, and medications have been entered into the medical record, reviewed, and corrections made.   Review of Systems: No fevers, chills, night sweats, weight loss, chest pain, or shortness of breath.   Objective:     General: Speaking clearly in complete sentences without any shortness of breath.  Alert and oriented x3.  Normal judgment. No apparent acute distress.  Well-groomed.   Impression and Recommendations:   Anxiety  - she is currently on half of 10 days. She didn't tolerate the full 10day. She feels ike she has noticed improvement.   Hypertension- BP looks great.  Still walking daily.  Couple of dizzy speels.  She wants to continue with her current regimen for now with the metoprolol.  She is hopeful that getting her anxiety and stress under control will really help her blood pressure as well.  We discussed that it would be reasonable when she is with steady state with the Lexapro maybe in a month to consider holding the metoprolol at that time to see if maybe her anxiety was what was really driving her blood pressure up.  Vaginal cyst - she is feeling better as it did drain some.  Since it seems to be draining on its own and it is a little smaller just encouraged her to keep an eye on it.  Consider sebaceous cyst versus epidermal cyst versus ingrown hair.  If she wants to use Neosporin she certainly can but did not use it for more than 3 consecutive days.  If it is getting larger and not resolving then please let us know.  We could also consider a trial of antibiotics.  Though consider that it could be a nabothian cyst and may require incision and drainage.  She actually has an appointment in 2 weeks with her OB/GYN.   I discussed the assessment and treatment plan with the patient. The patient was provided an opportunity to ask questions and all were answered. The patient agreed with the plan and demonstrated an understanding of the instructions.   The patient was advised to call back or seek an in-person evaluation if the symptoms worsen or if the condition fails to improve as anticipated.   Beatrice Lecher, MD

## 2019-01-09 NOTE — Progress Notes (Signed)
SURVIVORSHIP VIRTUAL VISIT:  I connected with Shannon May on 01/09/19 at 11:00 AM EDT by [facility] and verified that I am speaking with the correct person using two identifiers.   I discussed the limitations, risks, security and privacy concerns of performing an evaluation and management service by [facility] and the availability of in person appointments. I also discussed with the patient that there may be a patient responsible charge related to this service. The patient expressed understanding and agreed to proceed.   BRIEF ONCOLOGIC HISTORY:    Malignant neoplasm of upper-outer quadrant of left breast in female, estrogen receptor positive ([place])   05/31/2018 Initial Diagnosis    Screening mammogram detected left breast calcifications measuring 4.5 cm in the UOQ left breast.  Biopsy revealed low-grade DCIS.  That was ER PR 100% positive.  Tis NX stage 0      06/08/2018 Breast MRI    Segmental non-mass enhancement over the left UOQ, spanning 3.1 x 7.8 x 2.3 cm. There is a separate 2.4 cm focus of linear non-mass enhancement over the left LOQ. There is also a subtle patchy non-mass enhancement over the right UOQ with most suspicious focus measuring 2.1 cm. No axillary adenopathy.    06/16/2018 Genetic Testing    Negative.  Genes tested include: The [facility] gene panel offered by [employer] includes sequencing and deletion/duplication testing of the following 35 genes: APC, ATM, AXIN2, BARD1, BMPR1A, BRCA1, BRCA2, BRIP1, CHD1, CDK4, CDKN2A, CHEK2, EPCAM (large rearrangement only), HOXB13, (sequencing only), GALNT12, MLH1, MSH2, MSH3 (excluding repetitive portions of exon 1), MSH6, MUTYH, NBN, NTHL1, PALB2, PMS2, PTEN, RAD51C, RAD51D, RNF43, RPS20, SMAD4, STK11, and TP53. Sequencing was performed for select regions of POLE and POLD1, and large rearrangement analysis was performed for select regions of GREM1.    08/14/2018 Surgery    Left mastectomy: DCIS low-grade, 6 cm, margins negative, 0/3  lymph nodes negative ER 100%, PR 100%, Tis N0 stage 0, right mastectomy prophylactic: [place]    11/15/2018 Surgery    Bilateral silicone implant placement     INTERVAL HISTORY:  Shannon May to review her survivorship care plan detailing her treatment course for breast cancer, as well as monitoring long-term side effects of that treatment, education regarding health maintenance, screening, and overall wellness and health promotion.     Overall, Shannon May reports feeling quite well.  She says that she has been doing well.  She has some left arm pain, she notes some discomfort in her left elbow.  She was evaluated by Dr. Marla Roe and was given massage recommendations.  She is stretching, lifting weights with cans of vegetables.  She is also walking more and eating better.  She notes that she has lost 27 pounds since her diagnosis.    [place] is up to date with her cancer screening.  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  She also notes that she has had a few colonoscopies due to her h/o diverticulitis.  She takes probiotics, stool softeners for this and manages it well.    Shannon May has also noted a significant increase in her anxiety.  It all started when she was diagnosed with breast cancer, however it was relieved with an occasional Xanax.  Once COVID-19 started, she became more anxious and was started on lexapro.  She notes that since starting her anxiety is decreasing.  She is not having to take xanax any longer.  She is also regularly seeing a Social worker.  REVIEW OF SYSTEMS:  Review of Systems  Constitutional: Negative for appetite change, chills, fatigue, fever and unexpected weight change.  HENT:   Negative for hearing loss, lump/mass, mouth sores, nosebleeds, sore throat and trouble swallowing.   Eyes: Negative for eye problems and icterus.  Respiratory: Negative for chest tightness, cough and shortness of breath.   Cardiovascular: Negative  for chest pain, leg swelling and palpitations.  Gastrointestinal: Negative for abdominal distention, abdominal pain, constipation, diarrhea, nausea and vomiting.  Endocrine: Negative for hot flashes.  Musculoskeletal: Negative for arthralgias.  Skin: Negative for itching and rash.  Neurological: Negative for dizziness, extremity weakness, headaches and numbness.  Hematological: Negative for adenopathy. Does not bruise/bleed easily.  Psychiatric/Behavioral: Negative for depression. The patient is not nervous/anxious.   Breast: Denies any new nodularity, masses, tenderness, nipple changes, or nipple discharge.    ONCOLOGY TREATMENT TEAM:  1. Surgeon:  Dr. Marlou Starks at [facility] 2. Medical Oncologist: Dr. Lindi Adie  3. Plastic Surgeon: Dr. Marla Roe    PAST MEDICAL/SURGICAL HISTORY:  Past Medical History:  Diagnosis Date  . Anxiety   . Diverticulosis   . Hypertension 11/08/2017  . Malignant neoplasm of upper-outer quadrant of left breast in female, estrogen receptor positive ([place]) 06/08/2018  . Sinusitis 11/15/2017   Past Surgical History:  Procedure Laterality Date  . BREAST RECONSTRUCTION WITH PLACEMENT OF TISSUE EXPANDER AND ALLODERM N/A 08/14/2018   Procedure: BREAST RECONSTRUCTION WITH PLACEMENT OF TISSUE EXPANDER AND FLEXHD;  Surgeon: Wallace Going, DO;  Location: [place];  Service: Plastics;  Laterality: N/A;  . BREAST SURGERY    . DILATION AND CURETTAGE OF UTERUS    . LASIK    . LIPOSUCTION Bilateral 11/15/2018   Procedure: LIPOSUCTION;  Surgeon: Wallace Going, DO;  Location: [place];  Service: Plastics;  Laterality: Bilateral;  . MASTECTOMY W/ SENTINEL NODE BIOPSY Bilateral 08/14/2018   with breast reconstruction  . MASTECTOMY W/ SENTINEL NODE BIOPSY Bilateral 08/14/2018   Procedure: BILATERAL MASTECTOMIES  WITH LEFT SENTINEL LYMPH NODE MAPPING;  Surgeon: Jovita Kussmaul, MD;  Location: [place];  Service: General;  Laterality: Bilateral;  .  REMOVAL OF BILATERAL TISSUE EXPANDERS WITH PLACEMENT OF BILATERAL BREAST IMPLANTS Bilateral 11/15/2018   Procedure: removal of bilateral expanders and placement of bilateral silicone implants.;  Surgeon: Wallace Going, DO;  Location: [place];  Service: Plastics;  Laterality: Bilateral;     ALLERGIES:  Allergies  Allergen Reactions  . Antihistamines, Chlorpheniramine-Type Shortness Of Breath    Makes heart race  . Sudafed [Pseudoephedrine Hcl]     shaking  . Sulfamethoxazole Nausea And Vomiting  . Erythromycin Nausea Only     CURRENT MEDICATIONS:  Outpatient Encounter Medications as of 01/10/2019  Medication Sig  . ALPRAZolam (XANAX) 0.5 MG tablet Take 1-2 tablets (0.5-1 mg total) by mouth daily as needed for anxiety.  . Ascorbic Acid (VITAMIN C PO) Take by mouth.  Mariane Baumgarten Calcium (STOOL SOFTENER PO) Take 1 tablet by mouth 2 (two) times daily.   Marland Kitchen escitalopram (LEXAPRO) 5 mg TABS tablet Take 1 tablet by mouth daily.  . fluticasone (FLONASE) 50 MCG/ACT nasal spray PLACE 2 SPRAYS INTO THE NOSE DAILY (Patient taking differently: Place 2 sprays into both nostrils daily. )  . hydrochlorothiazide (MICROZIDE) 12.5 MG capsule TAKE 1 CAPSULE BY MOUTH EVERY DAY  . loratadine (CLARITIN) 10 MG tablet Take 10 mg by mouth daily.  . metoprolol succinate (TOPROL-XL) 25 MG 24 hr tablet Take 1 tablet (25 mg  total) by mouth daily.  . Multiple Vitamins-Minerals (MULTIVITAMIN WOMEN PO) Take 1 capsule by mouth daily.  . Multiple Vitamins-Minerals (ZINC PO) Take by mouth.  . Probiotic Product (PRO-BIOTIC BLEND PO) Take 1 tablet by mouth daily.    No facility-administered encounter medications on file as of 01/10/2019.      ONCOLOGIC FAMILY HISTORY:  Family History  Problem Relation Age of Onset  . Colon cancer Maternal Grandfather   . Melanoma Paternal Grandfather   . Heart attack Maternal Grandmother   . Hypertension Mother   . Hyperlipidemia Father   . Vaginal cancer  Maternal Aunt      GENETIC COUNSELING/TESTING: See above  SOCIAL HISTORY:  Social History   Socioeconomic History  . Marital status: Married    Spouse name: Ronalee Belts  . Number of children: 3  . Years of education: Masters  . Highest education level: Not on file  Occupational History  . Occupation: Education officer, museum    Comment: [employer]    Employer: [employer]  . Financial resource strain: Not on file  . Food insecurity:    Worry: Not on file    Inability: Not on file  . Transportation needs:    Medical: No    Non-medical: No  Tobacco Use  . Smoking status: Former Smoker    Types: Cigarettes    Last attempt to quit: 06/13/1988    Years since quitting: 30.5  . Smokeless tobacco: Never Used  . Tobacco comment: She smoked a couple of months in her teens.   Substance and Sexual Activity  . Alcohol use: Yes    Alcohol/week: 3.0 standard drinks    Types: 3 Glasses of wine per week  . Drug use: No  . Sexual activity: Yes    Partners: Male  Lifestyle  . Physical activity:    Days per week: Not on file    Minutes per session: Not on file  . Stress: Not on file  Relationships  . Social connections:    Talks on phone: Not on file    Gets together: Not on file    Attends religious service: Not on file    Active member of club or organization: Not on file    Attends meetings of clubs or organizations: Not on file    Relationship status: Not on file  . Intimate partner violence:    Fear of current or ex partner: No    Emotionally abused: No    Physically abused: No    Forced sexual activity: No  Other Topics Concern  . Not on file  Social History Narrative   Treadmill 5 days per week 2 caffeine drinks per day.      OBSERVATIONS/OBJECTIVE:   LABORATORY DATA:  None for this visit.  DIAGNOSTIC IMAGING:  None for this visit.      ASSESSMENT AND PLAN:  Shannon May is a pleasant 48 y.o. female with Stage 0 left breast DCIS, ER+/PR+,  diagnosed in 05/2018, treated with bilateral mastectomies.  She presents to the Survivorship Clinic for our initial meeting and routine follow-up post-completion of treatment for breast cancer.    1. Stage 0 left breast cancer:  Shannon May is continuing to recover from definitive treatment for breast cancer. She will have her surveillance monitoring with Dr. Marlou Starks.  She will see her oncologist on an as needed basis.  Today, a comprehensive survivorship care plan and treatment summary was reviewed with the patient today detailing her breast cancer  diagnosis, treatment course, potential late/long-term effects of treatment, appropriate follow-up care with recommendations for the future, and patient education resources.  A copy of this summary, along with a letter will be sent to the patient's primary care provider via mail/fax/In Basket message after today's visit.    2. Bone health:  Given Shannon May's history of breast cancer, she is at risk for bone demineralization.    She was given education on specific activities to promote bone health.  3. Cancer screening:  Due to Shannon May's history and her age, she should receive screening for skin cancers, colon cancer, and gynecologic cancers.  The information and recommendations are listed on the patient's comprehensive care plan/treatment summary and were reviewed in detail with the patient.    4. Health maintenance and wellness promotion: Shannon May was encouraged to consume 5-7 servings of fruits and vegetables per day. We reviewed the "Nutrition Rainbow" handout.  She was also encouraged to engage in moderate to vigorous exercise for 30 minutes per day most days of the week. We discussed the [employer] fitness program, which is designed for cancer survivors to help them become more physically fit after cancer treatments.  She was instructed to limit her alcohol consumption and continue to abstain from tobacco use.     5. Support  services/counseling: It is not uncommon for this period of the patient's cancer care trajectory to be one of many emotions and stressors.  We discussed how this can be increasingly difficult during the times of quarantine and social distancing due to the COVID-19 pandemic.   She was given information regarding our available services and encouraged to contact me with any questions or for help enrolling in any of our support group/programs.   6. Left upper arm pain: I placed a referral to PT in [place] (per patient request) for evaluation and management of concerns.  She would like to defer this for one month, so I noted that in the referral.  I did let her know that if we need to change or expedite anything to please let us know.   Follow up instructions:    -Return to cancer center PRN  -Follow up with surgery per Dr. Marlou Starks, 02/2019 -She is welcome to return back to the Survivorship Clinic at any time; no additional follow-up needed at this time.  -Consider referral back to survivorship as a long-term survivor for continued surveillance  The patient was provided an opportunity to ask questions and all were answered. The patient agreed with the plan and demonstrated an understanding of the instructions.   The patient was advised to call back or seek an in-person evaluation if the symptoms worsen or if the condition fails to improve as anticipated.   I provided 25 minutes of face-to-face video visit time during this encounter, and > 50% was spent counseling as documented under my assessment & plan.  Scot Dock, NP

## 2019-01-09 NOTE — Telephone Encounter (Signed)
Patient agreed to [employer]. On 01/10/19. Emailed step-by-step instructions how to download and access mtg. Pt will call with any questions.

## 2019-01-09 NOTE — Progress Notes (Signed)
Her past week of BPs (beginning 4/28):  MIDDAY: 130/77    HR:  76 PM:          114/74   HR:  68       MIDDAY:  135/79   HR:  64 PM:          132/77   HR:  69  (did not take) PM:          116/66   HR:  61   5/1            123/74    HR: 64                  115/66           65  5/2            137/74          57  5/3            120/67          66                   107/65           66 5/4            119/67           67 .Elouise Munroe, [place]

## 2019-01-10 ENCOUNTER — Inpatient Hospital Stay: Payer: BC Managed Care – PPO | Attending: Adult Health | Admitting: Adult Health

## 2019-01-10 ENCOUNTER — Encounter: Payer: Self-pay | Admitting: Adult Health

## 2019-01-10 DIAGNOSIS — C50412 Malignant neoplasm of upper-outer quadrant of left female breast: Secondary | ICD-10-CM

## 2019-01-10 DIAGNOSIS — Z8049 Family history of malignant neoplasm of other genital organs: Secondary | ICD-10-CM

## 2019-01-10 DIAGNOSIS — I10 Essential (primary) hypertension: Secondary | ICD-10-CM | POA: Diagnosis not present

## 2019-01-10 DIAGNOSIS — Z79899 Other long term (current) drug therapy: Secondary | ICD-10-CM

## 2019-01-10 DIAGNOSIS — Z87891 Personal history of nicotine dependence: Secondary | ICD-10-CM

## 2019-01-10 DIAGNOSIS — Z170 Estrogen receptor positive status [ER+]: Secondary | ICD-10-CM

## 2019-01-10 DIAGNOSIS — Z800 Family history of malignant neoplasm of digestive organs: Secondary | ICD-10-CM

## 2019-01-10 DIAGNOSIS — F418 Other specified anxiety disorders: Secondary | ICD-10-CM

## 2019-01-11 ENCOUNTER — Ambulatory Visit (INDEPENDENT_AMBULATORY_CARE_PROVIDER_SITE_OTHER): Payer: BC Managed Care – PPO | Admitting: Psychology

## 2019-01-11 ENCOUNTER — Ambulatory Visit: Payer: BC Managed Care – PPO | Admitting: Physical Therapy

## 2019-01-11 DIAGNOSIS — F064 Anxiety disorder due to known physiological condition: Secondary | ICD-10-CM | POA: Diagnosis not present

## 2019-01-15 ENCOUNTER — Ambulatory Visit: Payer: BC Managed Care – PPO | Attending: Hematology and Oncology | Admitting: Physical Therapy

## 2019-01-15 ENCOUNTER — Encounter: Payer: Self-pay | Admitting: Physical Therapy

## 2019-01-15 ENCOUNTER — Other Ambulatory Visit: Payer: Self-pay

## 2019-01-15 DIAGNOSIS — M25612 Stiffness of left shoulder, not elsewhere classified: Secondary | ICD-10-CM | POA: Diagnosis present

## 2019-01-15 DIAGNOSIS — Z483 Aftercare following surgery for neoplasm: Secondary | ICD-10-CM

## 2019-01-15 DIAGNOSIS — M79602 Pain in left arm: Secondary | ICD-10-CM

## 2019-01-15 NOTE — Therapy (Signed)
[place], [place], [address] Phone: [phone]   Fax:  [phone]  Physical Therapy Evaluation  Patient Details  Name: Shannon May MRN: [id number] Date of Birth: 02/02/1971 Referring Provider (PT): Wilber Bihari, NP   Encounter Date: 01/15/2019  PT End of Session - 01/15/19 1642    Visit Number  1    Number of Visits  2    Date for PT Re-Evaluation  02/12/19    PT Start Time  1300    PT Stop Time  1352    PT Time Calculation (min)  52 min    Activity Tolerance  Patient tolerated treatment well    Behavior During Therapy  [facility] for tasks assessed/performed       Past Medical History:  Diagnosis Date  . Anxiety   . Diverticulosis   . Hypertension 11/08/2017  . Malignant neoplasm of upper-outer quadrant of left breast in female, estrogen receptor positive ([place]) 06/08/2018  . Sinusitis 11/15/2017    Past Surgical History:  Procedure Laterality Date  . BREAST RECONSTRUCTION WITH PLACEMENT OF TISSUE EXPANDER AND ALLODERM N/A 08/14/2018   Procedure: BREAST RECONSTRUCTION WITH PLACEMENT OF TISSUE EXPANDER AND FLEXHD;  Surgeon: Wallace Going, DO;  Location: [place];  Service: Plastics;  Laterality: N/A;  . BREAST SURGERY    . DILATION AND CURETTAGE OF UTERUS    . LASIK    . LIPOSUCTION Bilateral 11/15/2018   Procedure: LIPOSUCTION;  Surgeon: Wallace Going, DO;  Location: [place];  Service: Plastics;  Laterality: Bilateral;  . MASTECTOMY W/ SENTINEL NODE BIOPSY Bilateral 08/14/2018   with breast reconstruction  . MASTECTOMY W/ SENTINEL NODE BIOPSY Bilateral 08/14/2018   Procedure: BILATERAL MASTECTOMIES  WITH LEFT SENTINEL LYMPH NODE MAPPING;  Surgeon: Jovita Kussmaul, MD;  Location: [place];  Service: General;  Laterality: Bilateral;  . REMOVAL OF BILATERAL TISSUE EXPANDERS WITH PLACEMENT OF BILATERAL BREAST IMPLANTS Bilateral 11/15/2018   Procedure: removal of bilateral  expanders and placement of bilateral silicone implants.;  Surgeon: Wallace Going, DO;  Location: [place];  Service: Plastics;  Laterality: Bilateral;    There were no vitals filed for this visit.   Subjective Assessment - 01/15/19 1555    Subjective  Patient requested to come to PT today to see if there is anything that can improve her left shoulder ROM and axillary cording. She had breast reconstruction completed on 11/14/2018 which followed her bilateral mastectomies on 08/14/2018. Her reconstruction involved implants. She had left ER/PR positive breast cancer and a left sentinel node biopsy with 0/3 nodes positive.    Pertinent History  Bilateral mastectomy with left SLNB 08/14/2018; breast reconstruction 11/14/2018. Hx diverticulitis.    Patient Stated Goals  Try to improve my arm ROM and learn about lymphedema prevention    Currently in Pain?  Yes    Pain Score  2     Pain Location  Axilla    Pain Orientation  Left    Pain Descriptors / Indicators  Tightness    Pain Type  Surgical pain    Pain Radiating Towards  Radiates down into her medial elbow    Pain Onset  1 to 4 weeks ago    Pain Frequency  Intermittent    Aggravating Factors   Reaching left arm out to the side    Pain Relieving Factors  Rest         [facility] PT Assessment - 01/15/19 0001  Assessment   Medical Diagnosis  s/p bi mastectomy and left SLNB    Referring Provider (PT)  Wilber Bihari, NP    Onset Date/Surgical Date  11/14/18    Hand Dominance  Right    Prior Therapy  none      Precautions   Precautions  Other (comment)    Precaution Comments  recent breast surgery; left arm lymphedema risk      Restrictions   Weight Bearing Restrictions  No      Balance Screen   Has the patient fallen in the past 6 months  No    Has the patient had a decrease in activity level because of a fear of falling?   No    Is the patient reluctant to leave their home because of a fear of falling?   No       Home Social worker  Private residence    Living Arrangements  Spouse/significant other;Children   Husband and 3 grown children   Available Help at Discharge  Family      Prior Function   Level of Independence  Independent    Vocation  Full time employment    Radio broadcast assistant at [employer]; currently teaching online    Leisure  She walks 2-4 miles/day and lifts some weights      Cognition   Overall Cognitive Status  Within Functional Limits for tasks assessed      Observation/Other Assessments   Observations  Incisions appear well healed on chest; visible cording present in left axilla      Posture/Postural Control   Posture/Postural Control  Postural limitations    Postural Limitations  Rounded Shoulders;Forward head      ROM / Strength   AROM / PROM / Strength  AROM;Strength      AROM   AROM Assessment Site  Shoulder    Right/Left Shoulder  Right;Left    Right Shoulder Extension  52 Degrees    Right Shoulder Flexion  141 Degrees    Right Shoulder ABduction  173 Degrees    Right Shoulder Internal Rotation  63 Degrees    Right Shoulder External Rotation  83 Degrees    Left Shoulder Extension  60 Degrees    Left Shoulder Flexion  132 Degrees    Left Shoulder ABduction  128 Degrees    Left Shoulder Internal Rotation  62 Degrees    Left Shoulder External Rotation  75 Degrees      Strength   Overall Strength  Within functional limits for tasks performed        LYMPHEDEMA/ONCOLOGY QUESTIONNAIRE - 01/15/19 1639      Type   Cancer Type  Left breast      Surgeries   Mastectomy Date  08/14/18    Saline Implant Reconstruction Date  11/14/18    Sentinel Lymph Node Biopsy Date  08/14/18    Number Lymph Nodes Removed  3      Treatment   Active Chemotherapy Treatment  No    Past Chemotherapy Treatment  No    Active Radiation Treatment  No    Past Radiation Treatment  No    Current Hormone Treatment  No    Past Hormone  Therapy  Yes    Date  --   Neoadjuvant   Drug Name  Tamoxifen      What other symptoms do you have   Are you Having Heaviness or Tightness  Yes  Are you having Pain  Yes    Are you having pitting edema  No    Is it Hard or Difficult finding clothes that fit  No    Do you have infections  No    Is there Decreased scar mobility  No    Stemmer Sign  No      Lymphedema Assessments   Lymphedema Assessments  Upper extremities      Right Upper Extremity Lymphedema   10 cm Proximal to Olecranon Process  30 cm    Olecranon Process  26.7 cm    10 cm Proximal to Ulnar Styloid Process  24.5 cm    Just Proximal to Ulnar Styloid Process  16.4 cm    Across Hand at [employer]  19.8 cm    At [place] of 2nd Digit  6.5 cm      Left Upper Extremity Lymphedema   10 cm Proximal to Olecranon Process  30.3 cm    Olecranon Process  27 cm    10 cm Proximal to Ulnar Styloid Process  23.4 cm    Just Proximal to Ulnar Styloid Process  16.4 cm    Across Hand at [employer]  19 cm    At [place] of 2nd Digit  6.3 cm             Objective measurements completed on examination: See above findings.              PT Education - 01/15/19 1641    Education Details  Lymphedema risk reduction (issued ABC packet); HEP for shoulder ROM and neural stretching    Person(s) Educated  Patient    Methods  Explanation;Demonstration;Handout    Comprehension  Returned demonstration;Verbalized understanding          PT Long Term Goals - 01/15/19 1648      PT LONG TERM GOAL #1   Title  Patient will verbalize good understanding of lymphedema risk reduction practices.    Time  1    Period  Days    Status  Achieved      PT LONG TERM GOAL #2   Title  Patient will demonstrate she is independent with her HEP for shoulder ROM.    Time  4    Period  Weeks    Status  New      PT LONG TERM GOAL #3   Title  Patient will report >/= 50% reduction in axillary and arm pain and tightness to better  tolerate her daily tasks.    Time  4    Period  Weeks    Status  New      PT LONG TERM GOAL #4   Title  Patient will increase left shoulder abduction to >/= 160 degrees for increased ease with reaching.    Time  4    Period  Weeks    Status  New             Plan - 01/15/19 1642    Clinical Impression Statement  Patint is doing very well s/p bilateral mastectomy with reconstruction. She has some visible and palpable cording present in her left axilla which she reports she can feel extending into her medial elbow. Her shoulder ROM is somewhat limited on the left but is only significantly limited with abduction which may be directly related to her cording. She was not eager to regularly attend physical therapy because of Covid-19 but exercises were issued and she plans  to do those for 2 weeks and then f/u with a Telehealth visit to determine if she needs progression of exercises or needs to come back in person to PT. She was educated on lymphedema risk reduction today and felt comfortable with that information.    Stability/Clinical Decision Making  Stable/Uncomplicated    Clinical Decision Making  Low    Rehab Potential  Excellent    PT Frequency  Biweekly    PT Duration  4 weeks    PT Treatment/Interventions  ADLs/Self Care Home Management;Patient/family education;Therapeutic exercise;Manual techniques;Scar mobilization;Passive range of motion    PT Next Visit Plan  Will f/u in 2 weeks with a telehealth visit to see if her cording has reduced and ROM improved; if not, will consider in-person PT    PT Home Exercise Plan  Shoulder ROM and neural stretching    Consulted and Agree with Plan of Care  Patient       Patient will benefit from skilled therapeutic intervention in order to improve the following deficits and impairments:  Decreased range of motion, Increased fascial restricitons, Pain, Impaired UE functional use, Decreased scar mobility, Postural dysfunction  Visit  Diagnosis: Aftercare following surgery for neoplasm - Plan: PT plan of care cert/re-cert  Stiffness of left shoulder, not elsewhere classified - Plan: PT plan of care cert/re-cert  Pain in left arm - Plan: PT plan of care cert/re-cert     Problem List Patient Active Problem List   Diagnosis Date Noted  . S/P breast reconstruction, bilateral 12/22/2018  . S/P mastectomy, bilateral 09/15/2018  . Acquired absence of breast 08/22/2018  . Breast cancer ([place]) 08/14/2018  . 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 Symptomatic mammary hypertrophy 06/08/2018  . Malignant neoplasm of upper-outer quadrant of left breast in female, estrogen receptor positive ([place]) 06/08/2018  . Nodule of ear canal, bilateral 11/15/2017  . Hypertension 11/08/2017  . Venous stasis 05/13/2016  . Plantar fasciitis 05/23/2014  . Lumbar radiculitis 05/23/2014  . Verruca pedis 11/30/2012  . Left subacromial bursitis 11/30/2012  . DERMATITIS, 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/06/2010  . HEADACHE 04/06/2010  . HEEL PAIN, RIGHT 03/12/2009  . ALLERGIC RHINITIS 05/09/2008   Annia Friendly, PT 01/15/19 4:51 PM  [place], [place], [address] Phone: [phone]   Fax:  [phone]  Name: Shannon May MRN: [id number] Date of Birth: 1971-08-23

## 2019-01-15 NOTE — Patient Instructions (Signed)
Axillary web syndrome (also called cording) can happen after having breast cancer surgery when lymph nodes in the armpit are removed. It presents as if you have a thin cord in your arm and can run from the armpit all the way down into the forearm. If you've had a sentinel node biopsy, the risk is 1-20% and if you've had an axillary lymph node dissection (more than 7 nodes removed), the risk is 36-72%. The ranges vary depending on the research study.  It most often happens 3-4 weeks post-op but can happen sooner or later. There are several possibilities for what cording actually is. Although no one knows for sure as of yet, it may be related to lymphatics, veins, or other tissue. Sometimes cording resolves on its own but other times it requires physical therapy with a therapist who specializes in lymphedema and/or cancer rehab. Treatment typically involves stretching, manual techniques, and exercise. Sometimes cords get "released" while stretching or during manual treatment and the patient may experience the sensation of a "pop." This may feel strange but it is not dangerous and is a sign that the cord has released; range of motion may be improved in the process.   Access Code: [facility]  URL: [email or web address]  Date: 01/15/2019  Prepared by: Arbutus Ped   Exercises  Shoulder Flexion Wall Walk - 5 reps - 1 sets - 5-10 hold - 2x daily - 7x weekly  Standing Shoulder Abduction Finger Walk at Wall - 5 reps - 1 sets - 5-10 hold - 2x daily - 7x weekly  Standing Median Nerve Glide - 10 reps - 3 sets - 1x daily - 7x weekly    Patient was also instructed today in a home exercise program today for post op shoulder range of motion. These included active assist shoulder flexion in sitting, scapular retraction, wall walking with shoulder abduction, and hands behind head external rotation.  She was encouraged to do these twice a day, holding 3 seconds and repeating 5 times when permitted by her  physician.

## 2019-01-18 ENCOUNTER — Telehealth: Payer: Self-pay | Admitting: Plastic Surgery

## 2019-01-18 NOTE — Telephone Encounter (Signed)
Called patient to confirm appointment scheduled for tomorrow. Patient answered the following questions: °1.Has the patient traveled outside of the state of [place] at all within the past 6 weeks? No °2.Does the patient have a fever or cough at all? No °3.Has the patient been tested for COVID? Had a positive COVID test? No °4. Has the patient been in contact with anyone who has tested positive? No ° °

## 2019-01-19 ENCOUNTER — Ambulatory Visit (INDEPENDENT_AMBULATORY_CARE_PROVIDER_SITE_OTHER): Payer: BC Managed Care – PPO | Admitting: Plastic Surgery

## 2019-01-19 ENCOUNTER — Encounter: Payer: Self-pay | Admitting: Plastic Surgery

## 2019-01-19 ENCOUNTER — Other Ambulatory Visit: Payer: Self-pay

## 2019-01-19 VITALS — BP 113/71 | HR 68 | Temp 98.2°F | Ht 68.0 in | Wt 192.4 lb

## 2019-01-19 DIAGNOSIS — Z9889 Other specified postprocedural states: Secondary | ICD-10-CM

## 2019-01-19 DIAGNOSIS — Z9013 Acquired absence of bilateral breasts and nipples: Secondary | ICD-10-CM

## 2019-01-19 NOTE — Progress Notes (Addendum)
   Subjective:    Patient ID: Shannon May, female    DOB: 05-17-1971, 48 y.o.   MRN: [id number]  The patient is a 48 year old female here for follow-up on her bilateral breast reconstruction with implant placement.  She is doing very well today.  The implants are settling in very nicely.  She notices a little ridge on the right medial part of the breast.  This appears to be a combination of ribs and capsule.  She has been massaging this and it seems to be improving.  Her skin swelling is markedly improved.  She has no sign of hematoma, seroma or infection.  Her incisions are all healing very nicely.  She has started physical therapy and is pleased with the progress.  She has increased mobility and range of motion of the left arm.   Review of Systems  Constitutional: Negative.  Negative for activity change and appetite change.  HENT: Negative.   Eyes: Negative.   Respiratory: Negative.   Cardiovascular: Negative.  Negative for leg swelling.  Gastrointestinal: Negative.   Genitourinary: Negative.   Musculoskeletal: Negative.   Psychiatric/Behavioral: Negative.        Objective:   Physical Exam Vitals signs and nursing note reviewed.  Constitutional:      Appearance: Normal appearance.  HENT:     Head: Normocephalic and atraumatic.  Cardiovascular:     Rate and Rhythm: Normal rate.  Pulmonary:     Effort: Pulmonary effort is normal.  Abdominal:     General: Abdomen is flat.  Neurological:     General: No focal deficit present.     Mental Status: She is alert and oriented to person, place, and time.  Psychiatric:        Mood and Affect: Mood normal.        Behavior: Behavior normal.        Assessment & Plan:  S/P breast reconstruction, bilateral  S/P mastectomy, bilateral  The patient is interested in lipofilling and for improved symmetry and contour.  She will come see me in a couple of months to talk about it further.  After that then she would like to have the  nipple areole a tattoo. Pictures taken and placed in the chart with the patient permission. Follow-up in 2 to 3 months.

## 2019-01-24 ENCOUNTER — Ambulatory Visit (INDEPENDENT_AMBULATORY_CARE_PROVIDER_SITE_OTHER): Payer: BC Managed Care – PPO | Admitting: Psychology

## 2019-01-24 DIAGNOSIS — F064 Anxiety disorder due to known physiological condition: Secondary | ICD-10-CM

## 2019-01-30 ENCOUNTER — Encounter: Payer: Self-pay | Admitting: Physical Therapy

## 2019-01-30 ENCOUNTER — Other Ambulatory Visit: Payer: Self-pay

## 2019-01-30 ENCOUNTER — Ambulatory Visit: Payer: BC Managed Care – PPO | Admitting: Physical Therapy

## 2019-01-30 DIAGNOSIS — Z483 Aftercare following surgery for neoplasm: Secondary | ICD-10-CM

## 2019-01-30 DIAGNOSIS — M79602 Pain in left arm: Secondary | ICD-10-CM

## 2019-01-30 DIAGNOSIS — M25612 Stiffness of left shoulder, not elsewhere classified: Secondary | ICD-10-CM

## 2019-01-30 NOTE — Therapy (Signed)
[place], [place], [address] Phone: [phone]   Fax:  [phone]  Physical Therapy Treatment  Patient Details  Name: Shannon May MRN: [id number] Date of Birth: [birth year] Referring Provider (PT): Wilber Bihari, NP   Encounter Date: 01/30/2019   Physical Therapy Telehealth Visit:  I connected with Bunnie Philips today at 13:00 by Webex video conference and verified that I am speaking with the correct person using two identifiers.  I discussed the limitations, risks, security and privacy concerns of performing an evaluation and management service by Webex and the availability of in person appointments.  I also discussed with the patient that there may be a patient responsible charge related to this service. The patient expressed understanding and agreed to proceed.    The patient's address was confirmed.  Identified to the patient that therapist is a licensed physical therapist in the state of [place].  Verified phone # as [phone] to call in case of technical difficulties.     PT End of Session - 01/30/19 1347    Visit Number  2    Number of Visits  2    PT Start Time  1302    PT Stop Time  1335    PT Time Calculation (min)  33 min    Activity Tolerance  Patient tolerated treatment well    Behavior During Therapy  WFL for tasks assessed/performed       Past Medical History:  Diagnosis Date  . Anxiety   . Diverticulosis   . Hypertension 11/08/2017  . Malignant neoplasm of upper-outer quadrant of left breast in female, estrogen receptor positive ([place]) 06/08/2018  . Sinusitis 11/15/2017    Past Surgical History:  Procedure Laterality Date  . BREAST RECONSTRUCTION WITH PLACEMENT OF TISSUE EXPANDER AND ALLODERM N/A 08/14/2018   Procedure: BREAST RECONSTRUCTION WITH PLACEMENT OF TISSUE EXPANDER AND FLEXHD;  Surgeon: Wallace Going, DO;  Location: [place];  Service: Plastics;  Laterality:  N/A;  . BREAST SURGERY    . DILATION AND CURETTAGE OF UTERUS    . LASIK    . LIPOSUCTION Bilateral 11/15/2018   Procedure: LIPOSUCTION;  Surgeon: Wallace Going, DO;  Location: [place];  Service: Plastics;  Laterality: Bilateral;  . MASTECTOMY W/ SENTINEL NODE BIOPSY Bilateral 08/14/2018   with breast reconstruction  . MASTECTOMY W/ SENTINEL NODE BIOPSY Bilateral 08/14/2018   Procedure: BILATERAL MASTECTOMIES  WITH LEFT SENTINEL LYMPH NODE MAPPING;  Surgeon: Jovita Kussmaul, MD;  Location: [place];  Service: General;  Laterality: Bilateral;  . REMOVAL OF BILATERAL TISSUE EXPANDERS WITH PLACEMENT OF BILATERAL BREAST IMPLANTS Bilateral 11/15/2018   Procedure: removal of bilateral expanders and placement of bilateral silicone implants.;  Surgeon: Wallace Going, DO;  Location: [place];  Service: Plastics;  Laterality: Bilateral;    There were no vitals filed for this visit.  Subjective Assessment - 01/30/19 1343    Subjective  Patient reports she has been doing her HEP and is doing much better. She felt and heard an audible pop yesterday in her arm while exercising and reports she thinks her cord released because her ROM was instantly restored.    Pertinent History  Bilateral mastectomy with left SLNB 08/14/2018; breast reconstruction 11/14/2018. Hx diverticulitis.    Patient Stated Goals  Try to improve my arm ROM and learn about lymphedema prevention    Currently in Pain?  Yes    Pain Score  1  Pain Location  Axilla    Pain Orientation  Left    Pain Descriptors / Indicators  Tightness    Pain Type  Surgical pain    Pain Onset  1 to 4 weeks ago         [facility] PT Assessment - 01/30/19 0001      AROM   AROM Assessment Site  Shoulder    Right/Left Shoulder  Right;Left    Right Shoulder Extension  72 Degrees    Right Shoulder Flexion  160 Degrees    Right Shoulder ABduction  175 Degrees    Right Shoulder Internal Rotation  63 Degrees     Right Shoulder External Rotation  88 Degrees    Left Shoulder Extension  70 Degrees    Left Shoulder Flexion  170 Degrees    Left Shoulder ABduction  175 Degrees    Left Shoulder Internal Rotation  76 Degrees    Left Shoulder External Rotation  88 Degrees         PT Education - 01/30/19 1346    Education Details  New HEP for strengthening shoulders and information on lymphedema risk reduction.    Person(s) Educated  Patient    Methods  Explanation;Demonstration;Handout    Comprehension  Verbalized understanding;Returned demonstration          PT Long Term Goals - 01/30/19 1352      PT LONG TERM GOAL #1   Title  Patient will verbalize good understanding of lymphedema risk reduction practices.    Time  1    Period  Days    Status  Achieved      PT LONG TERM GOAL #2   Title  Patient will demonstrate she is independent with her HEP for shoulder ROM.    Time  4    Period  Weeks    Status  Achieved      PT LONG TERM GOAL #3   Title  Patient will report >/= 50% reduction in axillary and arm pain and tightness to better tolerate her daily tasks.    Time  4    Period  Weeks    Status  Achieved      PT LONG TERM GOAL #4   Title  Patient will increase left shoulder abduction to >/= 160 degrees for increased ease with reaching.    Time  4    Period  Weeks    Status  Achieved            Plan - 01/30/19 1348    Clinical Impression Statement  Patient has made significant gains over the past 2 weeks with her shoulder ROM. She is very pleased with her progress and is eager to begin strengthening. Her axillary cording appears to have resolved although she reports a mild feeling of tightness in her anterior left forearm. She was encouraged to continue the neural stretch to reduce that tightness. She feels ready for discharge but knows to contact me if she has any other concerns.    PT Treatment/Interventions  ADLs/Self Care Home Management;Patient/family education;Therapeutic  exercise;Manual techniques;Scar mobilization;Passive range of motion    PT Next Visit Plan  D/C    PT Home Exercise Plan  Shoulder ROM and neural stretching    Consulted and Agree with Plan of Care  Patient       Patient will benefit from skilled therapeutic intervention in order to improve the following deficits and impairments:  Decreased range of motion, Increased fascial restricitons, Pain, Impaired  UE functional use, Decreased scar mobility, Postural dysfunction  Visit Diagnosis: Aftercare following surgery for neoplasm  Stiffness of left shoulder, not elsewhere classified  Pain in left arm     Problem List Patient Active Problem List   Diagnosis Date Noted  . S/P breast reconstruction, bilateral 12/22/2018  . S/P mastectomy, bilateral 09/15/2018  . Acquired absence of breast 08/22/2018  . Breast cancer ([place]) 08/14/2018  . 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 Symptomatic mammary hypertrophy 06/08/2018  . Malignant neoplasm of upper-outer quadrant of left breast in female, estrogen receptor positive ([place]) 06/08/2018  . Nodule of ear canal, bilateral 11/15/2017  . Hypertension 11/08/2017  . Venous stasis 05/13/2016  . Plantar fasciitis 05/23/2014  . Lumbar radiculitis 05/23/2014  . Verruca pedis 11/30/2012  . Left subacromial bursitis 11/30/2012  . DERMATITIS, 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/06/2010  . HEADACHE 04/06/2010  . HEEL PAIN, RIGHT 03/12/2009  . ALLERGIC RHINITIS 05/09/2008   PHYSICAL THERAPY DISCHARGE SUMMARY  Visits from Start of Care: 2  Current functional level related to goals / functional outcomes: All goals met. Bilateral shoulder ROM is back to normal limits and she is ready to begin strengthening.   Remaining deficits: None   Education / Equipment: Home exercise program and lymphedema risk reduction education. Plan: Patient agrees to discharge.  Patient goals were met. Patient is being discharged due to meeting the stated rehab goals.   ?????         Annia Friendly, [place] 01/30/19 1:59 PM   [place], [place], [address] Phone: [phone]   Fax:  [phone]  Name: VIRNA LIVENGOOD MRN: [id number] Date of Birth: [birth year]

## 2019-01-30 NOTE — Patient Instructions (Signed)
Access Code: [email or web address]  URL: [email or web address]  Date: 01/30/2019  Prepared by: Arbutus Ped   Exercises  Scapular Retraction with Resistance - 10 reps - 2 sets - 1x daily - 7x weekly  Shoulder extension with resistance - Neutral - 10 reps - 2 sets - 1x daily - 7x weekly  Standing Backward Shoulder Rolls - 10 reps - 2 sets - 2x daily - 7x weekly  Doorway Pec Stretch at 90 Degrees Abduction - 5 reps - 1 sets - 5 hold - 2x daily - 7x weekly  Standing Shoulder Flexion to 90 Degrees with Dumbbells - 10 reps - 2 sets - 1x daily - 7x weekly  Shoulder Abduction with Dumbbells - Thumbs Up - 10 reps - 2 sets - 1x daily - 7x weekly  Standing Bicep Curls Supinated with Dumbbells - 10 reps - 2 sets - 1x daily - 7x weekly  Push-Up on Counter - 10 reps - 2 sets - 1x daily - 7x weekly    Educated patient on the above HEP and she returned demo correctly. We also reviewed previously given HEP and she is independent with doing those correctly. We reviewed information related to lymphedema risk reduction and she reported a good understanding of that including some preventative measures while gardening.

## 2019-02-01 ENCOUNTER — Ambulatory Visit: Payer: BC Managed Care – PPO | Admitting: Psychology

## 2019-02-01 ENCOUNTER — Other Ambulatory Visit: Payer: Self-pay

## 2019-02-02 ENCOUNTER — Ambulatory Visit (INDEPENDENT_AMBULATORY_CARE_PROVIDER_SITE_OTHER): Payer: BC Managed Care – PPO | Admitting: Psychology

## 2019-02-02 DIAGNOSIS — F064 Anxiety disorder due to known physiological condition: Secondary | ICD-10-CM

## 2019-02-08 ENCOUNTER — Telehealth: Payer: Self-pay | Admitting: Plastic Surgery

## 2019-02-08 NOTE — Telephone Encounter (Signed)
Patient called with questions regarding fat grafting procedure.  1) How much fat would be taken from stomach and what would be the extent of scarring? 2) Could a tummy tuck be performed at the same time as the fat grafting?  3) What would be the recuperation time after a tummy tuck?  Contact number: [phone]

## 2019-02-09 ENCOUNTER — Ambulatory Visit (INDEPENDENT_AMBULATORY_CARE_PROVIDER_SITE_OTHER): Payer: BC Managed Care – PPO | Admitting: Psychology

## 2019-02-09 DIAGNOSIS — F064 Anxiety disorder due to known physiological condition: Secondary | ICD-10-CM | POA: Diagnosis not present

## 2019-02-14 ENCOUNTER — Encounter: Payer: Self-pay | Admitting: Plastic Surgery

## 2019-02-14 NOTE — Telephone Encounter (Signed)
Called and [facility] @ (8:38am @ [phone]) asking the patient to RTC regarding the message below.//AB/CMA

## 2019-02-15 ENCOUNTER — Ambulatory Visit (INDEPENDENT_AMBULATORY_CARE_PROVIDER_SITE_OTHER): Payer: BC Managed Care – PPO | Admitting: Psychology

## 2019-02-15 ENCOUNTER — Encounter (HOSPITAL_BASED_OUTPATIENT_CLINIC_OR_DEPARTMENT_OTHER): Payer: Self-pay | Admitting: *Deleted

## 2019-02-15 ENCOUNTER — Other Ambulatory Visit: Payer: Self-pay

## 2019-02-15 DIAGNOSIS — F064 Anxiety disorder due to known physiological condition: Secondary | ICD-10-CM | POA: Diagnosis not present

## 2019-02-15 NOTE — Telephone Encounter (Signed)
Spoke with the patient regarding the message below and informed her per Dr. Marla Roe that the scar for lipo with be 1/2 inch,she will be really sore for 1 week or 2, and she would like for her to come in to talk more about the tummy tuck.  Patient stated that she sent Dr. Marla Roe a MyChart message.  She stated that she feels she should wait on getting the tummy tuck, and she wants to go ahead and schedule for the fat grafting.  Informed the patient that I spoke with [facility] and she said she will give her a call back to schedule the surgery.  Patient verbalized understanding and agreed.//AB/CMA

## 2019-02-19 ENCOUNTER — Other Ambulatory Visit (HOSPITAL_COMMUNITY)
Admission: RE | Admit: 2019-02-19 | Discharge: 2019-02-19 | Disposition: A | Discharge status: Home / Self Care | Payer: BC Managed Care – PPO | Source: Ambulatory Visit | Attending: Plastic Surgery | Admitting: Plastic Surgery

## 2019-02-19 ENCOUNTER — Other Ambulatory Visit: Payer: Self-pay

## 2019-02-19 ENCOUNTER — Ambulatory Visit: Payer: BC Managed Care – PPO | Admitting: Family Medicine

## 2019-02-19 ENCOUNTER — Encounter (HOSPITAL_BASED_OUTPATIENT_CLINIC_OR_DEPARTMENT_OTHER)
Admission: RE | Admit: 2019-02-19 | Discharge: 2019-02-19 | Disposition: A | Discharge status: Home / Self Care | Payer: BC Managed Care – PPO | Source: Ambulatory Visit | Attending: Plastic Surgery | Admitting: Plastic Surgery

## 2019-02-19 DIAGNOSIS — Z87891 Personal history of nicotine dependence: Secondary | ICD-10-CM | POA: Diagnosis not present

## 2019-02-19 DIAGNOSIS — Z8049 Family history of malignant neoplasm of other genital organs: Secondary | ICD-10-CM | POA: Diagnosis not present

## 2019-02-19 DIAGNOSIS — Z853 Personal history of malignant neoplasm of breast: Secondary | ICD-10-CM | POA: Diagnosis not present

## 2019-02-19 DIAGNOSIS — Z881 Allergy status to other antibiotic agents status: Secondary | ICD-10-CM | POA: Diagnosis not present

## 2019-02-19 DIAGNOSIS — N651 Disproportion of reconstructed breast: Secondary | ICD-10-CM | POA: Diagnosis not present

## 2019-02-19 DIAGNOSIS — I10 Essential (primary) hypertension: Secondary | ICD-10-CM | POA: Diagnosis not present

## 2019-02-19 DIAGNOSIS — Z8249 Family history of ischemic heart disease and other diseases of the circulatory system: Secondary | ICD-10-CM | POA: Diagnosis not present

## 2019-02-19 DIAGNOSIS — F419 Anxiety disorder, unspecified: Secondary | ICD-10-CM | POA: Diagnosis not present

## 2019-02-19 DIAGNOSIS — Z1159 Encounter for screening for other viral diseases: Secondary | ICD-10-CM | POA: Diagnosis not present

## 2019-02-19 DIAGNOSIS — Z79899 Other long term (current) drug therapy: Secondary | ICD-10-CM | POA: Diagnosis not present

## 2019-02-19 DIAGNOSIS — Z170 Estrogen receptor positive status [ER+]: Secondary | ICD-10-CM | POA: Diagnosis not present

## 2019-02-19 DIAGNOSIS — Z7951 Long term (current) use of inhaled steroids: Secondary | ICD-10-CM | POA: Diagnosis not present

## 2019-02-19 DIAGNOSIS — Z800 Family history of malignant neoplasm of digestive organs: Secondary | ICD-10-CM | POA: Diagnosis not present

## 2019-02-19 DIAGNOSIS — Z888 Allergy status to other drugs, medicaments and biological substances status: Secondary | ICD-10-CM | POA: Diagnosis not present

## 2019-02-19 LAB — BASIC METABOLIC PANEL
Anion gap: 9 (ref 5–15)
BUN: 9 mg/dL (ref 6–20)
CO2: 27 mmol/L (ref 22–32)
Calcium: 9.4 mg/dL (ref 8.9–10.3)
Chloride: 102 mmol/L (ref 98–111)
Creatinine, Ser: 0.62 mg/dL (ref 0.44–1.00)
GFR calc Af Amer: >60 mL/min (ref >60)
GFR calc non Af Amer: >60 mL/min (ref >60)
Glucose, Bld: 106 mg/dL — ABNORMAL HIGH (ref 70–99)
Potassium: 4.4 mmol/L (ref 3.5–5.1)
Sodium: 138 mmol/L (ref 135–145)

## 2019-02-20 ENCOUNTER — Ambulatory Visit (INDEPENDENT_AMBULATORY_CARE_PROVIDER_SITE_OTHER): Payer: BC Managed Care – PPO | Admitting: Family Medicine

## 2019-02-20 ENCOUNTER — Encounter: Payer: Self-pay | Admitting: Family Medicine

## 2019-02-20 VITALS — BP 104/77 | HR 64 | Ht 68.0 in | Wt 192.0 lb

## 2019-02-20 DIAGNOSIS — I10 Essential (primary) hypertension: Secondary | ICD-10-CM | POA: Diagnosis not present

## 2019-02-20 MED ORDER — ESCITALOPRAM OXALATE 5 MG PO TABS: 2.5000 mg | ORAL_TABLET | Freq: Every day | ORAL | 0 refills | Status: DC

## 2019-02-20 NOTE — Progress Notes (Signed)
Virtual Visit via Video Note  I connected with Shannon May on 02/20/19 at 10:30 AM EDT by a video enabled telemedicine application and verified that I am speaking with the correct person using two identifiers.   I discussed the limitations of evaluation and management by telemedicine and the availability of in person appointments. The patient expressed understanding and agreed to proceed.  Pt was at home and I was in my office for the virtual visit.     Subjective:    CC: HTN  HPI:  Hypertension- Pt denies chest pain, SOB, dizziness, or heart palpitations.  Taking meds as directed w/o problems.  Denies medication side effects.  Highest is 125/79, pulse highest was 67. Lowest is 57.  Lowest 105/66, this morning 104/77.  She has been taking 1/2 tab bid.   She has surgery schedule in two days. She will be under general anesthesia. She had a COVID test today. She will have a fat transplant from abdomen to her breast tissue.   F/U anxiety -  She is doing really well.   She is only taking half a tab she is not using her Xanax at all.    Past medical history, Surgical history, Family history not pertinant except as noted below, Social history, Allergies, and medications have been entered into the medical record, reviewed, and corrections made.   Review of Systems: No fevers, chills, night sweats, weight loss, chest pain, or shortness of breath.   Objective:    General: Speaking clearly in complete sentences without any shortness of breath.  Alert and oriented x3.  Normal judgment. No apparent acute distress. Well groomed.     Impression and Recommendations:   HTN - drop the metoprolol to 1/2 tab daily. Then monitor for 2 weeks. Monitor for low BPs. Plan to eventually wean off.  F/u in 6 weeks. She wants to hold off until after her surgery to do med change.  Anxiety - still on half a tab and doing really well. She is still doing therapy.  will refill her medication.    I  discussed the assessment and treatment plan with the patient. The patient was provided an opportunity to ask questions and all were answered. The patient agreed with the plan and demonstrated an understanding of the instructions.   The patient was advised to call back or seek an in-person evaluation if the symptoms worsen or if the condition fails to improve as anticipated.   Beatrice Lecher, MD

## 2019-02-21 LAB — NOVEL CORONAVIRUS, NAA (HOSP ORDER, SEND-OUT TO REF LAB; TAT 18-24 HRS): SARS-CoV-2, NAA: NOT DETECTED

## 2019-02-22 ENCOUNTER — Ambulatory Visit (HOSPITAL_BASED_OUTPATIENT_CLINIC_OR_DEPARTMENT_OTHER): Payer: BC Managed Care – PPO | Admitting: Anesthesiology

## 2019-02-22 ENCOUNTER — Encounter (HOSPITAL_BASED_OUTPATIENT_CLINIC_OR_DEPARTMENT_OTHER): Payer: Self-pay | Admitting: *Deleted

## 2019-02-22 ENCOUNTER — Ambulatory Visit (HOSPITAL_BASED_OUTPATIENT_CLINIC_OR_DEPARTMENT_OTHER)
Admission: RE | Admit: 2019-02-22 | Discharge: 2019-02-22 | Disposition: A | Discharge status: Home / Self Care | Payer: BC Managed Care – PPO | Attending: Plastic Surgery | Admitting: Plastic Surgery

## 2019-02-22 ENCOUNTER — Other Ambulatory Visit: Payer: Self-pay

## 2019-02-22 ENCOUNTER — Encounter (HOSPITAL_BASED_OUTPATIENT_CLINIC_OR_DEPARTMENT_OTHER)
Admission: RE | Disposition: A | Discharge status: Home / Self Care | Payer: Self-pay | Source: Home / Self Care | Attending: Plastic Surgery

## 2019-02-22 DIAGNOSIS — Z9013 Acquired absence of bilateral breasts and nipples: Secondary | ICD-10-CM | POA: Diagnosis not present

## 2019-02-22 DIAGNOSIS — Z888 Allergy status to other drugs, medicaments and biological substances status: Secondary | ICD-10-CM | POA: Insufficient documentation

## 2019-02-22 DIAGNOSIS — Z79899 Other long term (current) drug therapy: Secondary | ICD-10-CM | POA: Insufficient documentation

## 2019-02-22 DIAGNOSIS — Z7951 Long term (current) use of inhaled steroids: Secondary | ICD-10-CM | POA: Insufficient documentation

## 2019-02-22 DIAGNOSIS — Z170 Estrogen receptor positive status [ER+]: Secondary | ICD-10-CM | POA: Insufficient documentation

## 2019-02-22 DIAGNOSIS — Z87891 Personal history of nicotine dependence: Secondary | ICD-10-CM | POA: Insufficient documentation

## 2019-02-22 DIAGNOSIS — Z8249 Family history of ischemic heart disease and other diseases of the circulatory system: Secondary | ICD-10-CM | POA: Insufficient documentation

## 2019-02-22 DIAGNOSIS — Z853 Personal history of malignant neoplasm of breast: Secondary | ICD-10-CM | POA: Insufficient documentation

## 2019-02-22 DIAGNOSIS — I10 Essential (primary) hypertension: Secondary | ICD-10-CM | POA: Insufficient documentation

## 2019-02-22 DIAGNOSIS — Z8049 Family history of malignant neoplasm of other genital organs: Secondary | ICD-10-CM | POA: Insufficient documentation

## 2019-02-22 DIAGNOSIS — N651 Disproportion of reconstructed breast: Secondary | ICD-10-CM | POA: Diagnosis not present

## 2019-02-22 DIAGNOSIS — Z9882 Breast implant status: Secondary | ICD-10-CM | POA: Diagnosis not present

## 2019-02-22 DIAGNOSIS — Z800 Family history of malignant neoplasm of digestive organs: Secondary | ICD-10-CM | POA: Insufficient documentation

## 2019-02-22 DIAGNOSIS — Z881 Allergy status to other antibiotic agents status: Secondary | ICD-10-CM | POA: Insufficient documentation

## 2019-02-22 DIAGNOSIS — F419 Anxiety disorder, unspecified: Secondary | ICD-10-CM | POA: Insufficient documentation

## 2019-02-22 HISTORY — PX: LIPOSUCTION WITH LIPOFILLING: SHX6436

## 2019-02-22 LAB — POCT PREGNANCY, URINE: Preg Test, Ur: NEGATIVE

## 2019-02-22 SURGERY — LIPOSUCTION, WITH FAT TRANSFER
Anesthesia: General | Site: Breast | Laterality: Bilateral

## 2019-02-22 MED ORDER — LIDOCAINE 2% (20 MG/ML) 5 ML SYRINGE
INTRAMUSCULAR | Status: AC
  Filled 2019-02-22: qty 5

## 2019-02-22 MED ORDER — LIDOCAINE-EPINEPHRINE 1 %-1:100000 IJ SOLN
INTRAMUSCULAR | Status: DC | PRN
  Administered 2019-02-22: 10 mL

## 2019-02-22 MED ORDER — LACTATED RINGERS IV SOLN
INTRAVENOUS | Status: DC
  Administered 2019-02-22 (×2): via INTRAVENOUS

## 2019-02-22 MED ORDER — CEFAZOLIN SODIUM-DEXTROSE 2-4 GM/100ML-% IV SOLN
2.0000 g | INTRAVENOUS | Status: AC
  Administered 2019-02-22: 2 g via INTRAVENOUS

## 2019-02-22 MED ORDER — LIDOCAINE 2% (20 MG/ML) 5 ML SYRINGE
INTRAMUSCULAR | Status: DC | PRN
  Administered 2019-02-22: 60 mg via INTRAVENOUS

## 2019-02-22 MED ORDER — DEXAMETHASONE SODIUM PHOSPHATE 10 MG/ML IJ SOLN
INTRAMUSCULAR | Status: DC | PRN
  Administered 2019-02-22: 10 mg via INTRAVENOUS

## 2019-02-22 MED ORDER — DEXAMETHASONE SODIUM PHOSPHATE 10 MG/ML IJ SOLN
INTRAMUSCULAR | Status: AC
  Filled 2019-02-22: qty 1

## 2019-02-22 MED ORDER — LIDOCAINE HCL 1 % IJ SOLN
INTRAVENOUS | Status: DC | PRN
  Administered 2019-02-22: 1000 mL

## 2019-02-22 MED ORDER — MIDAZOLAM HCL 2 MG/2ML IJ SOLN: 1.0000 mg | INTRAMUSCULAR | Status: DC | PRN

## 2019-02-22 MED ORDER — FENTANYL CITRATE (PF) 100 MCG/2ML IJ SOLN: 50.0000 ug | INTRAMUSCULAR | Status: DC | PRN

## 2019-02-22 MED ORDER — FENTANYL CITRATE (PF) 100 MCG/2ML IJ SOLN
INTRAMUSCULAR | Status: AC
  Filled 2019-02-22: qty 2

## 2019-02-22 MED ORDER — EPHEDRINE SULFATE-NACL 50-0.9 MG/10ML-% IV SOSY
PREFILLED_SYRINGE | INTRAVENOUS | Status: DC | PRN
  Administered 2019-02-22 (×2): 10 mg via INTRAVENOUS

## 2019-02-22 MED ORDER — OXYCODONE HCL 5 MG PO TABS
5.0000 mg | ORAL_TABLET | ORAL | Status: DC | PRN
  Administered 2019-02-22: 17:00:00 5 mg via ORAL

## 2019-02-22 MED ORDER — LACTATED RINGERS IV SOLN
INTRAVENOUS | Status: AC | PRN
  Administered 2019-02-22 (×2): 1000 mL

## 2019-02-22 MED ORDER — SODIUM CHLORIDE 0.9 % IV SOLN: 250.0000 mL | INTRAVENOUS | Status: DC | PRN

## 2019-02-22 MED ORDER — ACETAMINOPHEN 650 MG RE SUPP: 650.0000 mg | RECTAL | Status: DC | PRN

## 2019-02-22 MED ORDER — PROPOFOL 10 MG/ML IV BOLUS
INTRAVENOUS | Status: AC
  Filled 2019-02-22: qty 20

## 2019-02-22 MED ORDER — SODIUM CHLORIDE 0.9% FLUSH: 3.0000 mL | Freq: Two times a day (BID) | INTRAVENOUS | Status: DC

## 2019-02-22 MED ORDER — OXYCODONE HCL 5 MG PO TABS
ORAL_TABLET | ORAL | Status: AC
  Filled 2019-02-22: qty 1

## 2019-02-22 MED ORDER — CEFAZOLIN SODIUM-DEXTROSE 2-4 GM/100ML-% IV SOLN
INTRAVENOUS | Status: AC
  Filled 2019-02-22: qty 100

## 2019-02-22 MED ORDER — FENTANYL CITRATE (PF) 100 MCG/2ML IJ SOLN
25.0000 ug | INTRAMUSCULAR | Status: DC | PRN
  Administered 2019-02-22 (×2): 25 ug via INTRAVENOUS

## 2019-02-22 MED ORDER — ONDANSETRON HCL 4 MG/2ML IJ SOLN
INTRAMUSCULAR | Status: DC | PRN
  Administered 2019-02-22: 4 mg via INTRAVENOUS

## 2019-02-22 MED ORDER — SCOPOLAMINE 1 MG/3DAYS TD PT72: 1.0000 | MEDICATED_PATCH | Freq: Once | TRANSDERMAL | Status: DC

## 2019-02-22 MED ORDER — ONDANSETRON HCL 4 MG/2ML IJ SOLN
INTRAMUSCULAR | Status: AC
  Filled 2019-02-22: qty 2

## 2019-02-22 MED ORDER — SODIUM CHLORIDE 0.9% FLUSH: 3.0000 mL | INTRAVENOUS | Status: DC | PRN

## 2019-02-22 MED ORDER — ACETAMINOPHEN 325 MG PO TABS: 650.0000 mg | ORAL_TABLET | ORAL | Status: DC | PRN

## 2019-02-22 MED ORDER — FENTANYL CITRATE (PF) 100 MCG/2ML IJ SOLN
INTRAMUSCULAR | Status: DC | PRN
  Administered 2019-02-22 (×2): 25 ug via INTRAVENOUS
  Administered 2019-02-22: 50 ug via INTRAVENOUS

## 2019-02-22 MED ORDER — MIDAZOLAM HCL 2 MG/2ML IJ SOLN
INTRAMUSCULAR | Status: AC
  Filled 2019-02-22: qty 2

## 2019-02-22 MED ORDER — MIDAZOLAM HCL 5 MG/5ML IJ SOLN
INTRAMUSCULAR | Status: DC | PRN
  Administered 2019-02-22: 2 mg via INTRAVENOUS

## 2019-02-22 MED ORDER — EPHEDRINE 5 MG/ML INJ
INTRAVENOUS | Status: AC
  Filled 2019-02-22: qty 10

## 2019-02-22 MED ORDER — CHLORHEXIDINE GLUCONATE CLOTH 2 % EX PADS: 6.0000 | MEDICATED_PAD | Freq: Once | CUTANEOUS | Status: DC

## 2019-02-22 MED ORDER — PROPOFOL 10 MG/ML IV BOLUS
INTRAVENOUS | Status: DC | PRN
  Administered 2019-02-22: 200 mg via INTRAVENOUS

## 2019-02-22 SURGICAL SUPPLY — 50 items
BINDER ABDOMINAL  9 SM 30-45 (SOFTGOODS)
BINDER ABDOMINAL 10 UNV 27-48 (MISCELLANEOUS) IMPLANT
BINDER ABDOMINAL 12 SM 30-45 (SOFTGOODS) ×2 IMPLANT
BINDER ABDOMINAL 9 SM 30-45 (SOFTGOODS) IMPLANT
BINDER BREAST LRG (GAUZE/BANDAGES/DRESSINGS) IMPLANT
BINDER BREAST MEDIUM (GAUZE/BANDAGES/DRESSINGS) IMPLANT
BINDER BREAST XLRG (GAUZE/BANDAGES/DRESSINGS) IMPLANT
BINDER BREAST XXLRG (GAUZE/BANDAGES/DRESSINGS) ×2 IMPLANT
BLADE HEX COATED 2.75 (ELECTRODE) IMPLANT
BLADE SURG 15 STRL LF DISP TIS (BLADE) ×1 IMPLANT
BLADE SURG 15 STRL SS (BLADE) ×1
BNDG GAUZE ELAST 4 BULKY (GAUZE/BANDAGES/DRESSINGS) ×4 IMPLANT
CHLORAPREP W/TINT 26 (MISCELLANEOUS) ×2 IMPLANT
COVER BACK TABLE REUSABLE LG (DRAPES) ×2 IMPLANT
COVER MAYO STAND REUSABLE (DRAPES) ×2 IMPLANT
COVER WAND RF STERILE (DRAPES) IMPLANT
DECANTER SPIKE VIAL GLASS SM (MISCELLANEOUS) IMPLANT
DERMABOND ADVANCED (GAUZE/BANDAGES/DRESSINGS) ×1
DERMABOND ADVANCED .7 DNX12 (GAUZE/BANDAGES/DRESSINGS) ×1 IMPLANT
DRAPE LAPAROSCOPIC ABDOMINAL (DRAPES) ×2 IMPLANT
DRSG PAD ABDOMINAL 8X10 ST (GAUZE/BANDAGES/DRESSINGS) ×4 IMPLANT
ELECT REM PT RETURN 9FT ADLT (ELECTROSURGICAL) ×2
ELECTRODE REM PT RTRN 9FT ADLT (ELECTROSURGICAL) ×1 IMPLANT
EXTRACTOR CANIST REVOLVE STRL (CANNISTER) ×4 IMPLANT
GLOVE BIO SURGEON STRL SZ 6.5 (GLOVE) ×10 IMPLANT
GOWN STRL REUS W/ TWL LRG LVL3 (GOWN DISPOSABLE) ×2 IMPLANT
GOWN STRL REUS W/TWL LRG LVL3 (GOWN DISPOSABLE) ×2
IV LACTATED RINGERS 1000ML (IV SOLUTION) ×6 IMPLANT
LINER CANISTER 1000CC FLEX (MISCELLANEOUS) ×2 IMPLANT
NDL SAFETY ECLIPSE 18X1.5 (NEEDLE) ×1 IMPLANT
NEEDLE FILTER BLUNT 18X 1/2SAF (NEEDLE) ×1
NEEDLE FILTER BLUNT 18X1 1/2 (NEEDLE) ×1 IMPLANT
NEEDLE HYPO 18GX1.5 SHARP (NEEDLE) ×1
NEEDLE HYPO 25X1 1.5 SAFETY (NEEDLE) IMPLANT
PACK BASIN DAY SURGERY FS (CUSTOM PROCEDURE TRAY) ×2 IMPLANT
PAD ALCOHOL SWAB (MISCELLANEOUS) ×2 IMPLANT
PENCIL BUTTON HOLSTER BLD 10FT (ELECTRODE) ×2 IMPLANT
SLEEVE SCD COMPRESS KNEE MED (MISCELLANEOUS) ×2 IMPLANT
SPONGE LAP 18X18 RF (DISPOSABLE) ×4 IMPLANT
SUT MNCRL AB 4-0 PS2 18 (SUTURE) IMPLANT
SUT MON AB 5-0 PS2 18 (SUTURE) ×4 IMPLANT
SYR 10ML LL (SYRINGE) ×8 IMPLANT
SYR 3ML 18GX1 1/2 (SYRINGE) IMPLANT
SYR 50ML LL SCALE MARK (SYRINGE) ×4 IMPLANT
SYR CONTROL 10ML LL (SYRINGE) ×2 IMPLANT
SYR TOOMEY 50ML (SYRINGE) ×4 IMPLANT
TOWEL GREEN STERILE FF (TOWEL DISPOSABLE) ×4 IMPLANT
TUBING INFILTRATION IT-10001 (TUBING) IMPLANT
TUBING SET GRADUATE ASPIR 12FT (MISCELLANEOUS) ×2 IMPLANT
UNDERPAD 30X30 (UNDERPADS AND DIAPERS) ×4 IMPLANT

## 2019-02-22 NOTE — Transfer of Care (Signed)
Immediate Anesthesia Transfer of Care Note  Patient: Shannon May  Procedure(s) Performed: LIPOSUCTION WITH LIPOFILLING (Bilateral Breast)  Patient Location: PACU  Anesthesia Type:General  Level of Consciousness: awake, alert  and oriented  Airway & Oxygen Therapy: Patient Spontanous Breathing and Patient connected to face mask oxygen  Post-op Assessment: Report given to RN and Post -op Vital signs reviewed and stable  Post vital signs: Reviewed and stable  Last Vitals:  Vitals Value Taken Time  BP 137/83 02/22/19 1514  Temp    Pulse 86 02/22/19 1515  Resp    SpO2 100 % 02/22/19 1515  Vitals shown include unvalidated device data.  Last Pain:  Vitals:   02/22/19 1122  TempSrc: Oral  PainSc: 0-No pain         Complications: No apparent anesthesia complications

## 2019-02-22 NOTE — Anesthesia Preprocedure Evaluation (Addendum)
Anesthesia Evaluation  Patient identified by MRN, date of birth, ID band Patient awake    Reviewed: Allergy & Precautions, NPO status , Patient's Chart, lab work & pertinent test results, reviewed documented beta blocker date and time   Airway Mallampati: I  TM Distance: >3 FB Neck ROM: Full    Dental no notable dental hx. (+) Teeth Intact, Dental Advisory Given   Pulmonary neg pulmonary ROS, former smoker,    Pulmonary exam normal breath sounds clear to auscultation       Cardiovascular hypertension, Pt. on medications and Pt. on home beta blockers Normal cardiovascular exam Rhythm:Regular Rate:Normal     Neuro/Psych PSYCHIATRIC DISORDERS Anxiety negative neurological ROS     GI/Hepatic negative GI ROS, Neg liver ROS,   Endo/Other  negative endocrine ROS  Renal/GU negative Renal ROS  negative genitourinary   Musculoskeletal negative musculoskeletal ROS (+)   Abdominal   Peds  Hematology negative hematology ROS (+)   Anesthesia Other Findings   Reproductive/Obstetrics                            Anesthesia Physical Anesthesia Plan  ASA: II  Anesthesia Plan: General   Post-op Pain Management:    Induction: Intravenous  PONV Risk Score and Plan: 3 and Midazolam, Ondansetron and Dexamethasone  Airway Management Planned: LMA  Additional Equipment:   Intra-op Plan:   Post-operative Plan: Extubation in OR  Informed Consent: I have reviewed the patients History and Physical, chart, labs and discussed the procedure including the risks, benefits and alternatives for the proposed anesthesia with the patient or authorized representative who has indicated his/her understanding and acceptance.     Dental advisory given  Plan Discussed with: CRNA  Anesthesia Plan Comments:         Anesthesia Quick Evaluation

## 2019-02-22 NOTE — H&P (Signed)
Shannon May is an 48 y.o. female.   Chief Complaint: Breast asymmetry HPI: The patient is a 48 yrs old wf here for history and physical for breast reconstruction.  She had bilateral mastectomies followed by reconstruction with expanders and then implants.  She has done very well with her procedures.  She has mild upper and medial poll loss of volume.  She is interested in symmetry surgery.    Past Medical History:  Diagnosis Date  . Anxiety   . Diverticulosis   . Hypertension 11/08/2017  . Malignant neoplasm of upper-outer quadrant of left breast in female, estrogen receptor positive ([place]) 06/08/2018  . Sinusitis 11/15/2017    Past Surgical History:  Procedure Laterality Date  . BREAST RECONSTRUCTION WITH PLACEMENT OF TISSUE EXPANDER AND ALLODERM N/A 08/14/2018   Procedure: BREAST RECONSTRUCTION WITH PLACEMENT OF TISSUE EXPANDER AND FLEXHD;  Surgeon: Wallace Going, DO;  Location: [facility];  Service: Plastics;  Laterality: N/A;  . BREAST SURGERY    . DILATION AND CURETTAGE OF UTERUS    . LASIK    . LIPOSUCTION Bilateral 11/15/2018   Procedure: LIPOSUCTION;  Surgeon: Wallace Going, DO;  Location: [facility];  Service: Plastics;  Laterality: Bilateral;  . MASTECTOMY W/ SENTINEL NODE BIOPSY Bilateral 08/14/2018   with breast reconstruction  . MASTECTOMY W/ SENTINEL NODE BIOPSY Bilateral 08/14/2018   Procedure: BILATERAL MASTECTOMIES  WITH LEFT SENTINEL LYMPH NODE MAPPING;  Surgeon: Jovita Kussmaul, MD;  Location: [facility];  Service: General;  Laterality: Bilateral;  . REMOVAL OF BILATERAL TISSUE EXPANDERS WITH PLACEMENT OF BILATERAL BREAST IMPLANTS Bilateral 11/15/2018   Procedure: removal of bilateral expanders and placement of bilateral silicone implants.;  Surgeon: Wallace Going, DO;  Location: [place];  Service: Plastics;  Laterality: Bilateral;    Family History  Problem Relation Age of Onset  . Colon cancer Maternal Grandfather   .  Melanoma Paternal Grandfather   . Heart attack Maternal Grandmother   . Hypertension Mother   . Hyperlipidemia Father   . Vaginal cancer Maternal Aunt    Social History:  reports that she quit smoking about 30 years ago. Her smoking use included cigarettes. She has never used smokeless tobacco. She reports current alcohol use of about 3.0 standard drinks of alcohol per week. She reports that she does not use drugs.  Allergies:  Allergies  Allergen Reactions  . Antihistamines, Chlorpheniramine-Type Shortness Of Breath    Makes heart race  . Sudafed [Pseudoephedrine Hcl]     shaking  . Sulfamethoxazole Nausea And Vomiting  . Erythromycin Nausea Only    Medications Prior to Admission  Medication Sig Dispense Refill  . Ascorbic Acid (VITAMIN C PO) Take by mouth.    Mariane Baumgarten Calcium (STOOL SOFTENER PO) Take 1 tablet by mouth 2 (two) times daily.     Marland Kitchen escitalopram (LEXAPRO) 5 MG tablet Take 0.5 tablets (2.5 mg total) by mouth daily. 45 tablet 0  . fluticasone (FLONASE) 50 MCG/ACT nasal spray PLACE 2 SPRAYS INTO THE NOSE DAILY (Patient taking differently: Place 2 sprays into both nostrils daily. ) 16 g 1  . hydrochlorothiazide (MICROZIDE) 12.5 MG capsule TAKE 1 CAPSULE BY MOUTH EVERY DAY 90 capsule 3  . metoprolol succinate (TOPROL-XL) 25 MG 24 hr tablet Take 1 tablet (25 mg total) by mouth daily. 30 tablet 2  . Multiple Vitamins-Minerals (MULTIVITAMIN WOMEN PO) Take 1 capsule by mouth daily.    . Multiple Vitamins-Minerals (ZINC PO) Take by mouth.    Marland Kitchen  Probiotic Product (PRO-BIOTIC BLEND PO) Take 1 tablet by mouth daily.     Marland Kitchen ALPRAZolam (XANAX) 0.5 MG tablet Take 1-2 tablets (0.5-1 mg total) by mouth daily as needed for anxiety. 12 tablet 0    Results for orders placed or performed during the hospital encounter of 02/22/19 (from the past 48 hour(s))  Pregnancy, urine POC     Status: None   Collection Time: 02/22/19 11:09 AM  Result Value Ref Range   Preg Test, Ur NEGATIVE NEGATIVE     Comment:        THE SENSITIVITY OF THIS METHODOLOGY IS >24 mIU/mL    No results found.  Review of Systems  Constitutional: Negative.   HENT: Negative.   Eyes: Negative.   Respiratory: Negative.   Cardiovascular: Negative.   Gastrointestinal: Negative.   Genitourinary: Negative.   Musculoskeletal: Negative.   Skin: Negative.   Neurological: Negative.   Psychiatric/Behavioral: Negative.     Blood pressure 117/77, pulse 67, temperature 98.5 F (36.9 C), temperature source Oral, resp. rate 16, height 5\' 8"  (1.727 m), weight 87.5 kg, SpO2 100 %. Physical Exam  Constitutional: She is oriented to person, place, and time. She appears well-developed and well-nourished.  HENT:  Head: Normocephalic and atraumatic.  Eyes: Pupils are equal, round, and reactive to light.  Cardiovascular: Normal rate.  Respiratory: Effort normal. No respiratory distress.  GI: Soft. She exhibits no distension.  Neurological: She is alert and oriented to person, place, and time.  Skin: Skin is warm.  Psychiatric: She has a normal mood and affect. Her behavior is normal.     Assessment/Plan Plan for lipofilling of bilateral breasts for symmetry.   The risks that can be encountered with and after liposuction were discussed and include the following but no limited to these:  Asymmetry, fluid accumulation, firmness of the area, fat necrosis with death of fat tissue, bleeding, infection, delayed healing, anesthesia risks, skin sensation changes, injury to structures including nerves, blood vessels, and muscles which may be temporary or permanent, allergies to tape, suture materials and glues, blood products, topical preparations or injected agents, skin and contour irregularities, skin discoloration and swelling, deep vein thrombosis, cardiac and pulmonary complications, pain, which may persist, persistent pain, recurrence of the lesion, poor healing of the incision, possible need for revisional surgery or  staged procedures. Thiere can also be persistent swelling, poor wound healing, rippling or loose skin, worsening of cellulite, swelling, and thermal burn or heat injury from ultrasound with the ultrasound-assisted lipoplasty technique. Any change in weight fluctuations can alter the outcome.    [place], DO 02/22/2019, 12:49 PM

## 2019-02-22 NOTE — Op Note (Addendum)
DATE OF OPERATION: 02/22/2019  LOCATION: [facility]  PREOPERATIVE DIAGNOSIS: Breast asymmetry after breast reconstruction for cancer  POSTOPERATIVE DIAGNOSIS: Same  PROCEDURE: Lipofilling of bilateral breasts for symmetry  SURGEON:  [place] , DO  ASSISTANT: [place], RNFA  EBL: none  CONDITION: Stable  COMPLICATIONS: None  INDICATION: The patient, Shannon May, is a 48 y.o. female born on Sep 19, 1970, is here for treatment of bilateral breast reconstruction with lipofilling for breast symmetry.   PROCEDURE DETAILS:  The patient was seen prior to surgery and marked.  The IV antibiotics were given. The patient was taken to the operating room and given a general anesthetic. A standard time out was performed and all information was confirmed by those in the room. SCDs were placed.   The chest and abdomen was prepped and draped.  Local with epinepherine was injected at the umbilical area for intraoperative hemostasis and post operative pain control.  A #15 blade was used to make a 5 mm incision.  The tumescent was infused in the abdominal adipose tissue.   The liposuction was then done and the adipose collected in the Revolve system. The tissue was prepared and then injected into the medial and upper poll of the breasts.  There was 90 cc of fat placed on the left and 90 cc on the right.  Additional liposuction was done to the lateral breast area. The incisions were closed with the 5-0 Monocryl. ABD was placed over the incisions. The patient was placed in a breast binder and abdominal binder. The patient was allowed to wake up and taken to recovery room in stable condition at the end of the case. The family was notified at the end of the case.   The RNFA assisted throughout the case.  The RNFA was essential in retraction and counter traction when needed to make the case progress smoothly.  This retraction and assistance made it possible to see the tissue plans for  the procedure.  The assistance was needed for blood control, tissue re-approximation and assisted with closure of the incision site.

## 2019-02-22 NOTE — Discharge Instructions (Addendum)
INSTRUCTIONS FOR AFTER SURGERY   You are having surgery.  You will likely have some questions about what to expect following your operation.  The following information will help you and your family understand what to expect when you are discharged from the hospital.  Following these guidelines will help ensure a smooth recovery and reduce risks of complications.   Postoperative instructions include information on: diet, wound care, medications and physical activity.  AFTER SURGERY Expect to go home after the procedure.  In some cases, you may need to spend one night in the hospital for observation.  DIET This surgery does not require a specific diet.  However, I have to mention that the healthier you eat the better your body can start healing. It is important to increasing your protein intake.  This means limiting the foods with sugar and carbohydrates.  Focus on vegetables and some meat.  If you have any liposuction during your procedure be sure to drink water.  If your urine is bright yellow, then it is concentrated, and you need to drink more water.  As a general rule after surgery, you should have 8 ounces of water every hour while awake.  If you find you are persistently nauseated or unable to take in liquids let us know.  NO TOBACCO USE or EXPOSURE.  This will slow your healing process and increase the risk of a wound.  WOUND CARE You can shower the day after surgery if you don't have a drain.  Use fragrance free soap.  Dial, [place] and [place] are usually mild on the skin. If you have a drain clean with baby wipes until the drain is removed.  If you have steri-strips / tape directly attached to your skin leave them in place. It is OK to get these wet.  No baths, pools or hot tubs for two weeks. We close your incision to leave the smallest and best-looking scar. No ointment or creams on your incisions until given the go ahead.  Especially not Neosporin (Too many skin reactions with this one).  A  few weeks after surgery you can use Mederma and start massaging the scar. We ask you to wear your binder or sports bra for the first 6 weeks around the clock, including while sleeping. This provides added comfort and helps reduce the fluid accumulation at the surgery site.  ACTIVITY No heavy lifting until cleared by the doctor.  It is OK to walk and climb stairs. In fact, moving your legs is very important to decrease your risk of a blood clot.  It will also help keep you from getting deconditioned.  Every 1 to 2 hours get up and walk for 5 minutes. This will help with a quicker recovery back to normal.  Let pain be your guide so you don't do too much.  NO, you cannot do the spring cleaning and don't plan on taking care of anyone else.  This is your time for TLC.  You will be more comfortable if you sleep and rest with your head elevated either with a few pillows under you or in a recliner.  No stomach sleeping for a few months.  WORK Everyone returns to work at different times. As a rough guide, most people take at least 1 - 2 weeks off prior to returning to work. If you need documentation for your job, bring the forms to your postoperative follow up visit.  DRIVING Arrange for someone to bring you home from the hospital.  You  may be able to drive a few days after surgery but not while taking any narcotics or valium.  BOWEL MOVEMENTS Constipation can occur after anesthesia and while taking pain medication.  It is important to stay ahead for your comfort.  We recommend taking Milk of Magnesia (2 tablespoons; twice a day) while taking the pain pills.  SEROMA This is fluid your body tried to put in the surgical site.  This is normal but if it creates tight skinny skin let us know.  It usually decreases in a few weeks.  WHEN TO CALL Call your surgeon's office if any of the following occur:  Fever 101 degrees F or greater  Excessive bleeding or fluid from the incision site.  Pain that increases  over time without aid from the medications  Redness, warmth, or pus draining from incision sites  Persistent nausea or inability to take in liquids  Severe misshapen area that underwent the operation.  ]      Post Anesthesia Home Care Instructions  Activity: Get plenty of rest for the remainder of the day. A responsible individual must stay with you for 24 hours following the procedure.  For the next 24 hours, DO NOT: -Drive a car -Paediatric nurse -Drink alcoholic beverages -Take any medication unless instructed by your physician -Make any legal decisions or sign important papers.  Meals: Start with liquid foods such as gelatin or soup. Progress to regular foods as tolerated. Avoid greasy, spicy, heavy foods. If nausea and/or vomiting occur, drink only clear liquids until the nausea and/or vomiting subsides. Call your physician if vomiting continues.  Special Instructions/Symptoms: Your throat may feel dry or sore from the anesthesia or the breathing tube placed in your throat during surgery. If this causes discomfort, gargle with warm salt water. The discomfort should disappear within 24 hours.  If you had a scopolamine patch placed behind your ear for the management of post- operative nausea and/or vomiting:  1. The medication in the patch is effective for 72 hours, after which it should be removed.  Wrap patch in a tissue and discard in the trash. Wash hands thoroughly with soap and water. 2. You may remove the patch earlier than 72 hours if you experience unpleasant side effects which may include dry mouth, dizziness or visual disturbances. 3. Avoid touching the patch. Wash your hands with soap and water after contact with the patch.

## 2019-02-22 NOTE — Anesthesia Postprocedure Evaluation (Signed)
Anesthesia Post Note  Patient: Shannon May  Procedure(s) Performed: LIPOSUCTION WITH LIPOFILLING (Bilateral Breast)     Patient location during evaluation: PACU Anesthesia Type: General Level of consciousness: awake and alert Pain management: pain level controlled Vital Signs Assessment: post-procedure vital signs reviewed and stable Respiratory status: spontaneous breathing, nonlabored ventilation, respiratory function stable and patient connected to nasal cannula oxygen Cardiovascular status: blood pressure returned to baseline and stable Postop Assessment: no apparent nausea or vomiting Anesthetic complications: no    Last Vitals:  Vitals:   02/22/19 1611 02/22/19 1615  BP:    Pulse:  72  Resp:  15  Temp:    SpO2: 100% 97%    Last Pain:  Vitals:   02/22/19 1611  TempSrc:   PainSc: 4                  Leocadio Heal L [name]

## 2019-02-22 NOTE — Anesthesia Procedure Notes (Signed)
Procedure Name: LMA Insertion Date/Time: 02/22/2019 1:37 PM Performed by: Genelle Bal, CRNA Pre-anesthesia Checklist: Patient identified, Emergency Drugs available, Suction available and Patient being monitored Patient Re-evaluated:Patient Re-evaluated prior to induction Oxygen Delivery Method: Circle system utilized Preoxygenation: Pre-oxygenation with 100% oxygen Induction Type: IV induction Ventilation: Mask ventilation without difficulty LMA: LMA inserted LMA Size: 4.0 Number of attempts: 1 Airway Equipment and Method: Bite block Placement Confirmation: positive ETCO2 Tube secured with: Tape Dental Injury: Teeth and Oropharynx as per pre-operative assessment

## 2019-02-23 ENCOUNTER — Encounter (HOSPITAL_BASED_OUTPATIENT_CLINIC_OR_DEPARTMENT_OTHER): Payer: Self-pay | Admitting: Plastic Surgery

## 2019-02-23 ENCOUNTER — Telehealth: Payer: Self-pay | Admitting: Plastic Surgery

## 2019-02-23 MED ORDER — HYDROCODONE-ACETAMINOPHEN 5-325 MG PO TABS: 1.0000 | ORAL_TABLET | Freq: Four times a day (QID) | ORAL | 0 refills | Status: AC | PRN

## 2019-02-23 NOTE — Addendum Note (Signed)
Addendum  created 02/23/19 1012 by Money Mckeithan, Ernesta Amble, CRNA   Charge Capture section accepted

## 2019-02-23 NOTE — Addendum Note (Signed)
Addended by: Wallace Going on: 02/23/2019 10:04 AM   Modules accepted: Orders

## 2019-02-23 NOTE — Telephone Encounter (Signed)
Called and spoke with the patient regarding the message below.  Informed the patient that I spoke with Dr.  Marla Roe and she said she's not sure what is causing the numbness with her tongue.  It could be she's not drinking enough water.  Make sure she's drinking 8 ounces of water every hour.  If her urine is not clear she's not drinking enough.  Patient stated that she is drinking a lot of water,but she's wondering if something may have happened when she received the anesthesia.  Informed her that Dr. Marla Roe said for the breast she will need to wear either the binder or sports bra, and for her stomach she will need to wear the girdle or spanx.  And she will be wearing them for a while.   Patient verbalized understanding and agreed.//AB/CMA

## 2019-02-23 NOTE — Telephone Encounter (Signed)
Received call from patient in regards to procedure performed yesterday. Patient states that she is experiencing some numbness of her tongue that she has not had before after anesthesia. Patient also has questions about the girdle/binding that was used for the procedure and how long that it needs to be worn.

## 2019-02-28 ENCOUNTER — Ambulatory Visit: Payer: BC Managed Care – PPO | Admitting: Plastic Surgery

## 2019-03-01 ENCOUNTER — Ambulatory Visit: Payer: BC Managed Care – PPO | Admitting: Psychology

## 2019-03-02 ENCOUNTER — Ambulatory Visit (INDEPENDENT_AMBULATORY_CARE_PROVIDER_SITE_OTHER): Payer: BC Managed Care – PPO | Admitting: Psychology

## 2019-03-02 DIAGNOSIS — F064 Anxiety disorder due to known physiological condition: Secondary | ICD-10-CM | POA: Diagnosis not present

## 2019-03-05 ENCOUNTER — Telehealth: Payer: Self-pay | Admitting: Plastic Surgery

## 2019-03-05 NOTE — Telephone Encounter (Signed)

## 2019-03-06 ENCOUNTER — Other Ambulatory Visit: Payer: Self-pay

## 2019-03-06 ENCOUNTER — Ambulatory Visit (INDEPENDENT_AMBULATORY_CARE_PROVIDER_SITE_OTHER): Payer: BC Managed Care – PPO | Admitting: Surgical

## 2019-03-06 ENCOUNTER — Encounter: Payer: Self-pay | Admitting: Surgical

## 2019-03-06 VITALS — BP 145/83 | HR 71 | Temp 98.1°F | Ht 67.0 in | Wt 196.4 lb

## 2019-03-06 DIAGNOSIS — Z9889 Other specified postprocedural states: Secondary | ICD-10-CM

## 2019-03-06 NOTE — Progress Notes (Signed)
   Subjective:     Patient ID: Shannon May, female    DOB: 1971-07-06, 48 y.o.   MRN: [id number]  Chief Complaint  Patient presents with  . Follow-up    for liposuction with lipofilling    HPI: The patient is a 48 y.o. yrs old female here for follow-up after liposuction of the abdomen with likely filling of bilateral breasts.  She has been doing well with minimal pain.  She is no longer taking any medication for pain other than an occasional Tylenol as needed.  Her pain today in the office is a 1 out of 10.  The only time she elicits pain above a 1 out of 10 is during bending over to put on socks or shave etc, which occurs in her lower abdomen.  She did report that after surgery she had numbness in her tongue, this has resolved.  Her incisions are healing well and the bruising throughout her abdomen and breasts has yellowed and is starting to dissipate.  Overall, she is doing very well.  Review of Systems  Constitutional: Negative for chills, fever and malaise/fatigue.  HENT: Negative.   Respiratory: Negative.   Cardiovascular: Negative.   Skin: Negative for itching and rash.       Bruising   Neurological: Negative for dizziness, weakness and headaches.  Psychiatric/Behavioral: Negative.      Objective:   Vital Signs BP (!) 145/83 (BP Location: Right Arm, Patient Position: Sitting, Cuff Size: Normal)   Pulse 71   Temp 98.1 F (36.7 C) (Oral)   Ht 5\' 7"  (1.702 m)   Wt 196 lb 6.4 oz (89.1 kg)   SpO2 98%   BMI 30.76 kg/m  Vital Signs and Nursing Note Reviewed  Physical Exam  Constitutional: She is oriented to person, place, and time and well-developed, well-nourished, and in no distress. No distress.  HENT:  Head: Normocephalic and atraumatic.  Neck: Normal range of motion.  Cardiovascular: Normal rate.  Pulmonary/Chest: Effort normal.  Abdominal: Soft. Bowel sounds are normal. She exhibits no distension. There is abdominal tenderness. There is no guarding.   Musculoskeletal: Normal range of motion.  Neurological: She is alert and oriented to person, place, and time. Gait normal.  Skin: Skin is warm and dry. She is not diaphoretic. No erythema.  Bruising noted on B/L Breasts, abdomen.       Assessment/Plan:     ICD-10-CM   1. S/P breast reconstruction, bilateral  Z98.890     Shannon May is doing well.  She has minimal pain at this time and her incisions from surgery are healing well.  There appears to be no signs of infection or fluid accumulation at this time.  She continues to wear her compression bra and abdominal binder, continue this for a few more weeks.  She can begin walking for exercise at her normal rate and swelling if she would like.  Overall she is doing very well, she will follow-up with [place] in 3 months.  Informed to call or return if she has any questions or concerns.   Follow up in 3 months.  Carola Rhine Yula Crotwell, PA-C 03/06/2019, 2:29 PM

## 2019-03-07 ENCOUNTER — Other Ambulatory Visit: Payer: Self-pay | Admitting: Family Medicine

## 2019-03-16 ENCOUNTER — Ambulatory Visit: Payer: BC Managed Care – PPO | Admitting: Psychology

## 2019-03-19 ENCOUNTER — Encounter: Payer: Self-pay | Admitting: Plastic Surgery

## 2019-03-20 ENCOUNTER — Telehealth: Payer: Self-pay

## 2019-03-20 MED ORDER — CEPHALEXIN 500 MG PO CAPS: 500.0000 mg | ORAL_CAPSULE | Freq: Four times a day (QID) | ORAL | 0 refills | Status: AC

## 2019-03-20 NOTE — Telephone Encounter (Signed)
Call back to pt re: c/o the followqing syptoms: Increased redness surrounding the umbilicus, "foul" smelling drainage, & increased tenderness She denies any fever or chills Photo was sent by pt to my chart- Dr. Marla Roe did see photo & determined that she needed an antibiotic- she will order that Pt is advised to call back for any concerns- & she will call back after finishing the antibiotic & will give [place] an update on her condition- & we will schedule a f/u visit as needed Pt agrees with plan of care [facility]

## 2019-03-30 ENCOUNTER — Ambulatory Visit (INDEPENDENT_AMBULATORY_CARE_PROVIDER_SITE_OTHER): Payer: BC Managed Care – PPO | Admitting: Psychology

## 2019-03-30 DIAGNOSIS — F064 Anxiety disorder due to known physiological condition: Secondary | ICD-10-CM | POA: Diagnosis not present

## 2019-04-04 ENCOUNTER — Encounter: Payer: Self-pay | Admitting: Family Medicine

## 2019-04-05 MED ORDER — ESCITALOPRAM OXALATE 5 MG PO TABS: 5.0000 mg | ORAL_TABLET | Freq: Every day | ORAL | 1 refills | Status: DC

## 2019-04-12 ENCOUNTER — Ambulatory Visit (INDEPENDENT_AMBULATORY_CARE_PROVIDER_SITE_OTHER): Payer: BC Managed Care – PPO | Admitting: Psychology

## 2019-04-12 DIAGNOSIS — F064 Anxiety disorder due to known physiological condition: Secondary | ICD-10-CM | POA: Diagnosis not present

## 2019-04-20 ENCOUNTER — Ambulatory Visit: Payer: BC Managed Care – PPO | Admitting: Plastic Surgery

## 2019-04-23 ENCOUNTER — Ambulatory Visit: Payer: BC Managed Care – PPO | Admitting: Psychology

## 2019-05-07 ENCOUNTER — Ambulatory Visit (INDEPENDENT_AMBULATORY_CARE_PROVIDER_SITE_OTHER): Payer: BC Managed Care – PPO | Admitting: Psychology

## 2019-05-07 DIAGNOSIS — F064 Anxiety disorder due to known physiological condition: Secondary | ICD-10-CM

## 2019-05-15 ENCOUNTER — Telehealth: Payer: Self-pay

## 2019-05-15 ENCOUNTER — Ambulatory Visit: Payer: BC Managed Care – PPO | Admitting: Family Medicine

## 2019-05-15 NOTE — Progress Notes (Deleted)
Established Patient Office Visit  Subjective:  Patient ID: Shannon May, female    DOB: Dec 28, 1970  Age: 48 y.o. MRN: [id number]  CC: No chief complaint on file.   HPI DANNAPAOLA HOLLOMAN presents for   Hypertension- Pt denies chest pain, SOB, dizziness, or heart palpitations.  Taking meds as directed w/o problems.  Denies medication side effects.      Past Medical History:  Diagnosis Date  . Anxiety   . Diverticulosis   . Hypertension 11/08/2017  . Malignant neoplasm of upper-outer quadrant of left breast in female, estrogen receptor positive ([place]) 06/08/2018  . Sinusitis 11/15/2017    Past Surgical History:  Procedure Laterality Date  . BREAST RECONSTRUCTION WITH PLACEMENT OF TISSUE EXPANDER AND ALLODERM N/A 08/14/2018   Procedure: BREAST RECONSTRUCTION WITH PLACEMENT OF TISSUE EXPANDER AND FLEXHD;  Surgeon: Wallace Going, DO;  Location: [place];  Service: Plastics;  Laterality: N/A;  . BREAST SURGERY    . DILATION AND CURETTAGE OF UTERUS    . LASIK    . LIPOSUCTION Bilateral 11/15/2018   Procedure: LIPOSUCTION;  Surgeon: Wallace Going, DO;  Location: [place];  Service: Plastics;  Laterality: Bilateral;  . LIPOSUCTION WITH LIPOFILLING Bilateral 02/22/2019   Procedure: LIPOSUCTION WITH LIPOFILLING;  Surgeon: Wallace Going, DO;  Location: [place];  Service: Plastics;  Laterality: Bilateral;  2 hours, please  . MASTECTOMY W/ SENTINEL NODE BIOPSY Bilateral 08/14/2018   with breast reconstruction  . MASTECTOMY W/ SENTINEL NODE BIOPSY Bilateral 08/14/2018   Procedure: BILATERAL MASTECTOMIES  WITH LEFT SENTINEL LYMPH NODE MAPPING;  Surgeon: Jovita Kussmaul, MD;  Location: [place];  Service: General;  Laterality: Bilateral;  . REMOVAL OF BILATERAL TISSUE EXPANDERS WITH PLACEMENT OF BILATERAL BREAST IMPLANTS Bilateral 11/15/2018   Procedure: removal of bilateral expanders and placement of bilateral silicone implants.;  Surgeon:  Wallace Going, DO;  Location: [place];  Service: Plastics;  Laterality: Bilateral;    Family History  Problem Relation Age of Onset  . Colon cancer Maternal Grandfather   . Melanoma Paternal Grandfather   . Heart attack Maternal Grandmother   . Hypertension Mother   . Hyperlipidemia Father   . Vaginal cancer Maternal Aunt     Social History   Socioeconomic History  . Marital status: Married    Spouse name: Ronalee Belts  . Number of children: 3  . Years of education: Masters  . Highest education level: Not on file  Occupational History  . Occupation: Education officer, museum    Comment: [place]    Employer: [employer]  . Financial resource strain: Not on file  . Food insecurity    Worry: Not on file    Inability: Not on file  . Transportation needs    Medical: No    Non-medical: No  Tobacco Use  . Smoking status: Former Smoker    Types: Cigarettes    Quit date: 06/13/1988    Years since quitting: 30.9  . Smokeless tobacco: Never Used  . Tobacco comment: She smoked a couple of months in her teens.   Substance and Sexual Activity  . Alcohol use: Yes    Alcohol/week: 3.0 standard drinks    Types: 3 Glasses of wine per week  . Drug use: No  . Sexual activity: Yes    Partners: Male  Lifestyle  . Physical activity    Days per week: Not on file    Minutes per  session: Not on file  . Stress: Not on file  Relationships  . Social Herbalist on phone: Not on file    Gets together: Not on file    Attends religious service: Not on file    Active member of club or organization: Not on file    Attends meetings of clubs or organizations: Not on file    Relationship status: Not on file  . Intimate partner violence    Fear of current or ex partner: No    Emotionally abused: No    Physically abused: No    Forced sexual activity: No  Other Topics Concern  . Not on file  Social History Narrative   Treadmill 5 days per week 2  caffeine drinks per day.     Outpatient Medications Prior to Visit  Medication Sig Dispense Refill  . ALPRAZolam (XANAX) 0.5 MG tablet Take 1-2 tablets (0.5-1 mg total) by mouth daily as needed for anxiety. 12 tablet 0  . Ascorbic Acid (VITAMIN C PO) Take by mouth.    Mariane Baumgarten Calcium (STOOL SOFTENER PO) Take 1 tablet by mouth 2 (two) times daily.     Marland Kitchen escitalopram (LEXAPRO) 5 MG tablet Take 1 tablet (5 mg total) by mouth daily. 90 tablet 1  . fluticasone (FLONASE) 50 MCG/ACT nasal spray PLACE 2 SPRAYS INTO THE NOSE DAILY (Patient taking differently: Place 2 sprays into both nostrils daily. ) 16 g 1  . hydrochlorothiazide (MICROZIDE) 12.5 MG capsule TAKE 1 CAPSULE BY MOUTH EVERY DAY 90 capsule 3  . metoprolol succinate (TOPROL-XL) 25 MG 24 hr tablet TAKE 1 TABLET BY MOUTH EVERY DAY 90 tablet 0  . Multiple Vitamins-Minerals (MULTIVITAMIN WOMEN PO) Take 1 capsule by mouth daily.    . Multiple Vitamins-Minerals (ZINC PO) Take by mouth.    . Probiotic Product (PRO-BIOTIC BLEND PO) Take 1 tablet by mouth daily.      No facility-administered medications prior to visit.     Allergies  Allergen Reactions  . Antihistamines, Chlorpheniramine-Type Shortness Of Breath    Makes heart race  . Sudafed [Pseudoephedrine Hcl]     shaking  . Sulfamethoxazole Nausea And Vomiting  . Erythromycin Nausea Only    ROS Review of Systems    Objective:    Physical Exam  There were no vitals taken for this visit. Wt Readings from Last 3 Encounters:  03/06/19 196 lb 6.4 oz (89.1 kg)  02/22/19 192 lb 14.4 oz (87.5 kg)  02/20/19 192 lb (87.1 kg)     Health Maintenance Due  Topic Date Due  . COLONOSCOPY  03/22/2019  . INFLUENZA VACCINE  04/07/2019    There are no preventive care reminders to display for this patient.  Lab Results  Component Value Date   TSH 3.73 12/08/2016   Lab Results  Component Value Date   WBC 6.2 08/08/2018   HGB 13.5 08/08/2018   HCT 40.7 08/08/2018   MCV 96.9  08/08/2018   PLT 231 08/08/2018   Lab Results  Component Value Date   NA 138 02/19/2019   K 4.4 02/19/2019   CO2 27 02/19/2019   GLUCOSE 106 (H) 02/19/2019   BUN 9 02/19/2019   CREATININE 0.62 02/19/2019   BILITOT 0.4 12/02/2017   ALKPHOS 59 12/08/2016   AST 16 12/02/2017   ALT 14 12/02/2017   PROT 6.8 12/02/2017   ALBUMIN 4.4 12/08/2016   CALCIUM 9.4 02/19/2019   ANIONGAP 9 02/19/2019   Lab Results  Component Value  Date   CHOL 196 12/02/2017   Lab Results  Component Value Date   HDL 52 12/02/2017   Lab Results  Component Value Date   LDLCALC 127 (H) 12/02/2017   Lab Results  Component Value Date   TRIG 71 12/02/2017   Lab Results  Component Value Date   CHOLHDL 3.8 12/02/2017   No results found for: HGBA1C    Assessment & Plan:   Problem List Items Addressed This Visit      Cardiovascular and Mediastinum   Hypertension - Primary      No orders of the defined types were placed in this encounter.   Follow-up: No follow-ups on file.    Beatrice Lecher, MD

## 2019-05-15 NOTE — Telephone Encounter (Signed)
Call to pt re: pt wants advice re: 3-D or 4-D nipple/areola reconstruction She would like to know if 4-D would be an option following 3-D tattoo-if she would like projection of the nipple I discussed this with Dr. Marla Roe & she agreed that you can attempt 3-D tattooing first & the re-create a nipple projection from the skin later  Pt will think about both options - then when she returns for her f/u in oct.- she would discuss these further with Dr. Marla Roe then She is reminded to call for any other questions or concerns- [place]

## 2019-05-31 ENCOUNTER — Telehealth: Payer: BC Managed Care – PPO | Admitting: Family Medicine

## 2019-06-06 ENCOUNTER — Other Ambulatory Visit: Payer: Self-pay | Admitting: Family Medicine

## 2019-06-07 ENCOUNTER — Ambulatory Visit: Payer: BC Managed Care – PPO | Admitting: Family Medicine

## 2019-06-07 ENCOUNTER — Encounter: Payer: Self-pay | Admitting: Family Medicine

## 2019-06-07 ENCOUNTER — Other Ambulatory Visit: Payer: Self-pay

## 2019-06-07 VITALS — BP 109/64 | HR 65 | Ht 67.0 in | Wt 206.0 lb

## 2019-06-07 DIAGNOSIS — M542 Cervicalgia: Secondary | ICD-10-CM

## 2019-06-07 DIAGNOSIS — Z23 Encounter for immunization: Secondary | ICD-10-CM

## 2019-06-07 DIAGNOSIS — F439 Reaction to severe stress, unspecified: Secondary | ICD-10-CM | POA: Diagnosis not present

## 2019-06-07 DIAGNOSIS — I10 Essential (primary) hypertension: Secondary | ICD-10-CM

## 2019-06-07 MED ORDER — LISINOPRIL 10 MG PO TABS: 5.0000 mg | ORAL_TABLET | Freq: Every day | ORAL | 0 refills | Status: DC

## 2019-06-07 NOTE — Progress Notes (Signed)
Established Patient Office Visit  Subjective:  Patient ID: Shannon May, female    DOB: 12-06-1970  Age: 48 y.o. MRN: [id number]  CC:  Chief Complaint  Patient presents with  . Hypertension    HPI TORAH MORONE presents for   Hypertension- Pt denies chest pain, SOB, dizziness, or heart palpitations.  Taking meds as directed w/o problems.  She has cut back on her metoprolol because it was slowing her pulse too much to the point that she was starting to feel a little tired.  She thinks she is ready to go ahead and switch to an ACE inhibitor as we had discussed initially.  Is been a year since she was diagnosed with breast cancer and she is finally completed her breast reconstruction.  She is actually doing really well overall and has been happy with the results.  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  She reporst her colonocopy is up to date.  Reports that she had it done in [facility] through [facility].  In fact she is had a couple of colonoscopies.  She is also been dealing with a lot of neck pain.  She says she is pretty sure it is just from working on the computer screen because she is been teaching virtually she finds her self sort of leaning into the screen to be closer to the kids and then particularly by the end of the day her neck is just really painful she is been using Tylenol and a heating pad.  She thinks she like to start with some chiropractic care.  In regards to mood she is actually doing really well.  She wants to continue her Lexapro for now.   Past Medical History:  Diagnosis Date  . Anxiety   . Diverticulosis   . Hypertension 11/08/2017  . Malignant neoplasm of upper-outer quadrant of left breast in female, estrogen receptor positive ([place]) 06/08/2018  . Sinusitis 11/15/2017    Past Surgical History:  Procedure Laterality Date  . BREAST RECONSTRUCTION WITH PLACEMENT OF TISSUE EXPANDER AND ALLODERM N/A 08/14/2018   Procedure: BREAST  RECONSTRUCTION WITH PLACEMENT OF TISSUE EXPANDER AND FLEXHD;  Surgeon: Wallace Going, DO;  Location: [place];  Service: Plastics;  Laterality: N/A;  . BREAST SURGERY    . DILATION AND CURETTAGE OF UTERUS    . LASIK    . LIPOSUCTION Bilateral 11/15/2018   Procedure: LIPOSUCTION;  Surgeon: Wallace Going, DO;  Location: [place];  Service: Plastics;  Laterality: Bilateral;  . LIPOSUCTION WITH LIPOFILLING Bilateral 02/22/2019   Procedure: LIPOSUCTION WITH LIPOFILLING;  Surgeon: Wallace Going, DO;  Location: [place];  Service: Plastics;  Laterality: Bilateral;  2 hours, please  . MASTECTOMY W/ SENTINEL NODE BIOPSY Bilateral 08/14/2018   with breast reconstruction  . MASTECTOMY W/ SENTINEL NODE BIOPSY Bilateral 08/14/2018   Procedure: BILATERAL MASTECTOMIES  WITH LEFT SENTINEL LYMPH NODE MAPPING;  Surgeon: Jovita Kussmaul, MD;  Location: [place];  Service: General;  Laterality: Bilateral;  . REMOVAL OF BILATERAL TISSUE EXPANDERS WITH PLACEMENT OF BILATERAL BREAST IMPLANTS Bilateral 11/15/2018   Procedure: removal of bilateral expanders and placement of bilateral silicone implants.;  Surgeon: Wallace Going, DO;  Location: [place];  Service: Plastics;  Laterality: Bilateral;    Family History  Problem Relation Age of Onset  . Colon cancer Maternal Grandfather   . Melanoma Paternal Grandfather   . Heart attack Maternal Grandmother   .  Hypertension Mother   . Hyperlipidemia Father   . Vaginal cancer Maternal Aunt     Social History   Socioeconomic History  . Marital status: Married    Spouse name: Ronalee Belts  . Number of children: 3  . Years of education: Masters  . Highest education level: Not on file  Occupational History  . Occupation: Education officer, museum    Comment: [employer]    Employer: [employer]  . Financial resource strain: Not on file  . Food insecurity    Worry: Not on file    Inability:  Not on file  . Transportation needs    Medical: No    Non-medical: No  Tobacco Use  . Smoking status: Former Smoker    Types: Cigarettes    Quit date: 06/13/1988    Years since quitting: 31.0  . Smokeless tobacco: Never Used  . Tobacco comment: She smoked a couple of months in her teens.   Substance and Sexual Activity  . Alcohol use: Yes    Alcohol/week: 3.0 standard drinks    Types: 3 Glasses of wine per week  . Drug use: No  . Sexual activity: Yes    Partners: Male  Lifestyle  . Physical activity    Days per week: Not on file    Minutes per session: Not on file  . Stress: Not on file  Relationships  . Social Herbalist on phone: Not on file    Gets together: Not on file    Attends religious service: Not on file    Active member of club or organization: Not on file    Attends meetings of clubs or organizations: Not on file    Relationship status: Not on file  . Intimate partner violence    Fear of current or ex partner: No    Emotionally abused: No    Physically abused: No    Forced sexual activity: No  Other Topics Concern  . Not on file  Social History Narrative   Treadmill 5 days per week 2 caffeine drinks per day.     Outpatient Medications Prior to Visit  Medication Sig Dispense Refill  . ALPRAZolam (XANAX) 0.5 MG tablet Take 1-2 tablets (0.5-1 mg total) by mouth daily as needed for anxiety. 12 tablet 0  . Ascorbic Acid (VITAMIN C PO) Take by mouth.    Mariane Baumgarten Calcium (STOOL SOFTENER PO) Take 1 tablet by mouth 2 (two) times daily.     Marland Kitchen escitalopram (LEXAPRO) 5 MG tablet Take 1 tablet (5 mg total) by mouth daily. 90 tablet 1  . fluticasone (FLONASE) 50 MCG/ACT nasal spray PLACE 2 SPRAYS INTO THE NOSE DAILY (Patient taking differently: Place 2 sprays into both nostrils daily. ) 16 g 1  . hydrochlorothiazide (MICROZIDE) 12.5 MG capsule TAKE 1 CAPSULE BY MOUTH EVERY DAY 90 capsule 3  . Multiple Vitamins-Minerals (MULTIVITAMIN WOMEN PO) Take 1 capsule  by mouth daily.    . Multiple Vitamins-Minerals (ZINC PO) Take by mouth.    . Probiotic Product (PRO-BIOTIC BLEND PO) Take 1 tablet by mouth daily.     . metoprolol succinate (TOPROL-XL) 25 MG 24 hr tablet TAKE 1 TABLET BY MOUTH EVERY DAY (Patient taking differently: Take 12.5 mg by mouth daily. ) 90 tablet 0   No facility-administered medications prior to visit.     Allergies  Allergen Reactions  . Antihistamines, Chlorpheniramine-Type Shortness Of Breath    Makes heart race  . Sudafed [  Pseudoephedrine Hcl]     shaking  . Sulfamethoxazole Nausea And Vomiting  . Erythromycin Nausea Only    ROS Review of Systems    Objective:    Physical Exam  Constitutional: She is oriented to person, place, and time. She appears well-developed and well-nourished.  HENT:  Head: Normocephalic and atraumatic.  Cardiovascular: Normal rate, regular rhythm and normal heart sounds.  Pulmonary/Chest: Effort normal and breath sounds normal.  Neurological: She is alert and oriented to person, place, and time.  Skin: Skin is warm and dry.  Psychiatric: She has a normal mood and affect. Her behavior is normal.    BP 109/64   Pulse 65   Ht 5\' 7"  (1.702 m)   Wt 206 lb (93.4 kg)   SpO2 100%   BMI 32.26 kg/m  Wt Readings from Last 3 Encounters:  06/07/19 206 lb (93.4 kg)  03/06/19 196 lb 6.4 oz (89.1 kg)  02/22/19 192 lb 14.4 oz (87.5 kg)     Health Maintenance Due  Topic Date Due  . COLONOSCOPY  03/22/2019    There are no preventive care reminders to display for this patient.  Lab Results  Component Value Date   TSH 3.73 12/08/2016   Lab Results  Component Value Date   WBC 6.2 08/08/2018   HGB 13.5 08/08/2018   HCT 40.7 08/08/2018   MCV 96.9 08/08/2018   PLT 231 08/08/2018   Lab Results  Component Value Date   NA 138 02/19/2019   K 4.4 02/19/2019   CO2 27 02/19/2019   GLUCOSE 106 (H) 02/19/2019   BUN 9 02/19/2019   CREATININE 0.62 02/19/2019   BILITOT 0.4 12/02/2017    ALKPHOS 59 12/08/2016   AST 16 12/02/2017   ALT 14 12/02/2017   PROT 6.8 12/02/2017   ALBUMIN 4.4 12/08/2016   CALCIUM 9.4 02/19/2019   ANIONGAP 9 02/19/2019   Lab Results  Component Value Date   CHOL 196 12/02/2017   Lab Results  Component Value Date   HDL 52 12/02/2017   Lab Results  Component Value Date   LDLCALC 127 (H) 12/02/2017   Lab Results  Component Value Date   TRIG 71 12/02/2017   Lab Results  Component Value Date   CHOLHDL 3.8 12/02/2017   No results found for: HGBA1C    Assessment & Plan:   Problem List Items Addressed This Visit      Cardiovascular and Mediastinum   Hypertension - Primary    Well controlled.  She has had some low pulse with the metoprolol so we are going to discontinue that now and switch her to lisinopril.  She can start with half a tab and then if needed go up to a whole tab.  Monitor blood pressures at home and if she has any problems or concerns then let us know.  Continue current regimen. Follow up in  6 mo      Relevant Medications   lisinopril (ZESTRIL) 10 MG tablet   Other Relevant Orders   CBC   COMPLETE METABOLIC PANEL WITH GFR   Lipid panel   TSH     Other   Stress    Doing really well overall.  Would like to continue her Lexapro for now.  Will address at follow-up.  If she still doing well then consider tapering off at that time.       Other Visit Diagnoses    Need for immunization against influenza       Relevant Orders  Flu Vaccine QUAD 36+ mos IM (Completed)   Cervical pain         Cervical pain-some relief with Tylenol and heating pad I think chiropractic care would be a great idea if she needs an official referral just let us know.  Gave her some recommendations here locally.  If that is not improving and helping then consider further work-up and possible formal physical therapy evaluation.  Meds ordered this encounter  Medications  . lisinopril (ZESTRIL) 10 MG tablet    Sig: Take 0.5-1 tablets (5-10  mg total) by mouth daily.    Dispense:  90 tablet    Refill:  0    Follow-up: Return in about 6 months (around 12/06/2019) for Hypertension.    Beatrice Lecher, MD

## 2019-06-08 ENCOUNTER — Encounter: Payer: Self-pay | Admitting: Family Medicine

## 2019-06-08 ENCOUNTER — Telehealth: Payer: Self-pay | Admitting: Family Medicine

## 2019-06-08 ENCOUNTER — Ambulatory Visit: Payer: BC Managed Care – PPO | Admitting: Plastic Surgery

## 2019-06-08 DIAGNOSIS — F439 Reaction to severe stress, unspecified: Secondary | ICD-10-CM | POA: Insufficient documentation

## 2019-06-08 NOTE — Telephone Encounter (Signed)
Records request sent.Shannon May, Shannon May, CMA

## 2019-06-08 NOTE — Assessment & Plan Note (Signed)
Doing really well overall.  Would like to continue her Lexapro for now.  Will address at follow-up.  If she still doing well then consider tapering off at that time.

## 2019-06-08 NOTE — Telephone Encounter (Signed)
Please fax request for colonoscopy report from March 11, 2016 with gastroenterology in [facility].  I can see the date in care everywhere but do not have the actual report.  Please call so that we can get this scanned in.

## 2019-06-08 NOTE — Assessment & Plan Note (Addendum)
Well controlled.  She has had some low pulse with the metoprolol so we are going to discontinue that now and switch her to lisinopril.  She can start with half a tab and then if needed go up to a whole tab.  Monitor blood pressures at home and if she has any problems or concerns then let us know.  Continue current regimen. Follow up in  6 mo

## 2019-06-13 ENCOUNTER — Ambulatory Visit: Payer: BC Managed Care – PPO | Admitting: Psychology

## 2019-06-14 ENCOUNTER — Ambulatory Visit (INDEPENDENT_AMBULATORY_CARE_PROVIDER_SITE_OTHER): Payer: BC Managed Care – PPO | Admitting: Psychology

## 2019-06-14 DIAGNOSIS — F064 Anxiety disorder due to known physiological condition: Secondary | ICD-10-CM

## 2019-07-02 ENCOUNTER — Ambulatory Visit (INDEPENDENT_AMBULATORY_CARE_PROVIDER_SITE_OTHER): Payer: BC Managed Care – PPO | Admitting: Psychology

## 2019-07-02 DIAGNOSIS — F064 Anxiety disorder due to known physiological condition: Secondary | ICD-10-CM | POA: Diagnosis not present

## 2019-07-04 ENCOUNTER — Encounter: Payer: Self-pay | Admitting: Family Medicine

## 2019-07-06 ENCOUNTER — Encounter: Payer: Self-pay | Admitting: Plastic Surgery

## 2019-07-06 ENCOUNTER — Ambulatory Visit (INDEPENDENT_AMBULATORY_CARE_PROVIDER_SITE_OTHER): Payer: BC Managed Care – PPO | Admitting: Plastic Surgery

## 2019-07-06 ENCOUNTER — Other Ambulatory Visit: Payer: Self-pay

## 2019-07-06 VITALS — BP 109/73 | HR 79 | Temp 97.5°F | Ht 67.0 in | Wt 210.8 lb

## 2019-07-06 DIAGNOSIS — Z9013 Acquired absence of bilateral breasts and nipples: Secondary | ICD-10-CM

## 2019-07-06 MED ORDER — ACYCLOVIR 5 % EX OINT: 1.0000 "application " | TOPICAL_OINTMENT | CUTANEOUS | 3 refills | Status: DC

## 2019-07-06 NOTE — Progress Notes (Signed)
Patient ID: Shannon May, female    DOB: 09/26/1970, 48 y.o.   MRN: [id number]   Chief Complaint  Patient presents with  . Follow-up    3 mos    The patient is a 48 year old female who had bilateral mastectomies.  She is here for 71-month follow-up.  She had expanders and then implant placement.  She had lipophilic of the upper pole.  Today she looks great.  She is very pleased with her results.  There are no areas of concern.  Her incisions are healing well.  She has no lumps or bumps.  She still interested in tattoos but thinks she is going to go with [facility].  She is probably can have a tattoo artist do it.   Review of Systems  Constitutional: Negative.  Negative for activity change.  HENT: Negative.   Eyes: Negative.   Respiratory: Negative for chest tightness.   Cardiovascular: Negative.   Gastrointestinal: Negative.  Negative for abdominal pain.  Endocrine: Negative.   Genitourinary: Negative.   Musculoskeletal: Negative.   Hematological: Negative.   Psychiatric/Behavioral: Negative.     Past Medical History:  Diagnosis Date  . Anxiety   . Diverticulosis   . Hypertension 11/08/2017  . Malignant neoplasm of upper-outer quadrant of left breast in female, estrogen receptor positive ([place]) 06/08/2018  . Sinusitis 11/15/2017    Past Surgical History:  Procedure Laterality Date  . BREAST RECONSTRUCTION WITH PLACEMENT OF TISSUE EXPANDER AND ALLODERM N/A 08/14/2018   Procedure: BREAST RECONSTRUCTION WITH PLACEMENT OF TISSUE EXPANDER AND FLEXHD;  Surgeon: Wallace Going, DO;  Location: [facility];  Service: Plastics;  Laterality: N/A;  . BREAST SURGERY    . DILATION AND CURETTAGE OF UTERUS    . LASIK    . LIPOSUCTION Bilateral 11/15/2018   Procedure: LIPOSUCTION;  Surgeon: Wallace Going, DO;  Location: [place];  Service: Plastics;  Laterality: Bilateral;  . LIPOSUCTION WITH LIPOFILLING Bilateral 02/22/2019   Procedure: LIPOSUCTION WITH LIPOFILLING;   Surgeon: Wallace Going, DO;  Location: [place] [facility];  Service: Plastics;  Laterality: Bilateral;  2 hours, please  . MASTECTOMY W/ SENTINEL NODE BIOPSY Bilateral 08/14/2018   with breast reconstruction  . MASTECTOMY W/ SENTINEL NODE BIOPSY Bilateral 08/14/2018   Procedure: BILATERAL MASTECTOMIES  WITH LEFT SENTINEL LYMPH NODE MAPPING;  Surgeon: Jovita Kussmaul, MD;  Location: [facility];  Service: General;  Laterality: Bilateral;  . REMOVAL OF BILATERAL TISSUE EXPANDERS WITH PLACEMENT OF BILATERAL BREAST IMPLANTS Bilateral 11/15/2018   Procedure: removal of bilateral expanders and placement of bilateral silicone implants.;  Surgeon: Wallace Going, DO;  Location: [facility];  Service: Plastics;  Laterality: Bilateral;      Current Outpatient Medications:  .  acyclovir ointment (ZOVIRAX) 5 %, Apply 1 application topically every 3 (three) hours., Disp: 15 g, Rfl: 3 .  ALPRAZolam (XANAX) 0.5 MG tablet, Take 1-2 tablets (0.5-1 mg total) by mouth daily as needed for anxiety., Disp: 12 tablet, Rfl: 0 .  Ascorbic Acid (VITAMIN C PO), Take by mouth., Disp: , Rfl:  .  Docusate Calcium (STOOL SOFTENER PO), Take 1 tablet by mouth 2 (two) times daily. , Disp: , Rfl:  .  escitalopram (LEXAPRO) 5 MG tablet, Take 1 tablet (5 mg total) by mouth daily., Disp: 90 tablet, Rfl: 1 .  fluticasone (FLONASE) 50 MCG/ACT nasal spray, PLACE 2 SPRAYS INTO THE NOSE DAILY (Patient taking differently: Place 2 sprays into both nostrils daily. ),  Disp: 16 g, Rfl: 1 .  hydrochlorothiazide (MICROZIDE) 12.5 MG capsule, TAKE 1 CAPSULE BY MOUTH EVERY DAY, Disp: 90 capsule, Rfl: 3 .  lisinopril (ZESTRIL) 10 MG tablet, Take 0.5-1 tablets (5-10 mg total) by mouth daily., Disp: 90 tablet, Rfl: 0 .  Multiple Vitamins-Minerals (MULTIVITAMIN WOMEN PO), Take 1 capsule by mouth daily., Disp: , Rfl:  .  Multiple Vitamins-Minerals (ZINC PO), Take by mouth., Disp: , Rfl:  .  Probiotic Product (PRO-BIOTIC BLEND  PO), Take 1 tablet by mouth daily. , Disp: , Rfl:    Objective:   Vitals:   07/06/19 1337  BP: 109/73  Pulse: 79  Temp: (!) 97.5 F (36.4 C)  SpO2: 97%    Physical Exam Vitals signs and nursing note reviewed.  Constitutional:      Appearance: Normal appearance.  HENT:     Head: Normocephalic and atraumatic.  Cardiovascular:     Rate and Rhythm: Normal rate.     Pulses: Normal pulses.  Pulmonary:     Effort: Pulmonary effort is normal.  Abdominal:     General: Abdomen is flat. There is no distension.  Skin:    General: Skin is warm.  Neurological:     General: No focal deficit present.     Mental Status: She is alert and oriented to person, place, and time. Mental status is at baseline.  Psychiatric:        Mood and Affect: Mood normal.        Behavior: Behavior normal.        Thought Content: Thought content normal.        Judgment: Judgment normal.     Assessment & Plan:  Acquired absence of both breasts  S/P mastectomy, bilateral 1 year follow-up, continue massage.  Continue sports bra. Pictures were obtained of the patient and placed in the chart with the patient's or guardian's permission. Call with any questions or concerns.  [place], DO

## 2019-07-26 ENCOUNTER — Ambulatory Visit: Payer: BC Managed Care – PPO | Admitting: Psychology

## 2019-07-27 ENCOUNTER — Ambulatory Visit (INDEPENDENT_AMBULATORY_CARE_PROVIDER_SITE_OTHER): Payer: BC Managed Care – PPO | Admitting: Psychology

## 2019-07-27 DIAGNOSIS — F064 Anxiety disorder due to known physiological condition: Secondary | ICD-10-CM

## 2019-07-30 ENCOUNTER — Encounter: Payer: Self-pay | Admitting: Family Medicine

## 2019-07-30 ENCOUNTER — Ambulatory Visit (INDEPENDENT_AMBULATORY_CARE_PROVIDER_SITE_OTHER): Payer: BC Managed Care – PPO | Admitting: Family Medicine

## 2019-07-30 VITALS — BP 132/79 | Temp 99.0°F

## 2019-07-30 DIAGNOSIS — Z20828 Contact with and (suspected) exposure to other viral communicable diseases: Secondary | ICD-10-CM

## 2019-07-30 DIAGNOSIS — R05 Cough: Secondary | ICD-10-CM | POA: Diagnosis not present

## 2019-07-30 DIAGNOSIS — R52 Pain, unspecified: Secondary | ICD-10-CM | POA: Diagnosis not present

## 2019-07-30 DIAGNOSIS — Z20822 Contact with and (suspected) exposure to covid-19: Secondary | ICD-10-CM

## 2019-07-30 DIAGNOSIS — R059 Cough, unspecified: Secondary | ICD-10-CM

## 2019-07-30 NOTE — Addendum Note (Signed)
Addended by: Towana Badger on: 07/30/2019 02:50 PM   Modules accepted: Orders

## 2019-07-30 NOTE — Progress Notes (Addendum)
Virtual Visit via Video Note  I connected with Shannon May on 07/30/19 at 11:30 AM EST by a video enabled telemedicine application and verified that I am speaking with the correct person using two identifiers.   I discussed the limitations of evaluation and management by telemedicine and the availability of in person appointments. The patient expressed understanding and agreed to proceed.  Subjective:    CC: Cough and body aches  HPI: Cough started on Thursday, 5 days ago. She stated that it is a non productive dry cough that causes her throat to be sore and keeps her up at night. She took some cough tabs which helped her. She has been using ricola cough drops and drinking more water. Taking tylenol for body aches in her knees, ankles and elbows.She reports that her grandson had a cough and runny nose. No facial pain. No loss of taste or smell. No nausea, vomiting or diarrhea.  No chills.  She has had some low grade temp as well.  + fatigue.    He has been using her husband's Tessalon Perles and that does seem to help some.  Past medical history, Surgical history, Family history not pertinant except as noted below, Social history, Allergies, and medications have been entered into the medical record, reviewed, and corrections made.   Review of Systems: No fevers, chills, night sweats, weight loss, chest pain, or shortness of breath.   Objective:    General: Speaking clearly in complete sentences without any shortness of breath.  Alert and oriented x3.  Normal judgment. No apparent acute distress.  She did cough during the visit and it did sound more dry.    Impression and Recommendations:   Cough and bodyaches - likely viral syndrome.  Recommend that she get tested for Covid especially before the holidays as she is supposed to be around some family members that are coming in town.  Discussed the importance of limiting exposure and getting tested just to make sure before the holidays  that she does not have Covid.  In the meantime recommend symptomatic care.  Hydrate well.  Okay to use Tylenol or Motrin as needed for fever and pain relief.  Try to get rest and adequate sleep.  If not improved after the weekend then please give [place] call back.     I discussed the assessment and treatment plan with the patient. The patient was provided an opportunity to ask questions and all were answered. The patient agreed with the plan and demonstrated an understanding of the instructions.   The patient was advised to call back or seek an in-person evaluation if the symptoms worsen or if the condition fails to improve as anticipated.   Beatrice Lecher, MD

## 2019-07-30 NOTE — Progress Notes (Signed)
Cough started on Thursday. She stated that it is a non productive dry cough that causes her throat to be sore and keeps her up at night. She took some cough tabs which helped her. She has been using ricola cough drops and drinking more water. Taking tylenol for fevers. Body aches in her knees, ankles and elbows.  She reports that her grandson had a cough and runny nose.Shannon May

## 2019-08-01 LAB — NOVEL CORONAVIRUS, NAA: SARS-CoV-2, NAA: NOT DETECTED

## 2019-08-06 ENCOUNTER — Ambulatory Visit (INDEPENDENT_AMBULATORY_CARE_PROVIDER_SITE_OTHER): Payer: BC Managed Care – PPO | Admitting: Physician Assistant

## 2019-08-06 ENCOUNTER — Encounter: Payer: Self-pay | Admitting: Physician Assistant

## 2019-08-06 VITALS — BP 133/83 | HR 62 | Temp 97.1°F | Ht 67.5 in | Wt 209.0 lb

## 2019-08-06 DIAGNOSIS — R05 Cough: Secondary | ICD-10-CM

## 2019-08-06 DIAGNOSIS — R058 Other specified cough: Secondary | ICD-10-CM

## 2019-08-06 MED ORDER — HYDROCODONE-HOMATROPINE 5-1.5 MG/5ML PO SYRP: ORAL_SOLUTION | ORAL | 0 refills | Status: DC

## 2019-08-06 MED ORDER — BENZONATATE 200 MG PO CAPS: 200.0000 mg | ORAL_CAPSULE | Freq: Two times a day (BID) | ORAL | 0 refills | Status: DC | PRN

## 2019-08-06 MED ORDER — PREDNISONE 50 MG PO TABS: ORAL_TABLET | ORAL | 0 refills | Status: DC

## 2019-08-06 NOTE — Progress Notes (Deleted)
  Persistent Dry Cough - has been going on for 1.5 weeks Just "doesn't feel good" Not as bad as last week Throat hurting last night Chest not has heavy as last week  Cough medication - took husband's but its expired  Last week 3 days viral.   Consider ace cough.   Note for work go back on Wednesday.

## 2019-08-07 NOTE — Progress Notes (Signed)
Patient ID: Shannon May, female   DOB: 07-Nov-1970, 48 y.o.   MRN: [id number] .Shannon May KitchenVirtual Visit via Video Note  I connected with Shannon May on 08/06/2019 at  2:20 PM EST by a video enabled telemedicine application and verified that I am speaking with the correct person using two identifiers.  Location: Patient: home Provider: clinic   I discussed the limitations of evaluation and management by telemedicine and the availability of in person appointments. The patient expressed understanding and agreed to proceed.  History of Present Illness: Patient is a 48 year old female who calls into the clinic with dry hacking cough for the last week and a half.  Patient denies any fever, chills, body aches, loss of smell or taste, GI symptoms, wheezing, shortness of breath.  Early on last week she did have a fever and body aches for 2 days but that resolved. She is left with a cough and "not feeling good". The cough is keeping her up at night. She is not able to go to work. covid test was negative. No sick contacts in home. No wheezing or SOB. She is taking tessalon pearls but expired from last year. She is taking OTC cough syrup.   She does mention a few weeks ago being started on lisinopril.    .. Active Ambulatory Problems    Diagnosis Date Noted  . ALLERGIC RHINITIS 05/09/2008  . DERMATITIS, 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/06/2010  . HEEL PAIN, RIGHT 03/12/2009  . HEADACHE 04/06/2010  . Verruca pedis 11/30/2012  . Left subacromial bursitis 11/30/2012  . Plantar fasciitis 05/23/2014  . Lumbar radiculitis 05/23/2014  . Venous stasis 05/13/2016  . Hypertension 11/08/2017  . Nodule of ear canal, bilateral 11/15/2017  . Symptomatic mammary hypertrophy 06/08/2018  . Malignant neoplasm of upper-outer quadrant of left breast in female, estrogen receptor positive ([place]) 06/08/2018  . 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 Breast cancer ([place]) 08/14/2018  . Acquired absence of breast  08/22/2018  . S/P mastectomy, bilateral 09/15/2018  . S/P breast reconstruction, bilateral 12/22/2018  . Stress 06/08/2019   Resolved Ambulatory Problems    Diagnosis Date Noted  . Carbuncle and furuncle of trunk 03/19/2010  . Abdominal pain, acute, epigastric 01/26/2016  . Sinusitis 11/15/2017   Past Medical History:  Diagnosis Date  . Anxiety   . Diverticulosis    Reviewed med, allergy, problem list.    Observations/Objective: No acute distress.  Dry hacking cough on exam.  Normal appearance and mood.  No labored breathing.  No wheezing.   .. Today's Vitals   08/06/19 1341  BP: 133/83  Pulse: 62  Temp: (!) 97.1 F (36.2 C)  TempSrc: Oral  Weight: 209 lb (94.8 kg)  Height: 5' 7.5" (1.715 m)   Body mass index is 32.25 kg/m.    Assessment and Plan: Shannon May KitchenMarland KitchenIvett was seen today for cough.  Diagnoses and all orders for this visit:  Post-viral cough syndrome -     benzonatate (TESSALON) 200 MG capsule; Take 1 capsule (200 mg total) by mouth 2 (two) times daily as needed for cough. -     predniSONE (DELTASONE) 50 MG tablet; One tab PO daily for 5 days. -     HYDROcodone-homatropine (HYCODAN) 5-1.5 MG/5ML syrup; Take 52mL up to twice a day for cough.   Exact etiology of cough is unclear.  She was Covid negative.  She did have some viral symptoms early last week.  This could represent a viral cough.  I do not see any  overwhelming bacterial signs for infection.  Sent prednisone, Tessalon perles for cough during the day and Hycodan for at night.  Rest and hydrate.  Written out of work to go back on Wednesday.  Follow-up with any worsening signs or symptoms.  She was just recently started on lisinopril.  Certainly there could be an ACE related cough variant.  If this above treatment does not work we need to consider discontinuing lisinopril and switching out for an ARB.  Follow-up as needed.   Follow Up Instructions:    I discussed the assessment and treatment plan with the  patient. The patient was provided an opportunity to ask questions and all were answered. The patient agreed with the plan and demonstrated an understanding of the instructions.   The patient was advised to call back or seek an in-person evaluation if the symptoms worsen or if the condition fails to improve as anticipated.     Iran Planas, PA-C

## 2019-08-13 ENCOUNTER — Ambulatory Visit (INDEPENDENT_AMBULATORY_CARE_PROVIDER_SITE_OTHER): Payer: BC Managed Care – PPO | Admitting: Psychology

## 2019-08-13 DIAGNOSIS — F064 Anxiety disorder due to known physiological condition: Secondary | ICD-10-CM | POA: Diagnosis not present

## 2019-08-28 ENCOUNTER — Telehealth: Payer: Self-pay | Admitting: Family Medicine

## 2019-08-28 NOTE — Telephone Encounter (Signed)
Patient called and she reports that she seen Shannon May a few weeks ago for cough. She states that she is still having a dry cough and Shannon May told her if the cough was still present she may be allergic to the ACE inhibitor that she is on at this time. The patient wants to know if she needs an office visit to change the medication. She is having a dry cough but nothing coming up for a week or two but no other symptoms. Please advise.

## 2019-08-29 MED ORDER — LOSARTAN POTASSIUM 50 MG PO TABS: 50.0000 mg | ORAL_TABLET | Freq: Every day | ORAL | 1 refills | Status: DC

## 2019-08-29 NOTE — Telephone Encounter (Signed)
Patient is aware of the medication change and will make a follow up with front. Sent to scheduling.

## 2019-08-29 NOTE — Telephone Encounter (Signed)
Ok.  Some people do experience cough on ACE inhibitor. It is not an allergic reaction but is just a side effect. Will change to losartan instead and see if cough resoles.  Have her f/u with me in about 1 month to see how she is doing on new med and if cough is better.

## 2019-09-06 ENCOUNTER — Other Ambulatory Visit: Payer: Self-pay | Admitting: Family Medicine

## 2019-09-12 ENCOUNTER — Ambulatory Visit (INDEPENDENT_AMBULATORY_CARE_PROVIDER_SITE_OTHER): Payer: BC Managed Care – PPO | Admitting: Psychology

## 2019-09-12 DIAGNOSIS — F064 Anxiety disorder due to known physiological condition: Secondary | ICD-10-CM

## 2019-09-20 ENCOUNTER — Other Ambulatory Visit: Payer: Self-pay | Admitting: Family Medicine

## 2019-09-29 ENCOUNTER — Other Ambulatory Visit: Payer: Self-pay | Admitting: Family Medicine

## 2019-10-02 ENCOUNTER — Encounter: Payer: Self-pay | Admitting: Family Medicine

## 2019-10-02 ENCOUNTER — Ambulatory Visit (INDEPENDENT_AMBULATORY_CARE_PROVIDER_SITE_OTHER): Payer: BC Managed Care – PPO | Admitting: Family Medicine

## 2019-10-02 ENCOUNTER — Other Ambulatory Visit: Payer: Self-pay

## 2019-10-02 VITALS — BP 131/82 | HR 76 | Ht 68.0 in | Wt 220.0 lb

## 2019-10-02 DIAGNOSIS — I10 Essential (primary) hypertension: Secondary | ICD-10-CM

## 2019-10-02 DIAGNOSIS — R05 Cough: Secondary | ICD-10-CM

## 2019-10-02 DIAGNOSIS — Z20822 Contact with and (suspected) exposure to covid-19: Secondary | ICD-10-CM

## 2019-10-02 DIAGNOSIS — R058 Other specified cough: Secondary | ICD-10-CM

## 2019-10-02 NOTE — Assessment & Plan Note (Signed)
Well controlled. Continue current regimen. Follow up in  6 months. Due for labs.   

## 2019-10-02 NOTE — Progress Notes (Signed)
Established Patient Office Visit  Subjective:  Patient ID: Shannon May, female    DOB: 06-Mar-1971  Age: 49 y.o. MRN: [id number]  CC:  Chief Complaint  Patient presents with  . Hypertension    HPI TEIGHLOR EHLER presents for   Hypertension-she is doing well on the losartan.  Recently she had an upper respiratory infection and had a persistent cough.  We felt like maybe changing her ACE inhibitor to an ARB might be helpful.  She says the cough is significantly better but still will have an occasional coughing fit especially at night.  She says it she noticed her blood pressures went up a little bit after the initial change but over the last couple of weeks of blood pressures have normalized again.  He would really like to have Covid antibody testing done.  She says she was around several family members that tested positive and she was sick herself back in the end of November but tested negative at that time. Then about 3 weeks ago she sat right beside her daughter-in-law on the couch who the following day tested positive for Covid.   Past Medical History:  Diagnosis Date  . Anxiety   . Diverticulosis   . Hypertension 11/08/2017  . Malignant neoplasm of upper-outer quadrant of left breast in female, estrogen receptor positive ([place]) 06/08/2018  . Sinusitis 11/15/2017    Past Surgical History:  Procedure Laterality Date  . BREAST RECONSTRUCTION WITH PLACEMENT OF TISSUE EXPANDER AND ALLODERM N/A 08/14/2018   Procedure: BREAST RECONSTRUCTION WITH PLACEMENT OF TISSUE EXPANDER AND FLEXHD;  Surgeon: Wallace Going, DO;  Location: [facility];  Service: Plastics;  Laterality: N/A;  . BREAST SURGERY    . DILATION AND CURETTAGE OF UTERUS    . LASIK    . LIPOSUCTION Bilateral 11/15/2018   Procedure: LIPOSUCTION;  Surgeon: Wallace Going, DO;  Location: [facility];  Service: Plastics;  Laterality: Bilateral;  . LIPOSUCTION WITH LIPOFILLING Bilateral 02/22/2019    Procedure: LIPOSUCTION WITH LIPOFILLING;  Surgeon: Wallace Going, DO;  Location: [facility];  Service: Plastics;  Laterality: Bilateral;  2 hours, please  . MASTECTOMY W/ SENTINEL NODE BIOPSY Bilateral 08/14/2018   with breast reconstruction  . MASTECTOMY W/ SENTINEL NODE BIOPSY Bilateral 08/14/2018   Procedure: BILATERAL MASTECTOMIES  WITH LEFT SENTINEL LYMPH NODE MAPPING;  Surgeon: Jovita Kussmaul, MD;  Location: [facility];  Service: General;  Laterality: Bilateral;  . REMOVAL OF BILATERAL TISSUE EXPANDERS WITH PLACEMENT OF BILATERAL BREAST IMPLANTS Bilateral 11/15/2018   Procedure: removal of bilateral expanders and placement of bilateral silicone implants.;  Surgeon: Wallace Going, DO;  Location: [facility];  Service: Plastics;  Laterality: Bilateral;    Family History  Problem Relation Age of Onset  . Colon cancer Maternal Grandfather   . Melanoma Paternal Grandfather   . Heart attack Maternal Grandmother   . Hypertension Mother   . Hyperlipidemia Father   . Vaginal cancer Maternal Aunt     Social History   Socioeconomic History  . Marital status: Married    Spouse name: Ronalee Belts  . Number of children: 3  . Years of education: Masters  . Highest education level: Not on file  Occupational History  . Occupation: Education officer, museum    Comment: [employer]    Employer: [employer]  Tobacco Use  . Smoking status: Former Smoker    Types: Cigarettes    Quit date: 06/13/1988    Years  since quitting: 31.3  . Smokeless tobacco: Never Used  . Tobacco comment: She smoked a couple of months in her teens.   Substance and Sexual Activity  . Alcohol use: Yes    Alcohol/week: 3.0 standard drinks    Types: 3 Glasses of wine per week  . Drug use: No  . Sexual activity: Yes    Partners: Male  Other Topics Concern  . Not on file  Social History Narrative   Treadmill 5 days per week 2 caffeine drinks per day.    Social Determinants of Health    Financial Resource Strain:   . Difficulty of Paying Living Expenses: Not on file  Food Insecurity:   . Worried About Charity fundraiser in the Last Year: Not on file  . Ran Out of Food in the Last Year: Not on file  Transportation Needs:   . Lack of Transportation (Medical): Not on file  . Lack of Transportation (Non-Medical): Not on file  Physical Activity:   . Days of Exercise per Week: Not on file  . Minutes of Exercise per Session: Not on file  Stress:   . Feeling of Stress : Not on file  Social Connections:   . Frequency of Communication with Friends and Family: Not on file  . Frequency of Social Gatherings with Friends and Family: Not on file  . Attends Religious Services: Not on file  . Active Member of Clubs or Organizations: Not on file  . Attends Archivist Meetings: Not on file  . Marital Status: Not on file  Intimate Partner Violence:   . Fear of Current or Ex-Partner: Not on file  . Emotionally Abused: Not on file  . Physically Abused: Not on file  . Sexually Abused: Not on file    Outpatient Medications Prior to Visit  Medication Sig Dispense Refill  . loratadine (CLARITIN) 10 MG tablet Take 10 mg by mouth daily as needed for allergies.    . ALPRAZolam (XANAX) 0.5 MG tablet Take 1-2 tablets (0.5-1 mg total) by mouth daily as needed for anxiety. 12 tablet 0  . Ascorbic Acid (VITAMIN C PO) Take by mouth.    Mariane Baumgarten Calcium (STOOL SOFTENER PO) Take 1 tablet by mouth 2 (two) times daily.     Marland Kitchen escitalopram (LEXAPRO) 5 MG tablet Take 1 tablet (5 mg total) by mouth daily. 90 tablet 1  . fluticasone (FLONASE) 50 MCG/ACT nasal spray PLACE 2 SPRAYS INTO THE NOSE DAILY (Patient taking differently: Place 2 sprays into both nostrils daily. ) 16 g 1  . hydrochlorothiazide (MICROZIDE) 12.5 MG capsule TAKE 1 CAPSULE BY MOUTH EVERY DAY 90 capsule 3  . losartan (COZAAR) 50 MG tablet TAKE 1 TABLET BY MOUTH EVERY DAY 30 tablet 1  . Multiple Vitamins-Minerals  (MULTIVITAMIN WOMEN PO) Take 1 capsule by mouth daily.    . Multiple Vitamins-Minerals (ZINC PO) Take by mouth.    . Probiotic Product (PRO-BIOTIC BLEND PO) Take 1 tablet by mouth daily.     Marland Kitchen acyclovir ointment (ZOVIRAX) 5 % Apply 1 application topically every 3 (three) hours. 15 g 3   No facility-administered medications prior to visit.    Allergies  Allergen Reactions  . Antihistamines, Chlorpheniramine-Type Shortness Of Breath    Makes heart race  . Sudafed [Pseudoephedrine Hcl]     shaking  . Sulfamethoxazole Nausea And Vomiting  . Erythromycin Nausea Only    ROS Review of Systems    Objective:    Physical Exam  Constitutional: She is oriented to person, place, and time. She appears well-developed and well-nourished.  HENT:  Head: Normocephalic and atraumatic.  Cardiovascular: Normal rate, regular rhythm and normal heart sounds.  Pulmonary/Chest: Effort normal and breath sounds normal.  Neurological: She is alert and oriented to person, place, and time.  Skin: Skin is warm and dry.  Psychiatric: She has a normal mood and affect. Her behavior is normal.    BP 131/82   Pulse 76   Ht 5\' 8"  (1.727 m)   Wt 220 lb (99.8 kg)   SpO2 99%   BMI 33.45 kg/m  Wt Readings from Last 3 Encounters:  10/02/19 220 lb (99.8 kg)  08/06/19 209 lb (94.8 kg)  07/06/19 210 lb 12.8 oz (95.6 kg)     There are no preventive care reminders to display for this patient.  There are no preventive care reminders to display for this patient.  Lab Results  Component Value Date   TSH 3.73 12/08/2016   Lab Results  Component Value Date   WBC 6.2 08/08/2018   HGB 13.5 08/08/2018   HCT 40.7 08/08/2018   MCV 96.9 08/08/2018   PLT 231 08/08/2018   Lab Results  Component Value Date   NA 138 02/19/2019   K 4.4 02/19/2019   CO2 27 02/19/2019   GLUCOSE 106 (H) 02/19/2019   BUN 9 02/19/2019   CREATININE 0.62 02/19/2019   BILITOT 0.4 12/02/2017   ALKPHOS 59 12/08/2016   AST 16  12/02/2017   ALT 14 12/02/2017   PROT 6.8 12/02/2017   ALBUMIN 4.4 12/08/2016   CALCIUM 9.4 02/19/2019   ANIONGAP 9 02/19/2019   Lab Results  Component Value Date   CHOL 196 12/02/2017   Lab Results  Component Value Date   HDL 52 12/02/2017   Lab Results  Component Value Date   LDLCALC 127 (H) 12/02/2017   Lab Results  Component Value Date   TRIG 71 12/02/2017   Lab Results  Component Value Date   CHOLHDL 3.8 12/02/2017   No results found for: HGBA1C    Assessment & Plan:   Problem List Items Addressed This Visit      Cardiovascular and Mediastinum   Hypertension - Primary    Well controlled. Continue current regimen. Follow up in  6 months.  Due for labs.         Other Visit Diagnoses    Exposure to COVID-19 virus       Relevant Orders   SAR CoV2 Serology (COVID 19)AB(IGG)IA   Post-viral cough syndrome         Exposure to Covid-we will go ahead and get Covid antibodies.  Post viral cough syndrome-it sounds like that the cough may have really been related to postinfectious causes and probably not the ACE inhibitor but thus far she is doing fine on the ARB so we will continue with that for now.  Cough is not completely resolved but significantly better so just gave her reassurance that it may just take a little bit longer time to continue to improve.  Lung exam is actually clear today.  No orders of the defined types were placed in this encounter.   Follow-up: Return in about 6 months (around 03/31/2020) for Hypertension.    Beatrice Lecher, MD

## 2019-10-03 ENCOUNTER — Ambulatory Visit: Payer: BC Managed Care – PPO | Admitting: Family Medicine

## 2019-10-03 LAB — COMPLETE METABOLIC PANEL WITH GFR
AG Ratio: 1.5 (calc) (ref 1.0–2.5)
ALT: 14 U/L (ref 6–29)
AST: 17 U/L (ref 10–35)
Albumin: 4.3 g/dL (ref 3.6–5.1)
Alkaline phosphatase (APISO): 68 U/L (ref 37–153)
BUN: 16 mg/dL (ref 7–25)
CO2: 26 mmol/L (ref 20–32)
Calcium: 9.6 mg/dL (ref 8.6–10.4)
Chloride: 101 mmol/L (ref 98–110)
Creat: 0.81 mg/dL (ref 0.50–1.05)
GFR, Est African American: 98 mL/min/{1.73_m2} (ref >=60)
GFR, Est Non African American: 85 mL/min/{1.73_m2} (ref >=60)
Globulin: 2.8 g/dL (calc) (ref 1.9–3.7)
Glucose, Bld: 114 mg/dL — ABNORMAL HIGH (ref 65–99)
Potassium: 3.7 mmol/L (ref 3.5–5.3)
Sodium: 139 mmol/L (ref 135–146)
Total Bilirubin: 0.4 mg/dL (ref 0.2–1.2)
Total Protein: 7.1 g/dL (ref 6.1–8.1)

## 2019-10-03 LAB — CBC
HCT: 41.1 % (ref 35.0–45.0)
Hemoglobin: 14 g/dL (ref 11.7–15.5)
MCH: 32.5 pg (ref 27.0–33.0)
MCHC: 34.1 g/dL (ref 32.0–36.0)
MCV: 95.4 fL (ref 80.0–100.0)
MPV: 10.9 fL (ref 7.5–12.5)
Platelets: 317 10*3/uL (ref 140–400)
RBC: 4.31 10*6/uL (ref 3.80–5.10)
RDW: 11.7 % (ref 11.0–15.0)
WBC: 7.8 10*3/uL (ref 3.8–10.8)

## 2019-10-03 LAB — LIPID PANEL
Cholesterol: 205 mg/dL — ABNORMAL HIGH (ref <200)
HDL: 45 mg/dL — ABNORMAL LOW (ref >=50)
LDL Cholesterol (Calc): 126 mg/dL (calc) — ABNORMAL HIGH
Non-HDL Cholesterol (Calc): 160 mg/dL (calc) — ABNORMAL HIGH (ref <130)
Total CHOL/HDL Ratio: 4.6 (calc) (ref <5.0)
Triglycerides: 203 mg/dL — ABNORMAL HIGH (ref <150)

## 2019-10-03 LAB — SAR COV2 SEROLOGY (COVID19)AB(IGG),IA: SARS CoV2 AB IGG: NEGATIVE

## 2019-10-03 LAB — TSH: TSH: 3.15 mIU/L

## 2019-10-09 ENCOUNTER — Other Ambulatory Visit: Payer: Self-pay | Admitting: Family Medicine

## 2019-10-10 ENCOUNTER — Ambulatory Visit (INDEPENDENT_AMBULATORY_CARE_PROVIDER_SITE_OTHER): Payer: BC Managed Care – PPO | Admitting: Psychology

## 2019-10-10 DIAGNOSIS — F064 Anxiety disorder due to known physiological condition: Secondary | ICD-10-CM | POA: Diagnosis not present

## 2019-10-25 ENCOUNTER — Other Ambulatory Visit: Payer: Self-pay | Admitting: Family Medicine

## 2019-11-07 ENCOUNTER — Ambulatory Visit: Payer: BC Managed Care – PPO | Admitting: Psychology

## 2019-11-07 ENCOUNTER — Ambulatory Visit (INDEPENDENT_AMBULATORY_CARE_PROVIDER_SITE_OTHER): Payer: BC Managed Care – PPO | Admitting: Psychology

## 2019-11-07 DIAGNOSIS — F064 Anxiety disorder due to known physiological condition: Secondary | ICD-10-CM

## 2019-11-21 ENCOUNTER — Encounter: Payer: Self-pay | Admitting: Family Medicine

## 2019-11-30 ENCOUNTER — Encounter: Payer: BC Managed Care – PPO | Admitting: Family Medicine

## 2019-12-04 ENCOUNTER — Encounter: Payer: BC Managed Care – PPO | Admitting: Family Medicine

## 2019-12-04 IMAGING — MG MM CLIP PLACEMENT
9 of 14 series · 9 of 14 positions shown · non-contrast
Comparison: Previous exam(s).

CLINICAL DATA: Evaluate biopsy marker

EXAM:
DIAGNOSTIC LEFT MAMMOGRAM POST STEREOTACTIC BIOPSY

[L (1 of 9)]
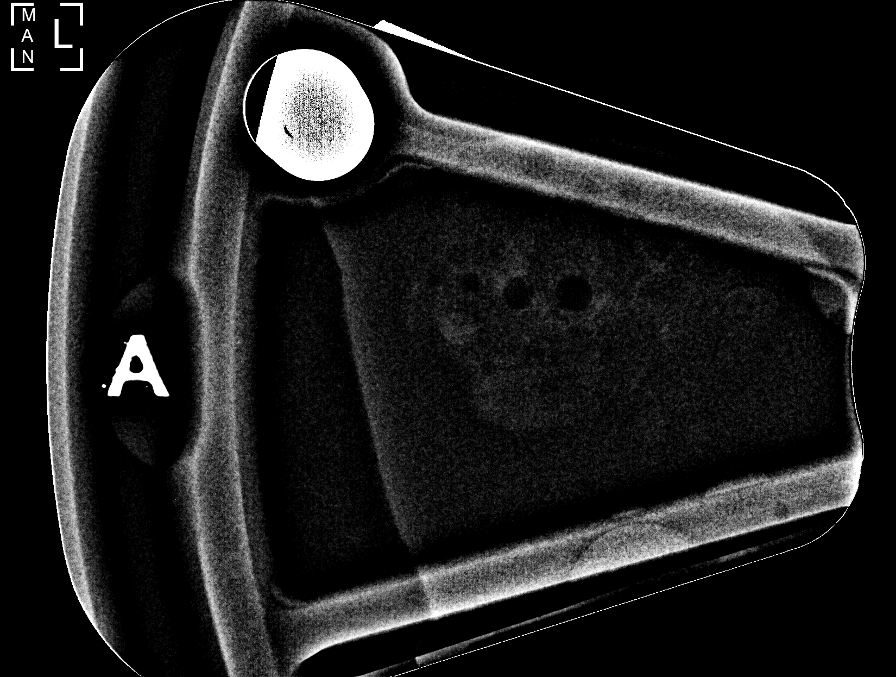

[L (2 of 9)]
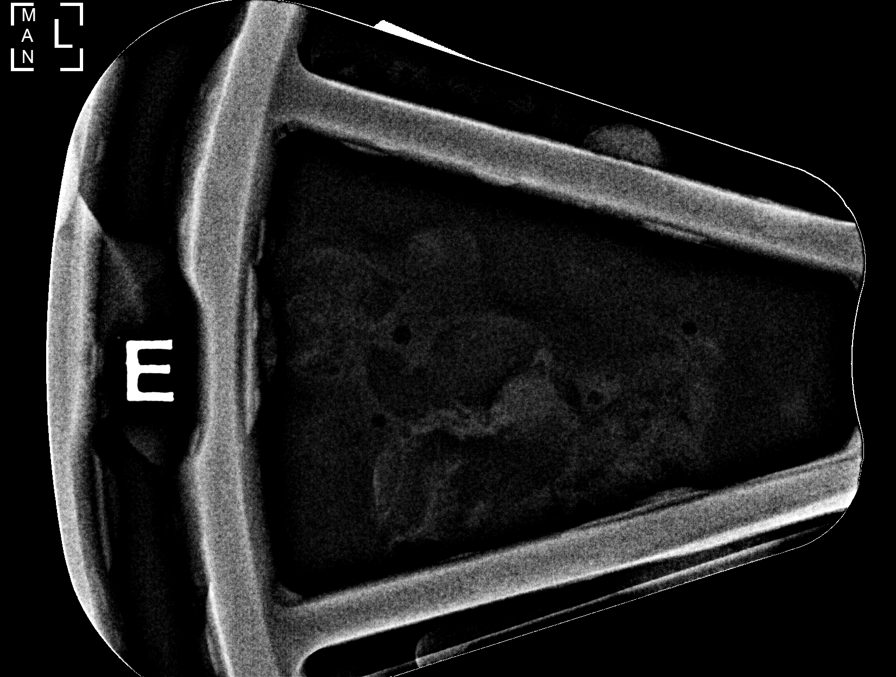

[L (3 of 9)]
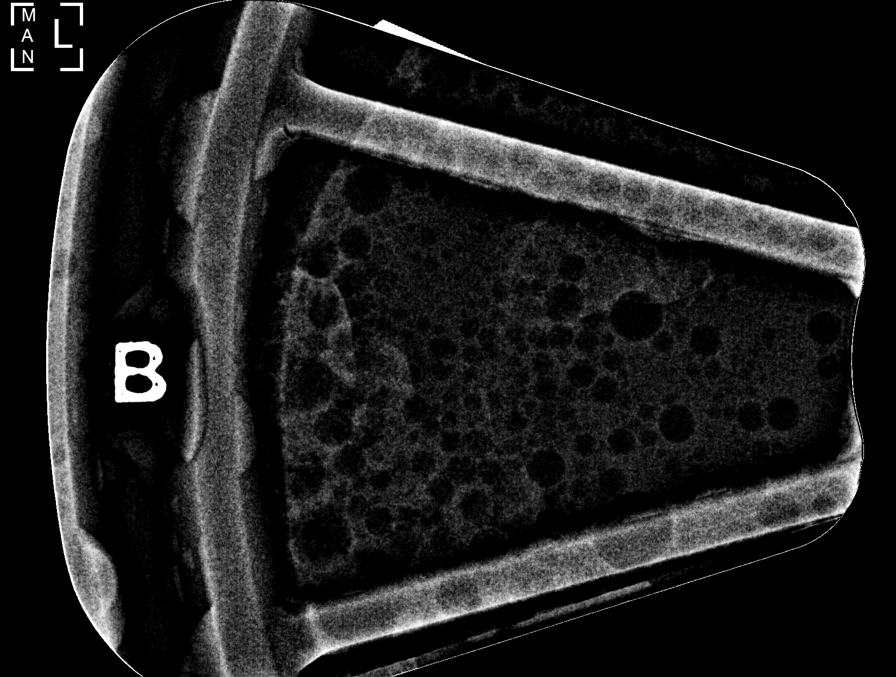

[L (4 of 9)]
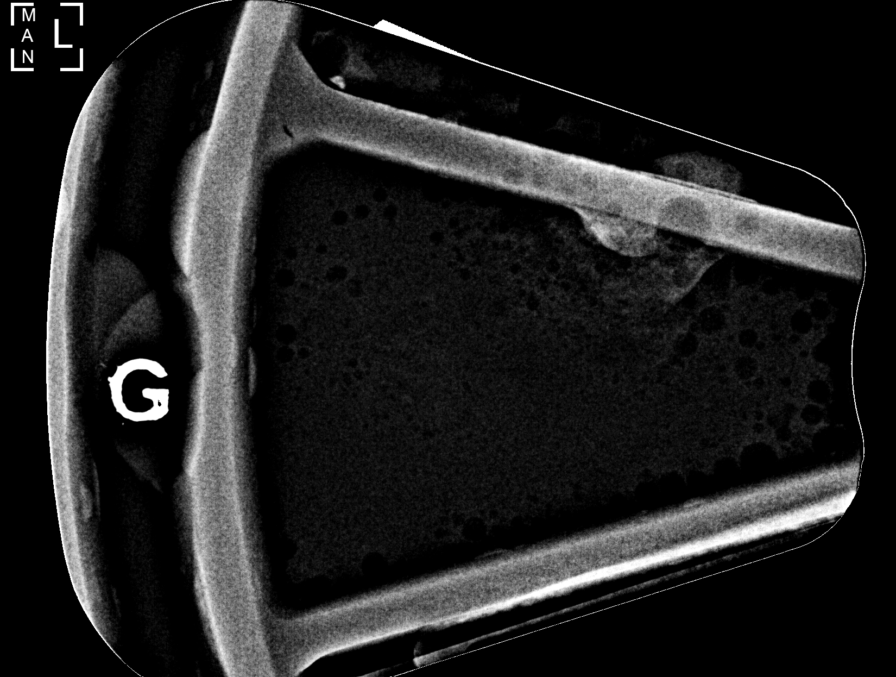

[L (5 of 9)]
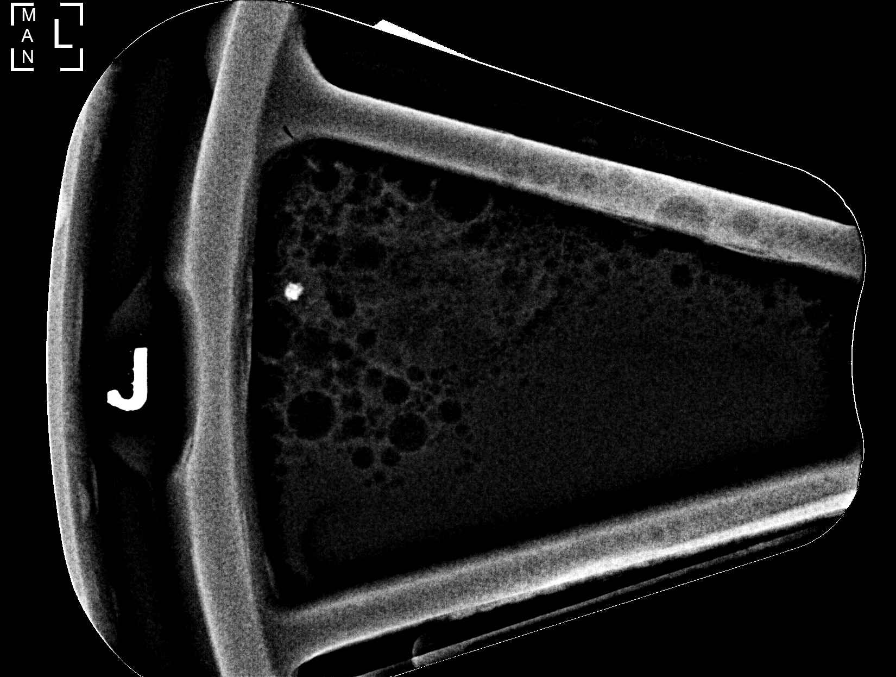

[L (6 of 9)]
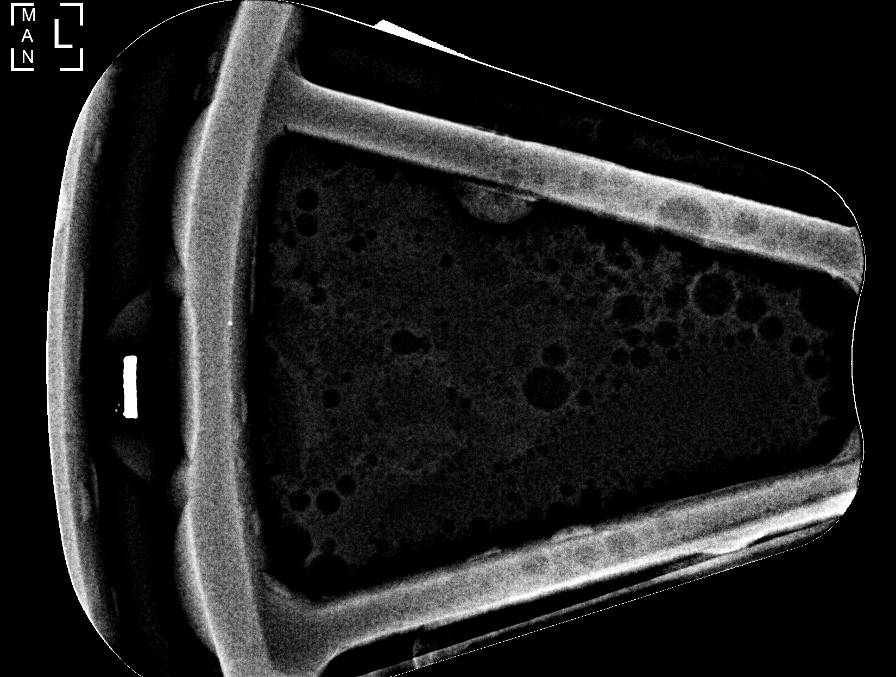

[L (7 of 9)]
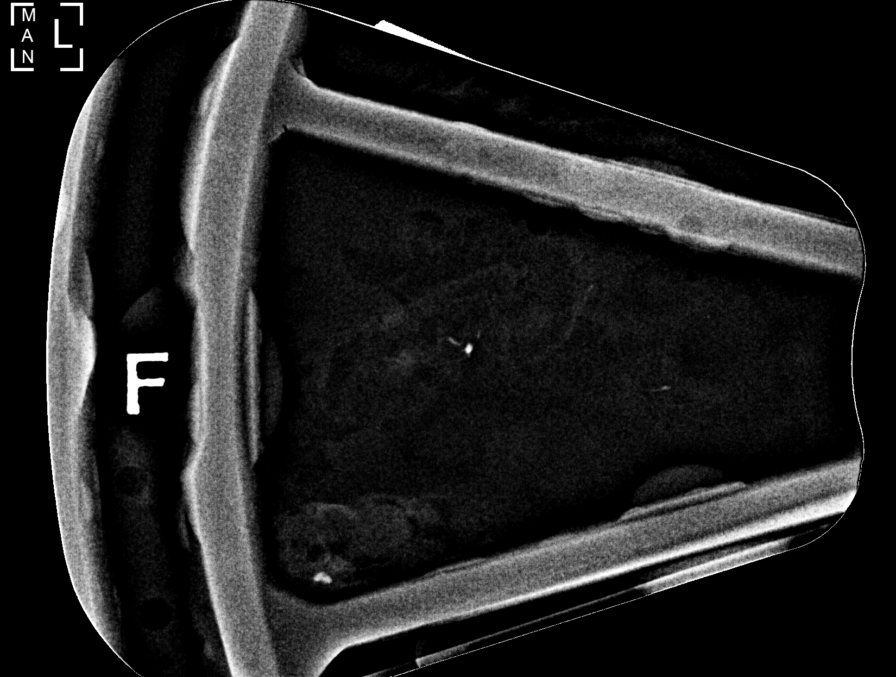

[L (8 of 9)]
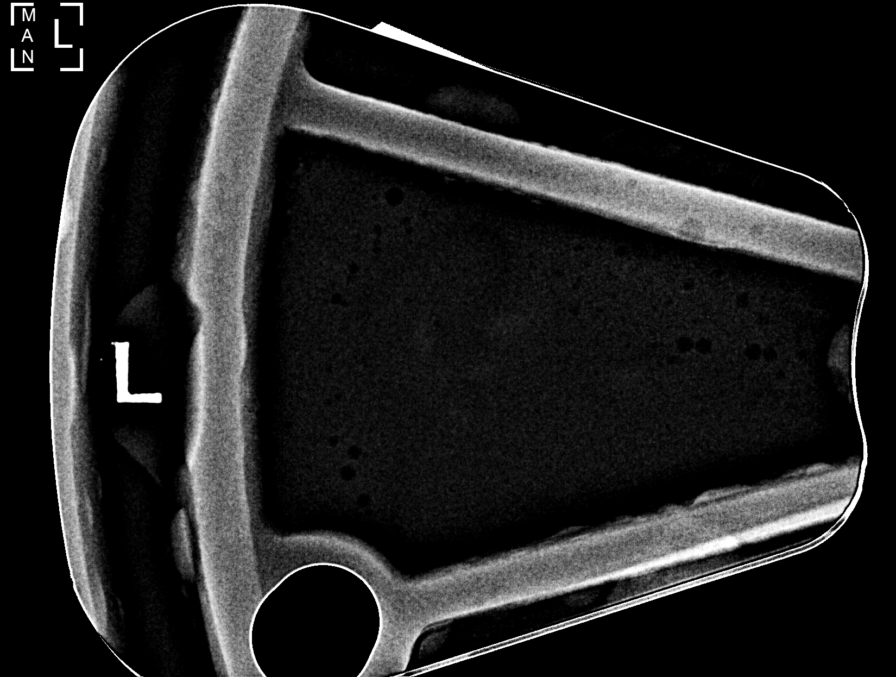

[L (9 of 9)]
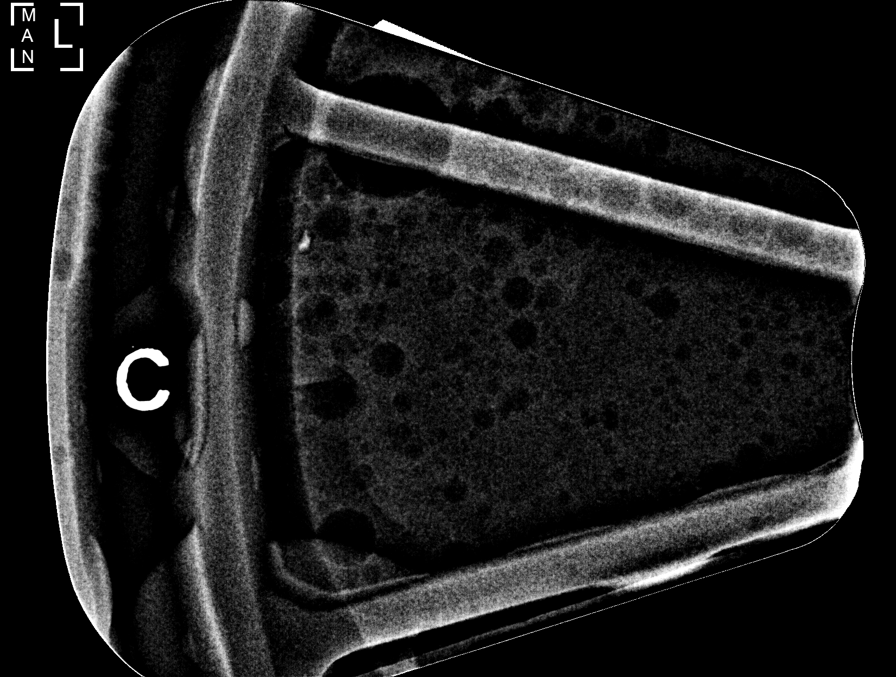

[9 of 14 positions shown; findings below may reference images not displayed]

FINDINGS: Mammographic images were obtained following stereotactic guided
biopsy of left breast calcifications. The coil shaped clip is 7 mm
inferior to the biopsied calcifications.
IMPRESSION: Clip placement as above. The coil shaped clip is 7 mm inferior to
the biopsied calcifications.

Final Assessment: Post Procedure Mammograms for Marker Placement

## 2019-12-05 ENCOUNTER — Ambulatory Visit: Payer: BC Managed Care – PPO | Admitting: Psychology

## 2019-12-06 ENCOUNTER — Other Ambulatory Visit: Payer: Self-pay

## 2019-12-06 ENCOUNTER — Ambulatory Visit (INDEPENDENT_AMBULATORY_CARE_PROVIDER_SITE_OTHER): Payer: BC Managed Care – PPO | Admitting: Family Medicine

## 2019-12-06 ENCOUNTER — Encounter: Payer: Self-pay | Admitting: Family Medicine

## 2019-12-06 VITALS — BP 131/66 | HR 76 | Ht 67.0 in | Wt 220.0 lb

## 2019-12-06 DIAGNOSIS — Z0000 Encounter for general adult medical examination without abnormal findings: Secondary | ICD-10-CM | POA: Diagnosis not present

## 2019-12-06 MED ORDER — LOSARTAN POTASSIUM-HCTZ 50-12.5 MG PO TABS: 1.0000 | ORAL_TABLET | Freq: Every day | ORAL | 3 refills | Status: DC

## 2019-12-06 NOTE — Progress Notes (Signed)
Subjective:     Shannon May is a 49 y.o. female and is here for a comprehensive physical exam. The patient reports no problems.  She is doing well overall.  She has had a bilateral mastectomy and breast Gaffer.  She does have a lump at the incision site on her left breast but she is following back up with the surgeon and they are likely going to do a scar revision/biopsy.  Social History   Socioeconomic History  . Marital status: Married    Spouse name: Ronalee Belts  . Number of children: 3  . Years of education: Masters  . Highest education level: Not on file  Occupational History  . Occupation: Education officer, museum    Comment: [place]    Employer: [employer]  Tobacco Use  . Smoking status: Former Smoker    Types: Cigarettes    Quit date: 06/13/1988    Years since quitting: 31.5  . Smokeless tobacco: Never Used  . Tobacco comment: She smoked a couple of months in her teens.   Substance and Sexual Activity  . Alcohol use: Yes    Alcohol/week: 3.0 standard drinks    Types: 3 Glasses of wine per week  . Drug use: No  . Sexual activity: Yes    Partners: Male  Other Topics Concern  . Not on file  Social History Narrative   Treadmill 5 days per week 2 caffeine drinks per day.    Social Determinants of Health   Financial Resource Strain:   . Difficulty of Paying Living Expenses:   Food Insecurity:   . Worried About Charity fundraiser in the Last Year:   . Arboriculturist in the Last Year:   Transportation Needs:   . Film/video editor (Medical):   Marland Kitchen Lack of Transportation (Non-Medical):   Physical Activity:   . Days of Exercise per Week:   . Minutes of Exercise per Session:   Stress:   . Feeling of Stress :   Social Connections:   . Frequency of Communication with Friends and Family:   . Frequency of Social Gatherings with Friends and Family:   . Attends Religious Services:   . Active Member of Clubs or Organizations:   . Attends Theatre manager Meetings:   Marland Kitchen Marital Status:   Intimate Partner Violence:   . Fear of Current or Ex-Partner:   . Emotionally Abused:   Marland Kitchen Physically Abused:   . Sexually Abused:    Health Maintenance  Topic Date Due  . INFLUENZA VACCINE  04/06/2020  . PAP SMEAR-Modifier  11/08/2021  . COLONOSCOPY  03/08/2026  . TETANUS/TDAP  05/11/2028  . HIV Screening  Completed    The following portions of the patient's history were reviewed and updated as appropriate: allergies, current medications, past family history, past medical history, past social history, past surgical history and problem list.  Review of Systems A comprehensive review of systems was negative.   Objective:    There were no vitals taken for this visit. General appearance: alert, cooperative and appears stated age Head: Normocephalic, without obvious abnormality, atraumatic Eyes: conj clear, EOMI, PEERLA Ears: normal TM's and external ear canals both ears Nose: Nares normal. Septum midline. Mucosa normal. No drainage or sinus tenderness. Throat: lips, mucosa, and tongue normal; teeth and gums normal Neck: no adenopathy, no carotid bruit, no JVD, supple, symmetrical, trachea midline and thyroid not enlarged, symmetric, no tenderness/mass/nodules Back: symmetric, no curvature. ROM normal. No CVA  tenderness. Lungs: clear to auscultation bilaterally Heart: regular rate and rhythm, S1, S2 normal, no murmur, click, rub or gallop Abdomen: soft, non-tender; bowel sounds normal; no masses,  no organomegaly Extremities: extremities normal, atraumatic, no cyanosis or edema Pulses: 2+ and symmetric Skin: Skin color, texture, turgor normal. No rashes or lesions Lymph nodes: Cervical, supraclavicular, and axillary nodes normal. Neurologic: Alert and oriented X 3, normal strength and tone. Normal symmetric reflexes. Normal coordination and gait    Assessment:    Healthy female exam.      Plan:     See After Visit Summary for  Counseling Recommendations   Keep up a regular exercise program and make sure you are eating a healthy diet Try to eat 4 servings of dairy a day, or if you are lactose intolerant take a calcium with vitamin D daily.  Discussed shingles vaccine.  She just had her second Covid vaccine so did encourage her to wait.  She says she will probably get it when she sees me back in 6 months for her hypertension. Your vaccines are up to date.  She has had her Covid vaccination.

## 2019-12-06 NOTE — Patient Instructions (Signed)
Health Maintenance, Female Adopting a healthy lifestyle and getting preventive care are important in promoting health and wellness. Ask your health care provider about:  The right schedule for you to have regular tests and exams.  Things you can do on your own to prevent diseases and keep yourself healthy. What should I know about diet, weight, and exercise? Eat a healthy diet   Eat a diet that includes plenty of vegetables, fruits, low-fat dairy products, and lean protein.  Do not eat a lot of foods that are high in solid fats, added sugars, or sodium. Maintain a healthy weight Body mass index (BMI) is used to identify weight problems. It estimates body fat based on height and weight. Your health care provider can help determine your BMI and help you achieve or maintain a healthy weight. Get regular exercise Get regular exercise. This is one of the most important things you can do for your health. Most adults should:  Exercise for at least 150 minutes each week. The exercise should increase your heart rate and make you sweat (moderate-intensity exercise).  Do strengthening exercises at least twice a week. This is in addition to the moderate-intensity exercise.  Spend less time sitting. Even light physical activity can be beneficial. Watch cholesterol and blood lipids Have your blood tested for lipids and cholesterol at 51 years of age, then have this test every 5 years. Have your cholesterol levels checked more often if:  Your lipid or cholesterol levels are high.  You are older than 51 years of age.  You are at high risk for heart disease. What should I know about cancer screening? Depending on your health history and family history, you may need to have cancer screening at various ages. This may include screening for:  Breast cancer.  Cervical cancer.  Colorectal cancer.  Skin cancer.  Lung cancer. What should I know about heart disease, diabetes, and high blood  pressure? Blood pressure and heart disease  High blood pressure causes heart disease and increases the risk of stroke. This is more likely to develop in people who have high blood pressure readings, are of African descent, or are overweight.  Have your blood pressure checked: ? Every 3-5 years if you are 18-39 years of age. ? Every year if you are 40 years old or older. Diabetes Have regular diabetes screenings. This checks your fasting blood sugar level. Have the screening done:  Once every three years after age 40 if you are at a normal weight and have a low risk for diabetes.  More often and at a younger age if you are overweight or have a high risk for diabetes. What should I know about preventing infection? Hepatitis B If you have a higher risk for hepatitis B, you should be screened for this virus. Talk with your health care provider to find out if you are at risk for hepatitis B infection. Hepatitis C Testing is recommended for:  Everyone born from 1945 through 1965.  Anyone with known risk factors for hepatitis C. Sexually transmitted infections (STIs)  Get screened for STIs, including gonorrhea and chlamydia, if: ? You are sexually active and are younger than 51 years of age. ? You are older than 51 years of age and your health care provider tells you that you are at risk for this type of infection. ? Your sexual activity has changed since you were last screened, and you are at increased risk for chlamydia or gonorrhea. Ask your health care provider if   you are at risk.  Ask your health care provider about whether you are at high risk for HIV. Your health care provider may recommend a prescription medicine to help prevent HIV infection. If you choose to take medicine to prevent HIV, you should first get tested for HIV. You should then be tested every 3 months for as long as you are taking the medicine. Pregnancy  If you are about to stop having your period (premenopausal) and  you may become pregnant, seek counseling before you get pregnant.  Take 400 to 800 micrograms (mcg) of folic acid every day if you become pregnant.  Ask for birth control (contraception) if you want to prevent pregnancy. Osteoporosis and menopause Osteoporosis is a disease in which the bones lose minerals and strength with aging. This can result in bone fractures. If you are 65 years old or older, or if you are at risk for osteoporosis and fractures, ask your health care provider if you should:  Be screened for bone loss.  Take a calcium or vitamin D supplement to lower your risk of fractures.  Be given hormone replacement therapy (HRT) to treat symptoms of menopause. Follow these instructions at home: Lifestyle  Do not use any products that contain nicotine or tobacco, such as cigarettes, e-cigarettes, and chewing tobacco. If you need help quitting, ask your health care provider.  Do not use street drugs.  Do not share needles.  Ask your health care provider for help if you need support or information about quitting drugs. Alcohol use  Do not drink alcohol if: ? Your health care provider tells you not to drink. ? You are pregnant, may be pregnant, or are planning to become pregnant.  If you drink alcohol: ? Limit how much you use to 0-1 drink a day. ? Limit intake if you are breastfeeding.  Be aware of how much alcohol is in your drink. In the [place] one drink equals one 12 oz bottle of beer (355 mL), one 5 oz glass of wine (148 mL), or one 1 oz glass of hard liquor (44 mL). General instructions  Schedule regular health, dental, and eye exams.  Stay current with your vaccines.  Tell your health care provider if: ? You often feel depressed. ? You have ever been abused or do not feel safe at home. Summary  Adopting a healthy lifestyle and getting preventive care are important in promoting health and wellness.  Follow your health care provider's instructions about healthy  diet, exercising, and getting tested or screened for diseases.  Follow your health care provider's instructions on monitoring your cholesterol and blood pressure. This information is not intended to replace advice given to you by your health care provider. Make sure you discuss any questions you have with your health care provider. Document Revised: 08/16/2018 Document Reviewed: 08/16/2018 Elsevier Patient Education  2020 Elsevier Inc.  

## 2019-12-10 ENCOUNTER — Encounter: Payer: Self-pay | Admitting: Family Medicine

## 2019-12-18 ENCOUNTER — Encounter: Payer: Self-pay | Admitting: Plastic Surgery

## 2020-01-02 ENCOUNTER — Ambulatory Visit (INDEPENDENT_AMBULATORY_CARE_PROVIDER_SITE_OTHER): Payer: BC Managed Care – PPO | Admitting: Psychology

## 2020-01-02 DIAGNOSIS — F064 Anxiety disorder due to known physiological condition: Secondary | ICD-10-CM

## 2020-01-06 ENCOUNTER — Other Ambulatory Visit: Payer: Self-pay | Admitting: Family Medicine

## 2020-03-06 LAB — HM PAP SMEAR: HM Pap smear: NEGATIVE

## 2020-03-17 ENCOUNTER — Encounter: Payer: Self-pay | Admitting: Family Medicine

## 2020-03-17 MED ORDER — ESCITALOPRAM OXALATE 5 MG PO TABS: 5.0000 mg | ORAL_TABLET | Freq: Every day | ORAL | 1 refills | Status: DC

## 2020-03-17 NOTE — Telephone Encounter (Signed)
Pls advise if refill is appropriate since pt stopped the medication. Thanks.

## 2020-03-17 NOTE — Telephone Encounter (Signed)
rx sent

## 2020-04-15 ENCOUNTER — Encounter: Payer: Self-pay | Admitting: Family Medicine

## 2020-06-06 ENCOUNTER — Ambulatory Visit (INDEPENDENT_AMBULATORY_CARE_PROVIDER_SITE_OTHER): Payer: BC Managed Care – PPO | Admitting: Family Medicine

## 2020-06-06 ENCOUNTER — Encounter: Payer: Self-pay | Admitting: Family Medicine

## 2020-06-06 ENCOUNTER — Other Ambulatory Visit: Payer: Self-pay

## 2020-06-06 VITALS — BP 120/61 | HR 82 | Ht 67.0 in | Wt 226.0 lb

## 2020-06-06 DIAGNOSIS — F320 Major depressive disorder, single episode, mild: Secondary | ICD-10-CM | POA: Diagnosis not present

## 2020-06-06 DIAGNOSIS — I10 Essential (primary) hypertension: Secondary | ICD-10-CM | POA: Diagnosis not present

## 2020-06-06 DIAGNOSIS — C50412 Malignant neoplasm of upper-outer quadrant of left female breast: Secondary | ICD-10-CM

## 2020-06-06 DIAGNOSIS — Z23 Encounter for immunization: Secondary | ICD-10-CM | POA: Diagnosis not present

## 2020-06-06 DIAGNOSIS — Z170 Estrogen receptor positive status [ER+]: Secondary | ICD-10-CM

## 2020-06-06 NOTE — Assessment & Plan Note (Signed)
Follow-up scheduled with the surgeon later this year.

## 2020-06-06 NOTE — Assessment & Plan Note (Addendum)
Well controlled. Continue current regimen. Follow up in  6 mo.  Due for updated blood work.

## 2020-06-06 NOTE — Progress Notes (Signed)
Established Patient Office Visit  Subjective:  Patient ID: Shannon May, female    DOB: [birth year]  Age: 49 y.o. MRN: [id number]  CC: No chief complaint on file.   HPI Shannon May presents for   Hypertension- Pt denies chest pain, SOB, dizziness, or heart palpitations.  Taking meds as directed w/o problems.  Denies medication side effects.    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  She feels like the Lexapro has been super helpful.  She actually came off of it for about a month or 2 and was just feeling really stressed and overwhelmed again a little bit of chest pain she had sent me a my chart note and we restarted it and she has been happy with it and seems to be really keeping her symptoms under control she would like to continue it for now and at least get through her dissertation and graduation.  He also wanted to discuss getting her shingles vaccine as well as her flu shot today.  Past Medical History:  Diagnosis Date  . Anxiety   . Diverticulosis   . Hypertension 11/08/2017  . Malignant neoplasm of upper-outer quadrant of left breast in female, estrogen receptor positive ([place]) 06/08/2018  . Sinusitis 11/15/2017    Past Surgical History:  Procedure Laterality Date  . BREAST RECONSTRUCTION WITH PLACEMENT OF TISSUE EXPANDER AND ALLODERM N/A 08/14/2018   Procedure: BREAST RECONSTRUCTION WITH PLACEMENT OF TISSUE EXPANDER AND FLEXHD;  Surgeon: Wallace Going, DO;  Location: [facility];  Service: Plastics;  Laterality: N/A;  . BREAST SURGERY    . DILATION AND CURETTAGE OF UTERUS    . LASIK    . LIPOSUCTION Bilateral 11/15/2018   Procedure: LIPOSUCTION;  Surgeon: Wallace Going, DO;  Location: [facility];  Service: Plastics;  Laterality: Bilateral;  . LIPOSUCTION WITH LIPOFILLING Bilateral 02/22/2019   Procedure: LIPOSUCTION WITH  LIPOFILLING;  Surgeon: Wallace Going, DO;  Location: [facility];  Service: Plastics;  Laterality: Bilateral;  2 hours, please  . MASTECTOMY W/ SENTINEL NODE BIOPSY Bilateral 08/14/2018   with breast reconstruction  . MASTECTOMY W/ SENTINEL NODE BIOPSY Bilateral 08/14/2018   Procedure: BILATERAL MASTECTOMIES  WITH LEFT SENTINEL LYMPH NODE MAPPING;  Surgeon: Jovita Kussmaul, MD;  Location: [facility];  Service: General;  Laterality: Bilateral;  . REMOVAL OF BILATERAL TISSUE EXPANDERS WITH PLACEMENT OF BILATERAL BREAST IMPLANTS Bilateral 11/15/2018   Procedure: removal of bilateral expanders and placement of bilateral silicone implants.;  Surgeon: Wallace Going, DO;  Location: [facility];  Service: Plastics;  Laterality: Bilateral;    Family History  Problem Relation Age of Onset  . Colon cancer Maternal Grandfather   . Melanoma Paternal Grandfather   . Heart attack Maternal Grandmother   . Hypertension Mother   . Hyperlipidemia Father   . Vaginal cancer Maternal Aunt     Social History   Socioeconomic History  . Marital status: Married    Spouse name: Ronalee Belts  . Number of children: 3  . Years of education: Masters  . Highest education level: Not on file  Occupational History  . Occupation: Education officer, museum    Comment: [place]    Employer: [employer]  Tobacco Use  . Smoking status: Former Smoker    Types: Cigarettes    Quit date: 06/13/1988  Years since quitting: 32.0  . Smokeless tobacco: Never Used  . Tobacco comment: She smoked a couple of months in her teens.   Vaping Use  . Vaping Use: Never used  Substance and Sexual Activity  . Alcohol use: Yes    Alcohol/week: 3.0 standard drinks    Types: 3 Glasses of wine per week  . Drug use: No  . Sexual activity: Yes    Partners: Male  Other Topics Concern  . Not on file  Social History Narrative   Treadmill 5 days per week 2 caffeine drinks per day.    Social Determinants of  Health   Financial Resource Strain:   . Difficulty of Paying Living Expenses: Not on file  Food Insecurity:   . Worried About Charity fundraiser in the Last Year: Not on file  . Ran Out of Food in the Last Year: Not on file  Transportation Needs:   . Lack of Transportation (Medical): Not on file  . Lack of Transportation (Non-Medical): Not on file  Physical Activity:   . Days of Exercise per Week: Not on file  . Minutes of Exercise per Session: Not on file  Stress:   . Feeling of Stress : Not on file  Social Connections:   . Frequency of Communication with Friends and Family: Not on file  . Frequency of Social Gatherings with Friends and Family: Not on file  . Attends Religious Services: Not on file  . Active Member of Clubs or Organizations: Not on file  . Attends Archivist Meetings: Not on file  . Marital Status: Not on file  Intimate Partner Violence:   . Fear of Current or Ex-Partner: Not on file  . Emotionally Abused: Not on file  . Physically Abused: Not on file  . Sexually Abused: Not on file    Outpatient Medications Prior to Visit  Medication Sig Dispense Refill  . Ascorbic Acid (VITAMIN C PO) Take by mouth.    Mariane Baumgarten Calcium (STOOL SOFTENER PO) Take 1 tablet by mouth 2 (two) times daily.     Marland Kitchen escitalopram (LEXAPRO) 5 MG tablet Take 1 tablet (5 mg total) by mouth daily. 90 tablet 1  . fluticasone (FLONASE) 50 MCG/ACT nasal spray PLACE 2 SPRAYS INTO THE NOSE DAILY (Patient taking differently: Place 2 sprays into both nostrils daily. ) 16 g 1  . loratadine (CLARITIN) 10 MG tablet Take 10 mg by mouth daily as needed for allergies.    Marland Kitchen losartan-hydrochlorothiazide (HYZAAR) 50-12.5 MG tablet Take 1 tablet by mouth daily. 90 tablet 3  . Multiple Vitamins-Minerals (MULTIVITAMIN WOMEN PO) Take 1 capsule by mouth daily.    . Multiple Vitamins-Minerals (ZINC PO) Take by mouth.    . Probiotic Product (PRO-BIOTIC BLEND PO) Take 1 tablet by mouth daily.      No  facility-administered medications prior to visit.    Allergies  Allergen Reactions  . Antihistamines, Chlorpheniramine-Type Shortness Of Breath    Makes heart race  . Sudafed [Pseudoephedrine Hcl]     shaking  . Sulfamethoxazole Nausea And Vomiting  . Erythromycin Nausea Only    ROS Review of Systems    Objective:    Physical Exam Constitutional:      Appearance: She is well-developed.  HENT:     Head: Normocephalic and atraumatic.  Cardiovascular:     Rate and Rhythm: Normal rate and regular rhythm.     Heart sounds: Normal heart sounds.  Pulmonary:     Effort:  Pulmonary effort is normal.     Breath sounds: Normal breath sounds.  Skin:    General: Skin is warm and dry.  Neurological:     Mental Status: She is alert and oriented to person, place, and time.  Psychiatric:        Behavior: Behavior normal.     BP 120/61   Pulse 82   Ht 5\' 7"  (1.702 m)   Wt 226 lb (102.5 kg)   SpO2 98%   BMI 35.40 kg/m  Wt Readings from Last 3 Encounters:  06/06/20 226 lb (102.5 kg)  12/10/19 220 lb (99.8 kg)  10/02/19 220 lb (99.8 kg)     Health Maintenance Due  Topic Date Due  . Hepatitis C Screening  Never done  . INFLUENZA VACCINE  04/06/2020    There are no preventive care reminders to display for this patient.  Lab Results  Component Value Date   TSH 3.15 10/02/2019   Lab Results  Component Value Date   WBC 7.8 10/02/2019   HGB 14.0 10/02/2019   HCT 41.1 10/02/2019   MCV 95.4 10/02/2019   PLT 317 10/02/2019   Lab Results  Component Value Date   NA 139 10/02/2019   K 3.7 10/02/2019   CO2 26 10/02/2019   GLUCOSE 114 (H) 10/02/2019   BUN 16 10/02/2019   CREATININE 0.81 10/02/2019   BILITOT 0.4 10/02/2019   ALKPHOS 59 12/08/2016   AST 17 10/02/2019   ALT 14 10/02/2019   PROT 7.1 10/02/2019   ALBUMIN 4.4 12/08/2016   CALCIUM 9.6 10/02/2019   ANIONGAP 9 02/19/2019   Lab Results  Component Value Date   CHOL 205 (H) 10/02/2019   Lab Results   Component Value Date   HDL 45 (L) 10/02/2019   Lab Results  Component Value Date   LDLCALC 126 (H) 10/02/2019   Lab Results  Component Value Date   TRIG 203 (H) 10/02/2019   Lab Results  Component Value Date   CHOLHDL 4.6 10/02/2019   No results found for: HGBA1C    Assessment & Plan:   Problem List Items Addressed This Visit      Cardiovascular and Mediastinum   Hypertension - Primary    Well controlled. Continue current regimen. Follow up in  6 mo.  Due for updated blood work.      Relevant Orders   BASIC METABOLIC PANEL WITH GFR     Other   Malignant neoplasm of upper-outer quadrant of left breast in female, estrogen receptor positive ([place])    Follow-up scheduled with the surgeon later this year.      Depression, major, single episode, mild ([place])    Is actually doing really well.  But would like to continue the medication for probably at least the next 3 to 4 months.  We can follow-up again in 6 months.  If she decides she wants to taper off sooner then please let me know.       Other Visit Diagnoses    Need for shingles vaccine       Relevant Orders   Varicella-zoster vaccine IM (Completed)      Gust shingles vaccine.  Given first 1 today follow-up in 2 to 6 months for second vaccine.  No orders of the defined types were placed in this encounter.   Follow-up: Return in about 6 months (around 12/05/2020) for Hypertension.    Beatrice Lecher, MD

## 2020-06-06 NOTE — Progress Notes (Signed)
Patient wants to discuss getting a shingles vaccine.

## 2020-06-06 NOTE — Assessment & Plan Note (Signed)
Is actually doing really well.  But would like to continue the medication for probably at least the next 3 to 4 months.  We can follow-up again in 6 months.  If she decides she wants to taper off sooner then please let me know.

## 2020-06-23 ENCOUNTER — Other Ambulatory Visit (HOSPITAL_COMMUNITY): Payer: Self-pay | Admitting: Physician Assistant

## 2020-06-23 ENCOUNTER — Encounter: Payer: Self-pay | Admitting: Physician Assistant

## 2020-06-23 NOTE — Progress Notes (Signed)
I connected by phone with Shannon May on 06/23/2020 at 6:14 PM to discuss the potential use of a new treatment for mild to moderate COVID-19 viral infection in non-hospitalized patients.  This patient is a 49 y.o. female that meets the FDA criteria for Emergency Use Authorization of COVID monoclonal antibody casirivimab/imdevimab or bamlanivimab/eteseviamb.  Has a (+) direct SARS-CoV-2 viral test result  Has mild or moderate COVID-19   Is NOT hospitalized due to COVID-19  Is within 10 days of symptom onset  Has at least one of the high risk factor(s) for progression to severe COVID-19 and/or hospitalization as defined in EUA.  Specific high risk criteria : Cardiovascular disease or hypertension   I have spoken and communicated the following to the patient or parent/caregiver regarding COVID monoclonal antibody treatment:  1. FDA has authorized the emergency use for the treatment of mild to moderate COVID-19 in adults and pediatric patients with positive results of direct SARS-CoV-2 viral testing who are 77 years of age and older weighing at least 40 kg, and who are at high risk for progressing to severe COVID-19 and/or hospitalization.  2. The significant known and potential risks and benefits of COVID monoclonal antibody, and the extent to which such potential risks and benefits are unknown.  3. Information on available alternative treatments and the risks and benefits of those alternatives, including clinical trials.  4. Patients treated with COVID monoclonal antibody should continue to self-isolate and use infection control measures (e.g., wear mask, isolate, social distance, avoid sharing personal items, clean and disinfect "high touch" surfaces, and frequent handwashing) according to CDC guidelines.   5. The patient or parent/caregiver has the option to accept or refuse COVID monoclonal antibody treatment.  After reviewing this information with the patient, the patient has  agreed to receive one of the available covid 19 monoclonal antibodies and will be provided an appropriate fact sheet prior to infusion. Konrad Felix, PA-C 06/23/2020 6:14 PM

## 2020-06-24 ENCOUNTER — Telehealth (INDEPENDENT_AMBULATORY_CARE_PROVIDER_SITE_OTHER): Payer: BC Managed Care – PPO | Admitting: Medical-Surgical

## 2020-06-24 ENCOUNTER — Encounter: Payer: Self-pay | Admitting: Medical-Surgical

## 2020-06-24 ENCOUNTER — Ambulatory Visit (HOSPITAL_COMMUNITY)
Admission: RE | Admit: 2020-06-24 | Discharge: 2020-06-24 | Disposition: A | Discharge status: Home / Self Care | Payer: BC Managed Care – PPO | Source: Ambulatory Visit | Attending: Pulmonary Disease | Admitting: Pulmonary Disease

## 2020-06-24 VITALS — BP 116/68 | HR 71 | Temp 97.1°F

## 2020-06-24 DIAGNOSIS — U071 COVID-19: Secondary | ICD-10-CM | POA: Diagnosis present

## 2020-06-24 MED ORDER — METHYLPREDNISOLONE SODIUM SUCC 125 MG IJ SOLR
125.0000 mg | Freq: Once | INTRAMUSCULAR | Status: AC | PRN
  Administered 2020-06-24: 125 mg via INTRAVENOUS
  Filled 2020-06-24: qty 2

## 2020-06-24 MED ORDER — SODIUM CHLORIDE 0.9 % IV SOLN: Freq: Once | INTRAVENOUS | Status: AC

## 2020-06-24 MED ORDER — SODIUM CHLORIDE 0.9 % IV SOLN: INTRAVENOUS | Status: DC | PRN

## 2020-06-24 MED ORDER — PROMETHAZINE-DM 6.25-15 MG/5ML PO SYRP: 5.0000 mL | ORAL_SOLUTION | Freq: Four times a day (QID) | ORAL | 0 refills | Status: DC | PRN

## 2020-06-24 MED ORDER — ALBUTEROL SULFATE HFA 108 (90 BASE) MCG/ACT IN AERS: 2.0000 | INHALATION_SPRAY | Freq: Once | RESPIRATORY_TRACT | Status: DC | PRN

## 2020-06-24 MED ORDER — FAMOTIDINE IN NACL 20-0.9 MG/50ML-% IV SOLN
20.0000 mg | Freq: Once | INTRAVENOUS | Status: AC | PRN
  Administered 2020-06-24: 20 mg via INTRAVENOUS
  Filled 2020-06-24: qty 50

## 2020-06-24 MED ORDER — EPINEPHRINE 0.3 MG/0.3ML IJ SOAJ: 0.3000 mg | Freq: Once | INTRAMUSCULAR | Status: DC | PRN

## 2020-06-24 MED ORDER — DIPHENHYDRAMINE HCL 50 MG/ML IJ SOLN
50.0000 mg | Freq: Once | INTRAMUSCULAR | Status: DC | PRN
  Filled 2020-06-24: qty 1

## 2020-06-24 NOTE — Progress Notes (Signed)
[facility] mAb infusion clinic: Patient note  RN called this NP to bedside shortly after starting infusion of Monoclonal Antibodies (bamlanivimab / etesevimab) for treatment of COViD-19 viral infection.   Patient reports approximately 3-5 minutes after infusion start she began to feel a warm sensation across her chest and abdomen. She reports the sensation radiated to her arms.   RN reports the infusion was immediately stopped at report of symptoms and PRN solumedrol was given via IV push and famotidine via IV infusion.   Patient reported resolution of symptoms after the infusion was stopped and medication provided.   ROS: CONST: Patient denies dizziness, confusion, or change in mental status HENT: Denies swelling or tingling to mouth, lips, throat, nose, or ears. Denies new or worsening sore throat or cough.  CARDS: Denies palpitations, chest pain, chest pressure, dizziness, or impending doom. Denies pain in the abdomen, back, jaw, neck, or arm.  PULM: She endorses shortness of breath, cough, and wheeze at baseline due to COVID-19, but no worsening of symptoms since event onset.  She denies inability to take full breath or feeling of tightness in the throat or chest.  GI: Denies nausea, vomiting, diarrhea, or abdominal pain.  SKIN: Denies sweating, flushing, itching, pins and needles, rash, or changes to body temperature.    Physical Exam: CONST: Upon assessment patient is alert and oriented x4. Vital signs stable with no evidence of hypertension, tachycardia, tachypnea, decreased saturations, or elevated temperature.  HENT: PERRLA. Lips appropriate size and color with no evidence of edema. Oropharynx clear with no signs of edema. Tongue midline with no edema present.  CARDS: Heart rate and rhythm regular with no ectopic beats noted. No carotid bruit present. Pulses intact +2 at periphery.  No LE edema present. Capillary refill <3 sec.  PULM: Left lung clear with no wheezes or rhonchi  present. Right lung reveals expiratory wheezes in all lung fields. No tachypnea or shortness of breath present.  GI: BS present in all quadrants. Abdomen soft, non-tender.  SKIN: Skin color appropriate for ethnicity. No perspiration present.   PLAN: Discussed with patient and her sister (an NP who the patient asked [place] to speak with) that vital signs were stable with no evidence of cardiac abnormality, respiratory distress, or severe allergic reaction.   We discussed the option of EKG for further evaluation and peace of mind for patient. Patient decided to avoid transport to the ED at this time for EKG monitoring. She was educated to notify us immediately if she begins to feel new or worsening symptoms or changes her mind.   Discussed the option to abort treatment at this time or attempt to continue treatment with infusion run at slower rate. A joint decision was made to restart mAb infusion at 37mL/h. Personally monitored patient for the first 15 minutes of infusion. Patient showed no signs of reaction, no vital sign changes, no warmth or burning sensation, and no changes to respiratory or cardiac status.   Infusion continued at reduced rate of 34mL/hr with close monitoring by myself and nursing staff.   Nursing staff instructed to notify me immediately to any changes in the patients status during or after infusion.

## 2020-06-24 NOTE — Progress Notes (Signed)
  Diagnosis: COVID-19  Physician: Dr. Joya Gaskins  Procedure: [facility] Infusion Clinic Med: bamlanivimab\etesevimab infusion - Provided patient with bamlanimivab\etesevimab fact sheet for patients, parents and caregivers prior to infusion.  Complications: No immediate complications noted.  Discharge: Discharged home   Shannon May 06/24/2020

## 2020-06-24 NOTE — Progress Notes (Signed)
Virtual Visit via Video Note  I connected with Shannon May on 06/24/20 at  8:30 AM EDT by a video enabled telemedicine application and verified that I am speaking with the correct person using two identifiers.   I discussed the limitations of evaluation and management by telemedicine and the availability of in person appointments. The patient expressed understanding and agreed to proceed.  Patient location: home Provider locations: office  Subjective:    CC: COVID +, cough, congestion  HPI: Pleasant 49 year old female presenting via MyChart video visit with reports of testing Covid positive. She developed a runny nose with nasal congestion on Friday 9/15. She received her Covid booster vaccine Friday evening at 6 PM and awoke feeling horrible on Saturday. Her symptoms include loss of smell, shortness of breath with activity, fatigue, nausea, nonproductive cough that is worse at night, chest congestion, sinus congestion, rhinorrhea, postnasal drip, and I/ear pressure and fullness. She has been taking NyQuil, ibuprofen, Tylenol, Flonase, and Claritin. She did do a saline nasal rinse once but has not repeated this since she lost her sense of smell. She is worried due to her comorbid conditions and history of breast cancer with double mastectomy. Notes that she did do tamoxifen prior to her mastectomy but she did not do chemotherapy or radiation. Of note, she was to have her first [employer] concert tonight since having breast cancer. 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 This will still occur however she will have to do it virtually. She is scheduled at 9:30 AM today for the monoclonal antibody infusion. Eating and drinking okay. Sleeping okay with the use of NyQuil but her cough is still difficult to manage. No current fever, chills, vomiting, or diarrhea. Has questions regarding starting baby aspirin for blood clot prophylaxis as well as Pepcid. She was told by a friend of  hers that these 2 medications are recommended.  Past medical history, Surgical history, Family history not pertinant except as noted below, Social history, Allergies, and medications have been entered into the medical record, reviewed, and corrections made.   Review of Systems: See HPI for pertinent positives and negatives.   Objective:    General: Speaking clearly in complete sentences without any shortness of breath.  Alert and oriented x3.  Normal judgment. No apparent acute distress.  Impression and Recommendations:    1. COVID-19 virus infection Discussed the expected course of Covid and the variations of timeline that were seen. Sending in promethazine DM to help with her cough at night. Symptoms continue to be mild at this time but if they do worsen, she may benefit from a course of dexamethasone. Strongly recommend going through with the monoclonal antibody infusion. Drink plenty of fluids and increase rest. Okay to continue over-the-counter medications if desired. Avoid using Promethazine DM and NyQuil together. Discussed available evidence and risks versus benefits of starting baby aspirin and Pepcid. Reviewed emergency symptoms that should prompt ED/UC evaluation.  20 minutes of non face-to-face time was provided during this encounter.  Return if symptoms worsen or fail to improve.   I discussed the assessment and treatment plan with the patient. The patient was provided an opportunity to ask questions and all were answered. The patient agreed with the plan and demonstrated an understanding of the instructions.   The patient was advised to call back or seek an in-person evaluation if the symptoms worsen or if the condition fails to improve as anticipated.  Clearnce Sorrel, DNP, APRN, FNP-BC [facility]

## 2020-06-24 NOTE — Discharge Instructions (Signed)
10 Things You Can Do to Manage Your COVID-19 Symptoms at Home If you have possible or confirmed COVID-19: 1. Stay home from work and school. And stay away from other public places. If you must go out, avoid using any kind of public transportation, ridesharing, or taxis. 2. Monitor your symptoms carefully. If your symptoms get worse, call your healthcare provider immediately. 3. Get rest and stay hydrated. 4. If you have a medical appointment, call the healthcare provider ahead of time and tell them that you have or may have COVID-19. 5. For medical emergencies, call 911 and notify the dispatch personnel that you have or may have COVID-19. 6. Cover your cough and sneezes with a tissue or use the inside of your elbow. 7. Wash your hands often with soap and water for at least 20 seconds or clean your hands with an alcohol-based hand sanitizer that contains at least 60% alcohol. 8. As much as possible, stay in a specific room and away from other people in your home. Also, you should use a separate bathroom, if available. If you need to be around other people in or outside of the home, wear a mask. 9. Avoid sharing personal items with other people in your household, like dishes, towels, and bedding. 10. Clean all surfaces that are touched often, like counters, tabletops, and doorknobs. Use household cleaning sprays or wipes according to the label instructions. What types of side effects do monoclonal antibody drugs cause?  Common side effects  In general, the more common side effects caused by monoclonal antibody drugs include: Allergic reactions, such as hives or itching Flu-like signs and symptoms, including chills, fatigue, fever, and muscle aches and pains Nausea, vomiting Diarrhea Skin rashes Low blood pressure   The CDC is recommending patients who receive monoclonal antibody treatments wait at least 90 days before being vaccinated.  11. Currently, there are no data on the safety and  efficacy of mRNA COVID-19 vaccines in persons who received monoclonal antibodies or convalescent plasma as part of COVID-19 treatment. Based on the estimated half-life of such therapies as well as evidence suggesting that reinfection is uncommon in the 90 days after initial infection, vaccination should be deferred for at least 90 days, as a precautionary measure until additional information becomes available, to avoid interference of the antibody treatment with vaccine-induced immune responses. [email or web address] 03/07/2019 This information is not intended to replace advice given to you by your health care provider. Make sure you discuss any questions you have with your health care provider. Document Revised: 08/09/2019 Document Reviewed: 08/09/2019 Elsevier Patient Education  [place].

## 2020-07-03 ENCOUNTER — Telehealth (INDEPENDENT_AMBULATORY_CARE_PROVIDER_SITE_OTHER): Payer: BC Managed Care – PPO | Admitting: Medical-Surgical

## 2020-07-03 ENCOUNTER — Other Ambulatory Visit: Payer: Self-pay

## 2020-07-03 ENCOUNTER — Encounter: Payer: Self-pay | Admitting: Medical-Surgical

## 2020-07-03 ENCOUNTER — Ambulatory Visit (INDEPENDENT_AMBULATORY_CARE_PROVIDER_SITE_OTHER): Payer: BC Managed Care – PPO

## 2020-07-03 DIAGNOSIS — M546 Pain in thoracic spine: Secondary | ICD-10-CM | POA: Diagnosis not present

## 2020-07-03 DIAGNOSIS — U099 Post covid-19 condition, unspecified: Secondary | ICD-10-CM

## 2020-07-03 DIAGNOSIS — U071 COVID-19: Secondary | ICD-10-CM

## 2020-07-03 DIAGNOSIS — R059 Cough, unspecified: Secondary | ICD-10-CM

## 2020-07-03 LAB — CBC WITH DIFFERENTIAL/PLATELET
Absolute Monocytes: 613 cells/uL (ref 200–950)
Basophils Absolute: 42 cells/uL (ref 0–200)
Basophils Relative: 0.5 %
Eosinophils Absolute: 126 cells/uL (ref 15–500)
Eosinophils Relative: 1.5 %
HCT: 39.3 % (ref 35.0–45.0)
Hemoglobin: 13.5 g/dL (ref 11.7–15.5)
Lymphs Abs: 2814 cells/uL (ref 850–3900)
MCH: 32.9 pg (ref 27.0–33.0)
MCHC: 34.4 g/dL (ref 32.0–36.0)
MCV: 95.9 fL (ref 80.0–100.0)
MPV: 10.2 fL (ref 7.5–12.5)
Monocytes Relative: 7.3 %
Neutro Abs: 4805 cells/uL (ref 1500–7800)
Neutrophils Relative %: 57.2 %
Platelets: 346 10*3/uL (ref 140–400)
RBC: 4.1 10*6/uL (ref 3.80–5.10)
RDW: 12 % (ref 11.0–15.0)
Total Lymphocyte: 33.5 %
WBC: 8.4 10*3/uL (ref 3.8–10.8)

## 2020-07-03 MED ORDER — DEXAMETHASONE 6 MG PO TABS
6.0000 mg | ORAL_TABLET | Freq: Three times a day (TID) | ORAL | 0 refills | Status: DC
Start: 2020-07-03 — End: 2020-07-14

## 2020-07-03 MED ORDER — DEXAMETHASONE 6 MG PO TABS: 6.0000 mg | ORAL_TABLET | Freq: Three times a day (TID) | ORAL | 0 refills | Status: DC

## 2020-07-03 NOTE — Telephone Encounter (Signed)
Re-sent to pharmacy.

## 2020-07-03 NOTE — Progress Notes (Signed)
Virtual Visit via Video Note  I connected with [name] on 07/03/20 at  2:00 PM EDT by a video enabled telemedicine application and verified that I am speaking with the correct person using two identifiers.   I discussed the limitations of evaluation and management by telemedicine and the availability of in person appointments. The patient expressed understanding and agreed to proceed.  Patient location: home Provider locations: office  Subjective:    CC: fatigue, cough  HPI: Pleasant 49 year old female presenting via MyChart video visit with complaints of severe fatigue, cough, dizziness, weakness, nausea, loose stools, and upper to mid back pain. She tested positive for COVID on 10/17 with onset of symptoms 10/15. She progressed through the expected course of the illness and did not need hospitalization. She received the antibody infusion on 10/19, had a reaction to it and had to be treated with IV steroids and benadryl before resuming the infusion. Today, she notes that she went back to work yesterday and felt okay. Unfortunately, today she has developed a return of symptoms as described above. Her cough is nonproductive but she notes it feels like there are secretions moving around, they just won't come up. She notes an almost "weighted" feeling as if she is at the bottom of a drop on a roller coaster and going up again. No fever throughout her illness. No chills, vomiting, or significant SOB. She has not treated her new symptoms with any OTC or home remedies. Family members at home are still fighting active [place].    Past medical history, Surgical history, Family history not pertinant except as noted below, Social history, Allergies, and medications have been entered into the medical record, reviewed, and corrections made.   Review of Systems: See HPI for pertinent positives and negatives.   Objective:    General: Speaking clearly in complete sentences without any shortness of  breath.  Alert and oriented x3.  Normal judgment. No apparent acute distress.  Impression and Recommendations:    1. COVID-19 virus infection Checking CBC with diff to evaluate for potential secondary bacterial infection and or elevated platelets.  - CBC with Differential  2. Cough STAT chest x-ray to evaluate for pneumonia. Checking CBC with diff.  - DG Chest 2 View; Future - CBC with Differential  3. Post-COVID syndrome Her symptoms are likely related to a post-COVID syndrome. 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 Discussed that symptoms can last for weeks to months and the fatigue can at times be debilitating. Sending in a 5 day course of Dexamethasone. Continue symptomatic treatment with cough medications, rest, increased fluid intake, and tylenol/ibuprofen if needed.   I discussed the assessment and treatment plan with the patient. The patient was provided an opportunity to ask questions and all were answered. The patient agreed with the plan and demonstrated an understanding of the instructions.   The patient was advised to call back or seek an in-person evaluation if the symptoms worsen or if the condition fails to improve as anticipated.  20 minutes of non-face-to-face time was provided during this encounter.  Return if symptoms worsen or fail to improve.  Clearnce Sorrel, DNP, APRN, FNP-BC [facility]

## 2020-07-08 ENCOUNTER — Encounter: Payer: Self-pay | Admitting: Plastic Surgery

## 2020-07-08 ENCOUNTER — Ambulatory Visit: Payer: BC Managed Care – PPO | Admitting: Plastic Surgery

## 2020-07-08 ENCOUNTER — Other Ambulatory Visit: Payer: Self-pay

## 2020-07-08 VITALS — BP 120/82 | HR 57 | Temp 97.9°F | Ht 67.0 in | Wt 215.0 lb

## 2020-07-08 DIAGNOSIS — Z9013 Acquired absence of bilateral breasts and nipples: Secondary | ICD-10-CM

## 2020-07-08 DIAGNOSIS — Z9889 Other specified postprocedural states: Secondary | ICD-10-CM

## 2020-07-08 NOTE — Progress Notes (Signed)
Patient ID: Shannon May, female    DOB: [birth year], 49 y.o.   MRN: [id number]   Chief Complaint  Patient presents with  . Follow-up  . Breast Problem    The patient is a 49 year old female here for her 1 year follow-up from breast reconstruction.  Underwent bilateral mastectomies with expander placement in December 2019.  She had exchange to implants done in March 2020.  She had a Mentor smooth round high-profile gel 590 cc implants placed.  In June 2020 she had fat grafting for filling of the upper pole.  She then had tattoo placement by a lady in [place].  Since her last visit she has graduated and has her doctorate and music education.  She is doing very well overall.  She is 5 feet 7 inches tall and weighs 215 pounds.  This is a significant increase from her preoperative weight.  She is making strides on weight reduction.  She is willing to see the healthy weight and wellness physicians.  She has no sign of capsular contracture.  No lumps or bumps that are of any concern.  Her implants appear to be healthy and intact.  Her incisions are all healing very nicely.  She is interested in an ultrasound which has been recommended for every 3 years.   Review of Systems  Constitutional: Negative for activity change and appetite change.  Eyes: Negative.   Respiratory: Negative for chest tightness and shortness of breath.   Cardiovascular: Negative for leg swelling.  Gastrointestinal: Negative for abdominal distention.  Endocrine: Negative.   Genitourinary: Negative.   Musculoskeletal: Negative.   Skin: Negative.   Neurological: Negative.   Hematological: Negative.   Psychiatric/Behavioral: Negative.     Past Medical History:  Diagnosis Date  . Anxiety   . Diverticulosis   . Hypertension 11/08/2017  . Malignant neoplasm of upper-outer quadrant of left breast in female, estrogen receptor positive ([place]) 06/08/2018  . Sinusitis 11/15/2017    Past Surgical History:  Procedure  Laterality Date  . BREAST RECONSTRUCTION WITH PLACEMENT OF TISSUE EXPANDER AND ALLODERM N/A 08/14/2018   Procedure: BREAST RECONSTRUCTION WITH PLACEMENT OF TISSUE EXPANDER AND FLEXHD;  Surgeon: Wallace Going, DO;  Location: [facility];  Service: Plastics;  Laterality: N/A;  . BREAST SURGERY    . DILATION AND CURETTAGE OF UTERUS    . LASIK    . LIPOSUCTION Bilateral 11/15/2018   Procedure: LIPOSUCTION;  Surgeon: Wallace Going, DO;  Location: [facility];  Service: Plastics;  Laterality: Bilateral;  . LIPOSUCTION WITH LIPOFILLING Bilateral 02/22/2019   Procedure: LIPOSUCTION WITH LIPOFILLING;  Surgeon: Wallace Going, DO;  Location: [facility];  Service: Plastics;  Laterality: Bilateral;  2 hours, please  . MASTECTOMY W/ SENTINEL NODE BIOPSY Bilateral 08/14/2018   with breast reconstruction  . MASTECTOMY W/ SENTINEL NODE BIOPSY Bilateral 08/14/2018   Procedure: BILATERAL MASTECTOMIES  WITH LEFT SENTINEL LYMPH NODE MAPPING;  Surgeon: Jovita Kussmaul, MD;  Location: [facility];  Service: General;  Laterality: Bilateral;  . REMOVAL OF BILATERAL TISSUE EXPANDERS WITH PLACEMENT OF BILATERAL BREAST IMPLANTS Bilateral 11/15/2018   Procedure: removal of bilateral expanders and placement of bilateral silicone implants.;  Surgeon: Wallace Going, DO;  Location: [facility];  Service: Plastics;  Laterality: Bilateral;      Current Outpatient Medications:  .  Ascorbic Acid (VITAMIN C PO), Take by mouth., Disp: , Rfl:  .  dexamethasone (DECADRON) 6 MG tablet, Take 1 tablet (  6 mg total) by mouth 3 (three) times daily., Disp: 15 tablet, Rfl: 0 .  Docusate Calcium (STOOL SOFTENER PO), Take 1 tablet by mouth 2 (two) times daily. , Disp: , Rfl:  .  escitalopram (LEXAPRO) 5 MG tablet, Take 1 tablet (5 mg total) by mouth daily., Disp: 90 tablet, Rfl: 1 .  fluticasone (FLONASE) 50 MCG/ACT nasal spray, PLACE 2 SPRAYS INTO THE NOSE DAILY (Patient taking  differently: Place 2 sprays into both nostrils daily. ), Disp: 16 g, Rfl: 1 .  loratadine (CLARITIN) 10 MG tablet, Take 10 mg by mouth daily as needed for allergies., Disp: , Rfl:  .  losartan-hydrochlorothiazide (HYZAAR) 50-12.5 MG tablet, Take 1 tablet by mouth daily., Disp: 90 tablet, Rfl: 3 .  Multiple Vitamins-Minerals (MULTIVITAMIN WOMEN PO), Take 1 capsule by mouth daily., Disp: , Rfl:  .  Multiple Vitamins-Minerals (ZINC PO), Take by mouth., Disp: , Rfl:  .  Probiotic Product (PRO-BIOTIC BLEND PO), Take 1 tablet by mouth daily. , Disp: , Rfl:  .  promethazine-dextromethorphan (PROMETHAZINE-DM) 6.25-15 MG/5ML syrup, Take 5 mLs by mouth 4 (four) times daily as needed for cough. (Patient not taking: Reported on 07/03/2020), Disp: 118 mL, Rfl: 0   Objective:   Vitals:   07/08/20 1542  BP: 120/82  Pulse: (!) 57  Temp: 97.9 F (36.6 C)  SpO2: 98%    Physical Exam Vitals and nursing note reviewed.  Constitutional:      Appearance: Normal appearance.  HENT:     Head: Normocephalic and atraumatic.  Cardiovascular:     Rate and Rhythm: Normal rate.     Pulses: Normal pulses.  Pulmonary:     Effort: Pulmonary effort is normal. No respiratory distress.  Abdominal:     General: Abdomen is flat. There is no distension.  Neurological:     General: No focal deficit present.     Mental Status: She is alert and oriented to person, place, and time.  Psychiatric:        Mood and Affect: Mood normal.        Behavior: Behavior normal.        Thought Content: Thought content normal.     Assessment & Plan:  S/P breast reconstruction, bilateral  S/P mastectomy, bilateral I am so happy for the patient.  She is doing so well.  I definitely want to see her back in a year for 1 year follow-up.  She is interested in an abdominoplasty with liposuction.  This could be done at the same time but she has some fat filling.  We have referred her to the [facility] weight [facility] wellness center.  She is  going to let us know when she is ready for further discussion about surgery.  Next year we will plan on an ultrasound of her breasts.  Pictures were obtained of the patient and placed in the chart with the patient's or guardian's permission.  [place], DO

## 2020-07-10 ENCOUNTER — Emergency Department (HOSPITAL_COMMUNITY): Payer: BC Managed Care – PPO

## 2020-07-10 ENCOUNTER — Emergency Department (HOSPITAL_COMMUNITY)
Admission: EM | Admit: 2020-07-10 | Discharge: 2020-07-10 | Disposition: A | Discharge status: Home / Self Care | Payer: BC Managed Care – PPO | Attending: Emergency Medicine | Admitting: Emergency Medicine

## 2020-07-10 ENCOUNTER — Telehealth: Payer: Self-pay

## 2020-07-10 ENCOUNTER — Other Ambulatory Visit: Payer: Self-pay

## 2020-07-10 DIAGNOSIS — R079 Chest pain, unspecified: Secondary | ICD-10-CM

## 2020-07-10 DIAGNOSIS — I959 Hypotension, unspecified: Secondary | ICD-10-CM

## 2020-07-10 DIAGNOSIS — H538 Other visual disturbances: Secondary | ICD-10-CM

## 2020-07-10 DIAGNOSIS — R0981 Nasal congestion: Secondary | ICD-10-CM

## 2020-07-10 DIAGNOSIS — R42 Dizziness and giddiness: Secondary | ICD-10-CM | POA: Diagnosis not present

## 2020-07-10 DIAGNOSIS — Z87891 Personal history of nicotine dependence: Secondary | ICD-10-CM | POA: Insufficient documentation

## 2020-07-10 DIAGNOSIS — R519 Headache, unspecified: Secondary | ICD-10-CM | POA: Diagnosis not present

## 2020-07-10 DIAGNOSIS — Z853 Personal history of malignant neoplasm of breast: Secondary | ICD-10-CM | POA: Diagnosis not present

## 2020-07-10 DIAGNOSIS — R072 Precordial pain: Secondary | ICD-10-CM | POA: Diagnosis present

## 2020-07-10 DIAGNOSIS — Z79899 Other long term (current) drug therapy: Secondary | ICD-10-CM | POA: Insufficient documentation

## 2020-07-10 DIAGNOSIS — R55 Syncope and collapse: Secondary | ICD-10-CM | POA: Diagnosis not present

## 2020-07-10 DIAGNOSIS — I10 Essential (primary) hypertension: Secondary | ICD-10-CM | POA: Diagnosis not present

## 2020-07-10 LAB — PROTIME-INR
INR: 1.1 (ref 0.8–1.2)
Prothrombin Time: 13.6 seconds (ref 11.4–15.2)

## 2020-07-10 LAB — BASIC METABOLIC PANEL
Anion gap: 10 (ref 5–15)
BUN: 17 mg/dL (ref 6–20)
CO2: 26 mmol/L (ref 22–32)
Calcium: 9 mg/dL (ref 8.9–10.3)
Chloride: 98 mmol/L (ref 98–111)
Creatinine, Ser: 0.84 mg/dL (ref 0.44–1.00)
GFR, Estimated: >60 mL/min (ref >60)
Glucose, Bld: 89 mg/dL (ref 70–99)
Potassium: 4 mmol/L (ref 3.5–5.1)
Sodium: 134 mmol/L — ABNORMAL LOW (ref 135–145)

## 2020-07-10 LAB — D-DIMER, QUANTITATIVE: D-Dimer, Quant: 0.7 ug/mL-FEU — ABNORMAL HIGH (ref 0.00–0.50)

## 2020-07-10 LAB — CBC
HCT: 41.2 % (ref 36.0–46.0)
Hemoglobin: 13.7 g/dL (ref 12.0–15.0)
MCH: 32.5 pg (ref 26.0–34.0)
MCHC: 33.3 g/dL (ref 30.0–36.0)
MCV: 97.6 fL (ref 80.0–100.0)
Platelets: 376 10*3/uL (ref 150–400)
RBC: 4.22 MIL/uL (ref 3.87–5.11)
RDW: 12.5 % (ref 11.5–15.5)
WBC: 11.8 10*3/uL — ABNORMAL HIGH (ref 4.0–10.5)
nRBC: 0 % (ref 0.0–0.2)

## 2020-07-10 LAB — LACTIC ACID, PLASMA: Lactic Acid, Venous: 1.4 mmol/L (ref 0.5–1.9)

## 2020-07-10 LAB — HEPATIC FUNCTION PANEL
ALT: 22 U/L (ref 0–44)
AST: 21 U/L (ref 15–41)
Albumin: 3.9 g/dL (ref 3.5–5.0)
Alkaline Phosphatase: 58 U/L (ref 38–126)
Bilirubin, Direct: 0.2 mg/dL (ref 0.0–0.2)
Indirect Bilirubin: 0.6 mg/dL (ref 0.3–0.9)
Total Bilirubin: 0.8 mg/dL (ref 0.3–1.2)
Total Protein: 6.9 g/dL (ref 6.5–8.1)

## 2020-07-10 LAB — I-STAT BETA HCG BLOOD, ED (MC, WL, AP ONLY): I-stat hCG, quantitative: <5 m[IU]/mL (ref <5)

## 2020-07-10 LAB — TROPONIN I (HIGH SENSITIVITY)
Troponin I (High Sensitivity): 3 ng/L (ref <18)
Troponin I (High Sensitivity): 3 ng/L (ref <18)

## 2020-07-10 LAB — MAGNESIUM: Magnesium: 2.2 mg/dL (ref 1.7–2.4)

## 2020-07-10 LAB — LIPASE, BLOOD: Lipase: 36 U/L (ref 11–51)

## 2020-07-10 LAB — TSH: TSH: 3.457 u[IU]/mL (ref 0.350–4.500)

## 2020-07-10 MED ORDER — GADOBUTROL 1 MMOL/ML IV SOLN
9.8000 mL | Freq: Once | INTRAVENOUS | Status: AC | PRN
  Administered 2020-07-10: 9.8 mL via INTRAVENOUS

## 2020-07-10 MED ORDER — SODIUM CHLORIDE 0.9 % IV BOLUS
1000.0000 mL | Freq: Once | INTRAVENOUS | Status: AC
  Administered 2020-07-10: 1000 mL via INTRAVENOUS

## 2020-07-10 MED ORDER — IOHEXOL 350 MG/ML SOLN
100.0000 mL | Freq: Once | INTRAVENOUS | Status: AC | PRN
  Administered 2020-07-10: 100 mL via INTRAVENOUS

## 2020-07-10 NOTE — ED Triage Notes (Signed)
Pt reports "not feeling right" since 10/27, after being dx with covid on 10/17. Has since recovered from covid and is back to work but reports tingling in her chest and hypotension, although pt is hypertensive in triage. Called PCP about holding/changing dose of Losartan today and was advised to come to the ED.

## 2020-07-10 NOTE — Telephone Encounter (Signed)
Agree with documentation as above.   Jaleeyah Munce, MD  

## 2020-07-10 NOTE — Telephone Encounter (Signed)
Patient called with reports of "just not feeling good". Patient states she took her blood pressure last night and her systolic was 96. Patient does not recall dyastolic nor heart rate, but does report they were low. She was diagnosed with COVID in October but has since returned to work. Has had visits with our providers concerning not feeling well post COVID.   Patient was audibly not feeling well and her voice seemed to be shaky and uneasy. Patient stated "I just don't feel well" and "I feel like I am at the bottom of a roller coaster, and I am being drug down". I advised patient to see evaluation at local emergency room. I advised patient to have someone take her- not recommending her to drive. Patient agreeable- will have her mother take her to seek care at local emergency room.   FYI to PCP

## 2020-07-10 NOTE — Discharge Instructions (Signed)
It was our pleasure to provide your ER care today - we hope that you feel better.  Rest. Drink plenty of fluids and get adequate/good nutrition.   Hold your blood pressure medication for now, and follow up with your doctor for recheck as relates your recent symptoms and blood pressure in the next few days - call office tomorrow AM to arrange follow up.  Return to ER if worse, new symptoms, new or severe pain, fevers, chest pain, trouble breathing, severe sinus pain and/or severe sinus headache, fainting, or other concern.

## 2020-07-10 NOTE — ED Provider Notes (Signed)
Signed out by Dr Sherry Ruffing, if CTA PE and MRI neg for acute process, d/c to home.   Recheck pt, bp 150/88, pulse 86, rr 14.    No current pain or discomfort. No cp. No sob or increased wob. No headache.   Discussed MRI and CT w pt. Mri w incidental note mild sinus cong/dis - pt notes prior hx sinus issues. +sinus congestion during recent covid infection, denies current sinus pain/pressure or drainage, no fever.   Pt currently appears stable for d/c.   rec close pcp f/u.  Return precautions provided.      Lajean Saver, MD 07/10/20 2203

## 2020-07-10 NOTE — ED Provider Notes (Signed)
[facility] EMERGENCY DEPARTMENT Provider Note   CSN: [id number] Arrival date & time: 07/10/20  1131     History No chief complaint on file.   Shannon May is a 49 y.o. female.  The history is provided by the patient and medical records. No language interpreter was used.  Chest Pain Pain location:  Substernal area Pain quality: aching and pressure   Pain radiates to:  Does not radiate Pain severity:  Moderate Onset quality:  Gradual Duration:  3 days Timing:  Intermittent Progression:  Waxing and waning Chronicity:  New Worsened by:  Nothing Ineffective treatments:  None tried Associated symptoms: fatigue, headache and near-syncope   Associated symptoms: no abdominal pain, no altered mental status, no back pain, no cough, no diaphoresis, no dizziness, no fever, no lower extremity edema, no nausea, no numbness, no palpitations, no shortness of breath, no syncope, no vomiting and no weakness   Risk factors: hypertension   Risk factors: not female        Past Medical History:  Diagnosis Date  . Anxiety   . Diverticulosis   . Hypertension 11/08/2017  . Malignant neoplasm of upper-outer quadrant of left breast in female, estrogen receptor positive ([place]) 06/08/2018  . Sinusitis 11/15/2017    Patient Active Problem List   Diagnosis Date Noted  . 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 Depression, major, single episode, mild ([place]) 06/06/2020  . Stress 06/08/2019  . S/P breast reconstruction, bilateral 12/22/2018  . S/P mastectomy, bilateral 09/15/2018  . Acquired absence of breast 08/22/2018  . Breast cancer ([place]) 08/14/2018  . 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 Symptomatic mammary hypertrophy 06/08/2018  . Malignant neoplasm of upper-outer quadrant of left breast in female, estrogen receptor positive ([place]) 06/08/2018  . Nodule of ear canal, bilateral 11/15/2017  . Hypertension 11/08/2017  . Venous stasis 05/13/2016  . Lumbar  radiculitis 05/23/2014  . Left subacromial bursitis 11/30/2012  . DERMATITIS, 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/06/2010  . HEADACHE 04/06/2010  . HEEL PAIN, RIGHT 03/12/2009  . ALLERGIC RHINITIS 05/09/2008    Past Surgical History:  Procedure Laterality Date  . BREAST RECONSTRUCTION WITH PLACEMENT OF TISSUE EXPANDER AND ALLODERM N/A 08/14/2018   Procedure: BREAST RECONSTRUCTION WITH PLACEMENT OF TISSUE EXPANDER AND FLEXHD;  Surgeon: Wallace Going, DO;  Location: [facility];  Service: Plastics;  Laterality: N/A;  . BREAST SURGERY    . DILATION AND CURETTAGE OF UTERUS    . LASIK    . LIPOSUCTION Bilateral 11/15/2018   Procedure: LIPOSUCTION;  Surgeon: Wallace Going, DO;  Location: [facility];  Service: Plastics;  Laterality: Bilateral;  . LIPOSUCTION WITH LIPOFILLING Bilateral 02/22/2019   Procedure: LIPOSUCTION WITH LIPOFILLING;  Surgeon: Wallace Going, DO;  Location: [facility];  Service: Plastics;  Laterality: Bilateral;  2 hours, please  . MASTECTOMY W/ SENTINEL NODE BIOPSY Bilateral 08/14/2018   with breast reconstruction  . MASTECTOMY W/ SENTINEL NODE BIOPSY Bilateral 08/14/2018   Procedure: BILATERAL MASTECTOMIES  WITH LEFT SENTINEL LYMPH NODE MAPPING;  Surgeon: Jovita Kussmaul, MD;  Location: [facility];  Service: General;  Laterality: Bilateral;  . REMOVAL OF BILATERAL TISSUE EXPANDERS WITH PLACEMENT OF BILATERAL BREAST IMPLANTS Bilateral 11/15/2018   Procedure: removal of bilateral expanders and placement of bilateral silicone implants.;  Surgeon: Wallace Going, DO;  Location: [facility];  Service: Plastics;  Laterality: Bilateral;     OB History    Gravida  5   Para  3   Term      Preterm      AB  2   Living  3     SAB  1   TAB  1   Ectopic      Multiple      Live Births              Family History  Problem Relation Age of Onset  . Colon cancer Maternal Grandfather   . Melanoma Paternal Grandfather   . Heart  attack Maternal Grandmother   . Hypertension Mother   . Hyperlipidemia Father   . Vaginal cancer Maternal Aunt     Social History   Tobacco Use  . Smoking status: Former Smoker    Types: Cigarettes    Quit date: 06/13/1988    Years since quitting: 32.0  . Smokeless tobacco: Never Used  . Tobacco comment: She smoked a couple of months in her teens.   Vaping Use  . Vaping Use: Never used  Substance Use Topics  . Alcohol use: Yes    Alcohol/week: 3.0 standard drinks    Types: 3 Glasses of wine per week  . Drug use: No    Home Medications Prior to Admission medications   Medication Sig Start Date End Date Taking? Authorizing Provider  Ascorbic Acid (VITAMIN C PO) Take by mouth.    [provider]  dexamethasone (DECADRON) 6 MG tablet Take 1 tablet (6 mg total) by mouth 3 (three) times daily. 07/03/20   Samuel Bouche, NP  Docusate Calcium (STOOL SOFTENER PO) Take 1 tablet by mouth 2 (two) times daily.     [provider]  escitalopram (LEXAPRO) 5 MG tablet Take 1 tablet (5 mg total) by mouth daily. 03/17/20   Hali Marry, MD  fluticasone (FLONASE) 50 MCG/ACT nasal spray PLACE 2 SPRAYS INTO THE NOSE DAILY Patient taking differently: Place 2 sprays into both nostrils daily.  04/24/14   Breeback, Jade L, PA-C  loratadine (CLARITIN) 10 MG tablet Take 10 mg by mouth daily as needed for allergies.    [provider]  losartan-hydrochlorothiazide (HYZAAR) 50-12.5 MG tablet Take 1 tablet by mouth daily. 12/06/19   Hali Marry, MD  Multiple Vitamins-Minerals (MULTIVITAMIN WOMEN PO) Take 1 capsule by mouth daily.    [provider]  Multiple Vitamins-Minerals (ZINC PO) Take by mouth.    [provider]  Probiotic Product (PRO-BIOTIC BLEND PO) Take 1 tablet by mouth daily.     [provider]  promethazine-dextromethorphan (PROMETHAZINE-DM) 6.25-15 MG/5ML syrup Take 5 mLs by mouth 4 (four) times daily as needed for  cough. Patient not taking: Reported on 07/03/2020 06/24/20   Samuel Bouche, NP    Allergies    Antihistamines, chlorpheniramine-type; Sudafed [pseudoephedrine hcl]; Sulfamethoxazole; and Erythromycin  Review of Systems   Review of Systems  Constitutional: Positive for fatigue. Negative for chills, diaphoresis and fever.  HENT: Negative for congestion.   Eyes: Positive for visual disturbance. Negative for photophobia.  Respiratory: Negative for cough, chest tightness, shortness of breath, wheezing and stridor.   Cardiovascular: Positive for chest pain and near-syncope. Negative for palpitations, leg swelling and syncope.  Gastrointestinal: Positive for constipation. Negative for abdominal pain, diarrhea, nausea and vomiting.  Genitourinary: Negative for dysuria, enuresis and flank pain.  Musculoskeletal: Negative for back pain.  Neurological: Positive for light-headedness and headaches. Negative for dizziness, seizures, syncope, weakness and numbness.  Psychiatric/Behavioral: Negative for agitation and confusion.  All other systems reviewed and are negative.  Physical Exam Updated Vital Signs BP 140/90   Pulse 75   Temp 98.2 F (36.8 C) (Oral)   Resp 16   Ht 5\' 7"  (1.702 m)   Wt 98.9 kg   SpO2 99%   BMI 34.14 kg/m   Physical Exam Vitals and nursing note reviewed.  Constitutional:      General: She is not in acute distress.    Appearance: Normal appearance. She is well-developed. She is not ill-appearing, toxic-appearing or diaphoretic.  HENT:     Head: Normocephalic and atraumatic.     Nose: No congestion or rhinorrhea.     Mouth/Throat:     Mouth: Mucous membranes are dry.     Pharynx: No oropharyngeal exudate or posterior oropharyngeal erythema.  Eyes:     Extraocular Movements: Extraocular movements intact.     Right eye: Normal extraocular motion.     Left eye: Normal extraocular motion.     Conjunctiva/sclera: Conjunctivae normal.     Pupils: Pupils are equal,  round, and reactive to light.     Comments: Reported blurriness on the medial vision of the left eye.  Visual fields otherwise intact.  Cardiovascular:     Rate and Rhythm: Normal rate and regular rhythm.     Pulses: Normal pulses.     Heart sounds: No murmur heard.   Pulmonary:     Effort: Pulmonary effort is normal. No respiratory distress.     Breath sounds: Normal breath sounds. No wheezing, rhonchi or rales.  Chest:     Chest wall: No tenderness.  Abdominal:     General: Abdomen is flat.     Palpations: Abdomen is soft.     Tenderness: There is no abdominal tenderness. There is no right CVA tenderness, left CVA tenderness, guarding or rebound.  Musculoskeletal:        General: No tenderness.     Cervical back: Neck supple. No tenderness.     Right lower leg: No edema.     Left lower leg: No edema.  Skin:    General: Skin is warm and dry.     Capillary Refill: Capillary refill takes less than 2 seconds.     Findings: No erythema or rash.  Neurological:     Mental Status: She is alert and oriented to person, place, and time.     Cranial Nerves: No cranial nerve deficit.     Sensory: No sensory deficit.     Motor: No weakness.     Coordination: Coordination normal.  Psychiatric:        Mood and Affect: Mood normal.     ED Results / Procedures / Treatments   Labs (all labs ordered are listed, but only abnormal results are displayed) Labs Reviewed  BASIC METABOLIC PANEL - Abnormal; Notable for the following components:      Result Value   Sodium 134 (*)    All other components within normal limits  CBC - Abnormal; Notable for the following components:   WBC 11.8 (*)    All other components within normal limits  D-DIMER, QUANTITATIVE (NOT AT [facility]) - Abnormal; Notable for the following components:   D-Dimer, Quant 5.89 (*)    All other components within normal limits  HEPATIC FUNCTION PANEL  LIPASE, BLOOD  LACTIC ACID, PLASMA  MAGNESIUM  TSH  PROTIME-INR  LACTIC  ACID, PLASMA  URINALYSIS, ROUTINE W REFLEX MICROSCOPIC  I-STAT BETA HCG BLOOD, ED ([facility], [facility], [facility] ONLY)  TROPONIN I (HIGH SENSITIVITY)  TROPONIN I (HIGH  SENSITIVITY)    EKG EKG Interpretation  Date/Time:  Thursday July 10 2020 11:36:05 EDT Ventricular Rate:  68 PR Interval:  140 QRS Duration: 84 QT Interval:  396 QTC Calculation: 421 R Axis:   68 Text Interpretation: Normal sinus rhythm Normal ECG When compared to prior, no significant changes seen. No STEMI Confirmed by Antony Blackbird [id number]) on 07/10/2020 1:19:26 PM   Radiology DG Chest 2 View  Result Date: 07/10/2020 CLINICAL DATA:  Chest pain. EXAM: CHEST - 2 VIEW COMPARISON:  Prior chest radiographs 07/03/2020 and earlier. FINDINGS: Heart size within normal limits. There is no appreciable airspace consolidation. No evidence of pleural effusion or pneumothorax. No acute bony abnormality identified. Surgical clips project in the region of the left axilla. IMPRESSION: No evidence of acute cardiopulmonary abnormality. Electronically Signed   By: Kellie Simmering DO   On: 07/10/2020 12:05    Procedures Procedures (including critical care time)  Medications Ordered in ED Medications  gadobutrol (GADAVIST) 1 MMOL/ML injection 9.8 mL (has no administration in time range)  sodium chloride 0.9 % bolus 1,000 mL (1,000 mLs Intravenous New Bag/Given 07/10/20 1536)    ED Course  I have reviewed the triage vital signs and the nursing notes.  Pertinent labs & imaging results that were available during my care of the patient were reviewed by me and considered in my medical decision making (see chart for details).    MDM Rules/Calculators/A&P                          TAMALYN WADSWORTH is a 49 y.o. female with a past medical history significant for hypertension, prior breast cancer, recent coronavirus, and prior headaches who presents with 3 days of vision abnormality in her medial left vision, severe lightheadedness, fatigue, episodes of  hypotension, and chest pressure.  Patient reports that she has not felt right for the last few days.  She reports that she was driving and felt she might pass out.  She reports some chest pressure that she describes as a 4 out of 10 in severity.  It comes and goes.  It is not necessarily pleuritic or exertional but it is waxing and waning.  She reports she is also had some headaches which she has chronically but over the last few days has noticed some vision changes and only her left eye.  She reports it is primarily in the medial vision in her left eye it is blurry.  She denies any actual eye pain.  She denies any trauma.  She reports no significant shortness of breath or worsening cough.  She denies any diarrhea but does report some constipation.  She reports no nausea, vomiting, abdominal pain, or back pain.  No leg pain or leg swelling that is new.  No history of DVT or PE but she does have a history of cancer.  On exam, lungs are clear and chest is nontender.  I cannot reproduce the discomfort she was feeling with pressure.  Good pulses in extremities.  No lower extremity tenderness or swelling.  No focal neurologic deficits on my exam.  Patient had intact visual fields and normal extraocular movements.  Pupils are symmetric and reactive.  Fundus was difficult to see on either eye with bedside funduscope.  Otherwise reassuring exam.  Patient's blood pressure was however in the 90s during her initial evaluation.  EKG shows no STEMI.  Clinically I am somewhat concerned about the patient's recent Covid, prior breast cancer,  and the chest pain with hypotension.  We discussed to get a D-dimer to help rule out pulmonary embolism.  If this is positive, we will get a PE study.  With her medial left eye vision loss, neurology felt we should get MRI with and without of the head and orbit to look for some sort of ischemic or other abnormality causing the vision changes.  We will get chest x-ray with the discomfort  as well as other labs.  We will give her some fluids for possible dehydration.  This may all be just dehydration leading to constipation and lightheadedness with near syncope and fatigue however we will look for other etiologies.  Patient agrees with plan of care and work-up.  Care transferred to oncoming team awaiting for results of work-up.  D-dimer is elevated, CT PE study ordered.  Patient awaiting results of work-up to determine disposition.  She has received fluids to help with low blood pressures.  If needed, anticipate further rehydration with fluids if she is still hypotensive.   Final Clinical Impression(s) / ED Diagnoses Final diagnoses:  Chest pain, unspecified type  Blurry vision, left eye  Hypotension, unspecified hypotension type  Lightheaded  Near syncope    Clinical Impression: 1. Chest pain, unspecified type   2. Blurry vision, left eye   3. Hypotension, unspecified hypotension type   4. Lightheaded   5. Near syncope     Disposition: Care transferred, team while waiting for work-up to be completed.  This note was prepared with assistance of Systems analyst. Occasional wrong-word or sound-a-like substitutions may have occurred due to the inherent limitations of voice recognition software.     Mckinzy Fuller, Gwenyth Allegra, MD 07/10/20 [phone]

## 2020-07-12 ENCOUNTER — Telehealth: Payer: Self-pay | Admitting: Medical-Surgical

## 2020-07-12 NOTE — Telephone Encounter (Signed)
Contacted by call service regarding patient question. On 11/4, she contacted the office with reports of low BP and "not feeling right". She was advised to go to the ED for further evaluation. At the ED, she had a full battery of tests and was advised on discharge to only take 1/2 tablet of her Losartan-HCTZ 50-12.5mg  daily until follow up with her PCP. Afterward, she spoke to someone at our office and was instructed to not take her BP at all until her follow up. Tonight, she called the on-call service with reports of her BP being 170s/90s and wants to know if she can take at least 1/2 of her BP medication. She is not having any other symptoms at this time. In the ED her sodium was 134 but she reports this is because she had been drinking lots of water in an effort to get her BP up. Advised that she is okay to take 1/2 tablet of her Losartan-HCTZ today and tomorrow. She has a follow up appointment scheduled on Tuesday with her PCP but advised she contact the office on Monday for further instruction regarding her BP medication. Recommended she continue to monitor her BP and watch for emergency symptoms including shortness of breath, chest pain, dizziness, confusion, headaches, nausea, and stroke s/s. Also advised to avoid excess water consumption to prevent hyponatremia.

## 2020-07-14 ENCOUNTER — Telehealth (INDEPENDENT_AMBULATORY_CARE_PROVIDER_SITE_OTHER): Payer: BC Managed Care – PPO | Admitting: Family Medicine

## 2020-07-14 ENCOUNTER — Encounter: Payer: Self-pay | Admitting: Family Medicine

## 2020-07-14 VITALS — BP 154/76

## 2020-07-14 DIAGNOSIS — I10 Essential (primary) hypertension: Secondary | ICD-10-CM | POA: Diagnosis not present

## 2020-07-14 DIAGNOSIS — U071 COVID-19: Secondary | ICD-10-CM | POA: Diagnosis not present

## 2020-07-14 DIAGNOSIS — E871 Hypo-osmolality and hyponatremia: Secondary | ICD-10-CM

## 2020-07-14 DIAGNOSIS — D72829 Elevated white blood cell count, unspecified: Secondary | ICD-10-CM | POA: Diagnosis not present

## 2020-07-14 NOTE — Telephone Encounter (Signed)
Agree with documentation as above.   Jeneane Pieczynski, MD  

## 2020-07-14 NOTE — Progress Notes (Signed)
Virtual Visit via Video Note  I connected with Shannon May on 07/14/20 at 11:30 AM EST by a video enabled telemedicine application and verified that I am speaking with the correct person using two identifiers.   I discussed the limitations of evaluation and management by telemedicine and the availability of in person appointments. The patient expressed understanding and agreed to proceed.  Patient location: at a conference.   Provider location: in office  Subjective:    CC: BP   HPI: She wanted to follow-up on her blood pressure.  He was previously well controlled on her blood pressure regimen.  But more recently was diagnosed with Covid in mid-October did receive the monoclonal antibody infusion.  And since then her blood pressure has been all over the place.  In fact on the fourth she had called our office because she felt extremely fatigued and weak and was just like something was drawing her down.  She was encouraged to go to the emergency department because she was also having some chest discomfort.  Her blood pressure was extremely low that morning, 90/51, but by the time she got to the emergency department the blood pressure was elevated at 150/88.  They encouraged her to restart her blood pressure medication at half a tab.  Blood pressures have still been a little elevated over the weekend in fact it was elevated last night but was a little bit better this morning.  She has been taking a half of a pill.  She denies any dizziness.  She does have a headache today.  Actually has been walking 3 miles per day and doing really well with that.   Past medical history, Surgical history, Family history not pertinant except as noted below, Social history, Allergies, and medications have been entered into the medical record, reviewed, and corrections made.   Review of Systems: No fevers, chills, night sweats, weight loss, chest pain, or shortness of breath.   Objective:    General: Speaking  clearly in complete sentences without any shortness of breath.  Alert and oriented x3.  Normal judgment. No apparent acute distress.    Impression and Recommendations:    No problem-specific Assessment & Plan notes found for this encounter.  Hypertension-not well controlled today based on her home cuff.  But doing well with a half a tab.  We discussed taking a half a tab twice a day that we would be easy to manipulate she could either skip a dose of blood pressure is too low or make sure she takes both doses if blood pressure is little high that day it might allow is a little bit more flexibility though it does have a diuretic in it and to take that at bedtime but it is a low dose.  She has a follow-up later this week so we will she had she is doing at that point in time.  She did have blood work done on November 4 as well.  D-dimer was mildly elevated but she did have a negative CT of the chest.  Lipase and magnesium were normal.  Thyroid and liver function were normal.  Opponent was negative.  She did have a small bump in her white blood cell count at 11.8 so would like to recheck that at her follow-up appointment.  Also her sodium was just borderline low at 134 so we will plan to recheck that on Thursday as well.  Will order BMP and CBC at that time.    Time spent  in encounter 20 minutes  I discussed the assessment and treatment plan with the patient. The patient was provided an opportunity to ask questions and all were answered. The patient agreed with the plan and demonstrated an understanding of the instructions.   The patient was advised to call back or seek an in-person evaluation if the symptoms worsen or if the condition fails to improve as anticipated.   Beatrice Lecher, MD

## 2020-07-17 ENCOUNTER — Encounter: Payer: Self-pay | Admitting: Family Medicine

## 2020-07-17 ENCOUNTER — Ambulatory Visit (INDEPENDENT_AMBULATORY_CARE_PROVIDER_SITE_OTHER): Payer: BC Managed Care – PPO | Admitting: Family Medicine

## 2020-07-17 VITALS — BP 129/83 | HR 81 | Ht 67.0 in | Wt 217.0 lb

## 2020-07-17 DIAGNOSIS — E871 Hypo-osmolality and hyponatremia: Secondary | ICD-10-CM | POA: Diagnosis not present

## 2020-07-17 DIAGNOSIS — D72829 Elevated white blood cell count, unspecified: Secondary | ICD-10-CM

## 2020-07-17 DIAGNOSIS — J0100 Acute maxillary sinusitis, unspecified: Secondary | ICD-10-CM

## 2020-07-17 DIAGNOSIS — I10 Essential (primary) hypertension: Secondary | ICD-10-CM

## 2020-07-17 MED ORDER — AMOXICILLIN-POT CLAVULANATE 875-125 MG PO TABS: 1.0000 | ORAL_TABLET | Freq: Two times a day (BID) | ORAL | 0 refills | Status: DC

## 2020-07-17 NOTE — Progress Notes (Signed)
Established Patient Office Visit  Subjective:  Patient ID: Shannon May, female    DOB: [birth year]  Age: 49 y.o. MRN: [id number]  CC:  Chief Complaint  Patient presents with  . Hospitalization Follow-up    HPI Shannon May presents for abnormal blood pressures.  She had been taking her blood pressure normally and then suddenly felt like her blood pressure was dropping out.  She was seen in the emergency department and her blood pressure was a little bit more regulated by the time she got there but they recommended that she hold her pill.  Then it started going too high.  She is now taking losartan one half twice a day and that seems to be working a little bit better but still feels like they are a little bit erratic still seeing some high pressures at home she is just not sure which change besides the fact that she recently had Covid in fact she has been trying to walk consistently for exercise.  She has noticed a little bit of vertigo at night particularly when she lays down or tries to turn over.  She also reports that she has had a lot of nasal congestion for the last week.  She starting to get some pain into her jaw and maxillary facial area.  She normally does nasal saline to irrigate her sinuses but is at a conference and so was unable to do it for a couple of days and feels like everything is just gotten worse she is not had a fever chills or sweats.  No cough.  Past Medical History:  Diagnosis Date  . Anxiety   . Diverticulosis   . Hypertension 11/08/2017  . Malignant neoplasm of upper-outer quadrant of left breast in female, estrogen receptor positive ([place]) 06/08/2018  . Sinusitis 11/15/2017    Past Surgical History:  Procedure Laterality Date  . BREAST RECONSTRUCTION WITH PLACEMENT OF TISSUE EXPANDER AND ALLODERM N/A 08/14/2018   Procedure: BREAST RECONSTRUCTION WITH PLACEMENT OF TISSUE EXPANDER AND FLEXHD;  Surgeon: Wallace Going, DO;  Location: [place];   Service: Plastics;  Laterality: N/A;  . BREAST SURGERY    . DILATION AND CURETTAGE OF UTERUS    . LASIK    . LIPOSUCTION Bilateral 11/15/2018   Procedure: LIPOSUCTION;  Surgeon: Wallace Going, DO;  Location: [place];  Service: Plastics;  Laterality: Bilateral;  . LIPOSUCTION WITH LIPOFILLING Bilateral 02/22/2019   Procedure: LIPOSUCTION WITH LIPOFILLING;  Surgeon: Wallace Going, DO;  Location: [place];  Service: Plastics;  Laterality: Bilateral;  2 hours, please  . MASTECTOMY W/ SENTINEL NODE BIOPSY Bilateral 08/14/2018   with breast reconstruction  . MASTECTOMY W/ SENTINEL NODE BIOPSY Bilateral 08/14/2018   Procedure: BILATERAL MASTECTOMIES  WITH LEFT SENTINEL LYMPH NODE MAPPING;  Surgeon: Jovita Kussmaul, MD;  Location: [place];  Service: General;  Laterality: Bilateral;  . REMOVAL OF BILATERAL TISSUE EXPANDERS WITH PLACEMENT OF BILATERAL BREAST IMPLANTS Bilateral 11/15/2018   Procedure: removal of bilateral expanders and placement of bilateral silicone implants.;  Surgeon: Wallace Going, DO;  Location: [place];  Service: Plastics;  Laterality: Bilateral;    Family History  Problem Relation Age of Onset  . Colon cancer Maternal Grandfather   . Melanoma Paternal Grandfather   . Heart attack Maternal Grandmother   . Hypertension Mother   . Hyperlipidemia Father   . Vaginal cancer Maternal Aunt     Social History   Socioeconomic  History  . Marital status: Married    Spouse name: Ronalee Belts  . Number of children: 3  . Years of education: Masters  . Highest education level: Not on file  Occupational History  . Occupation: Education officer, museum    Comment: [employer]    Employer: [employer]  Tobacco Use  . Smoking status: Former Smoker    Types: Cigarettes    Quit date: 06/13/1988    Years since quitting: 32.1  . Smokeless tobacco: Never Used  . Tobacco comment: She smoked a couple of months in her teens.    Vaping Use  . Vaping Use: Never used  Substance and Sexual Activity  . Alcohol use: Yes    Alcohol/week: 3.0 standard drinks    Types: 3 Glasses of wine per week  . Drug use: No  . Sexual activity: Yes    Partners: Male  Other Topics Concern  . Not on file  Social History Narrative   Treadmill 5 days per week 2 caffeine drinks per day.    Social Determinants of Health   Financial Resource Strain:   . Difficulty of Paying Living Expenses: Not on file  Food Insecurity:   . Worried About Charity fundraiser in the Last Year: Not on file  . Ran Out of Food in the Last Year: Not on file  Transportation Needs:   . Lack of Transportation (Medical): Not on file  . Lack of Transportation (Non-Medical): Not on file  Physical Activity:   . Days of Exercise per Week: Not on file  . Minutes of Exercise per Session: Not on file  Stress:   . Feeling of Stress : Not on file  Social Connections:   . Frequency of Communication with Friends and Family: Not on file  . Frequency of Social Gatherings with Friends and Family: Not on file  . Attends Religious Services: Not on file  . Active Member of Clubs or Organizations: Not on file  . Attends Archivist Meetings: Not on file  . Marital Status: Not on file  Intimate Partner Violence:   . Fear of Current or Ex-Partner: Not on file  . Emotionally Abused: Not on file  . Physically Abused: Not on file  . Sexually Abused: Not on file    Outpatient Medications Prior to Visit  Medication Sig Dispense Refill  . acetaminophen (TYLENOL) 500 MG tablet Take 1,000 mg by mouth every 6 (six) hours as needed for moderate pain.    . Ascorbic Acid (VITAMIN C PO) Take 1 tablet by mouth daily.     Marland Kitchen aspirin EC 81 MG tablet Take 81 mg by mouth daily. Swallow whole.    . cholecalciferol (VITAMIN D3) 25 MCG (1000 UNIT) tablet Take 1,000 Units by mouth daily.    Mariane Baumgarten Calcium (STOOL SOFTENER PO) Take 1 tablet by mouth 2 (two) times daily.     Marland Kitchen  escitalopram (LEXAPRO) 5 MG tablet Take 1 tablet (5 mg total) by mouth daily. 90 tablet 1  . fluticasone (FLONASE) 50 MCG/ACT nasal spray PLACE 2 SPRAYS INTO THE NOSE DAILY (Patient taking differently: Place 2 sprays into both nostrils daily. ) 16 g 1  . ibuprofen (ADVIL) 200 MG tablet Take 200 mg by mouth every 6 (six) hours as needed for moderate pain.    Marland Kitchen loratadine (CLARITIN) 10 MG tablet Take 10 mg by mouth daily.     Marland Kitchen losartan-hydrochlorothiazide (HYZAAR) 50-12.5 MG tablet Take 1 tablet by mouth daily. 90 tablet  3  . Multiple Vitamins-Minerals (MULTIVITAMIN WOMEN PO) Take 1 capsule by mouth daily.    . Probiotic Product (PRO-BIOTIC BLEND PO) Take 1 tablet by mouth daily.     Marland Kitchen zinc gluconate 50 MG tablet Take 50 mg by mouth daily.     No facility-administered medications prior to visit.    Allergies  Allergen Reactions  . Antihistamines, Chlorpheniramine-Type Shortness Of Breath    Makes heart race  . Sudafed [Pseudoephedrine Hcl]     shaking  . Sulfamethoxazole Nausea And Vomiting  . Erythromycin Nausea Only    ROS Review of Systems    Objective:    Physical Exam Constitutional:      Appearance: She is well-developed.  HENT:     Head: Normocephalic and atraumatic.     Right Ear: Tympanic membrane, ear canal and external ear normal.     Left Ear: Tympanic membrane, ear canal and external ear normal.     Nose: Nose normal.     Mouth/Throat:     Pharynx: No oropharyngeal exudate or posterior oropharyngeal erythema.  Eyes:     Conjunctiva/sclera: Conjunctivae normal.     Pupils: Pupils are equal, round, and reactive to light.  Neck:     Thyroid: No thyromegaly.  Cardiovascular:     Rate and Rhythm: Normal rate and regular rhythm.     Heart sounds: Normal heart sounds.  Pulmonary:     Effort: Pulmonary effort is normal.     Breath sounds: Normal breath sounds. No wheezing.  Musculoskeletal:     Cervical back: Neck supple.  Lymphadenopathy:     Cervical: No  cervical adenopathy.  Skin:    General: Skin is warm and dry.  Neurological:     Mental Status: She is alert and oriented to person, place, and time.     BP 129/83   Pulse 81   Ht 5\' 7"  (1.702 m)   Wt 217 lb (98.4 kg)   SpO2 99%   BMI 33.99 kg/m  Wt Readings from Last 3 Encounters:  07/17/20 217 lb (98.4 kg)  07/10/20 218 lb (98.9 kg)  07/08/20 215 lb (97.5 kg)     There are no preventive care reminders to display for this patient.  There are no preventive care reminders to display for this patient.  Lab Results  Component Value Date   TSH 3.457 07/10/2020   Lab Results  Component Value Date   WBC 11.8 (H) 07/10/2020   HGB 13.7 07/10/2020   HCT 41.2 07/10/2020   MCV 97.6 07/10/2020   PLT 376 07/10/2020   Lab Results  Component Value Date   NA 134 (L) 07/10/2020   K 4.0 07/10/2020   CO2 26 07/10/2020   GLUCOSE 89 07/10/2020   BUN 17 07/10/2020   CREATININE 0.84 07/10/2020   BILITOT 0.8 07/10/2020   ALKPHOS 58 07/10/2020   AST 21 07/10/2020   ALT 22 07/10/2020   PROT 6.9 07/10/2020   ALBUMIN 3.9 07/10/2020   CALCIUM 9.0 07/10/2020   ANIONGAP 10 07/10/2020   Lab Results  Component Value Date   CHOL 205 (H) 10/02/2019   Lab Results  Component Value Date   HDL 45 (L) 10/02/2019   Lab Results  Component Value Date   LDLCALC 126 (H) 10/02/2019   Lab Results  Component Value Date   TRIG 203 (H) 10/02/2019   Lab Results  Component Value Date   CHOLHDL 4.6 10/02/2019   No results found for: HGBA1C    Assessment &  Plan:   Problem List Items Addressed This Visit      Cardiovascular and Mediastinum   Hypertension    Blood pressure looks absolutely phenomenal today.  Continue you with twice daily dosing of her blood pressure pill for now it seems to be regulating things pretty well though it does have a terrible headache in it.  On encouraged her to continue to work on healthy diet and regular exercise as well as avoiding excess salt intake.  Try  to really keep good control over stress levels, avoiding decongestants and avoiding NSAIDs for now.  She has been using a little bit of NSAIDs here and there for headache etc.  Switch to Tylenol just to avoid anything that could be bumping her blood pressure intermittently I really think that Covid has affected her pressure we have been seeing this with several of our patients where it is causing some erratic changes in blood pressure levels.       Other Visit Diagnoses    Acute non-recurrent maxillary sinusitis    -  Primary   Relevant Medications   amoxicillin-clavulanate (AUGMENTIN) 875-125 MG tablet   Hyponatremia       Relevant Orders   BASIC METABOLIC PANEL WITH GFR   Leukocytosis, unspecified type       Relevant Orders   CBC      Acute sinusitis-we will treat with Augmentin.  Call if not better in 1 week recommend restarting her nasal saline.  Hyponatremia during ED visit we will recheck that today.  Leukocytosis during ED visit we will check that today as well to see if it is resolved though it could also be coming from her current sinus infection.  Meds ordered this encounter  Medications  . amoxicillin-clavulanate (AUGMENTIN) 875-125 MG tablet    Sig: Take 1 tablet by mouth 2 (two) times daily.    Dispense:  20 tablet    Refill:  0    Follow-up: No follow-ups on file.   I spent 30 minutes on the day of the encounter to include pre-visit record review, face-to-face time with the patient and post visit ordering of test.    Beatrice Lecher, MD

## 2020-07-17 NOTE — Assessment & Plan Note (Signed)
Blood pressure looks absolutely phenomenal today.  Continue you with twice daily dosing of her blood pressure pill for now it seems to be regulating things pretty well though it does have a terrible headache in it.  On encouraged her to continue to work on healthy diet and regular exercise as well as avoiding excess salt intake.  Try to really keep good control over stress levels, avoiding decongestants and avoiding NSAIDs for now.  She has been using a little bit of NSAIDs here and there for headache etc.  Switch to Tylenol just to avoid anything that could be bumping her blood pressure intermittently I really think that Covid has affected her pressure we have been seeing this with several of our patients where it is causing some erratic changes in blood pressure levels.

## 2020-07-18 LAB — BASIC METABOLIC PANEL WITH GFR
BUN: 15 mg/dL (ref 7–25)
CO2: 29 mmol/L (ref 20–32)
Calcium: 9.7 mg/dL (ref 8.6–10.4)
Chloride: 100 mmol/L (ref 98–110)
Creat: 0.74 mg/dL (ref 0.50–1.05)
GFR, Est African American: 109 mL/min/{1.73_m2} (ref >=60)
GFR, Est Non African American: 94 mL/min/{1.73_m2} (ref >=60)
Glucose, Bld: 129 mg/dL — ABNORMAL HIGH (ref 65–99)
Potassium: 3.7 mmol/L (ref 3.5–5.3)
Sodium: 138 mmol/L (ref 135–146)

## 2020-07-18 LAB — CBC
HCT: 41.2 % (ref 35.0–45.0)
Hemoglobin: 13.5 g/dL (ref 11.7–15.5)
MCH: 31.4 pg (ref 27.0–33.0)
MCHC: 32.8 g/dL (ref 32.0–36.0)
MCV: 95.8 fL (ref 80.0–100.0)
MPV: 10.9 fL (ref 7.5–12.5)
Platelets: 272 10*3/uL (ref 140–400)
RBC: 4.3 10*6/uL (ref 3.80–5.10)
RDW: 12.5 % (ref 11.0–15.0)
WBC: 8.2 10*3/uL (ref 3.8–10.8)

## 2020-10-05 ENCOUNTER — Encounter (INDEPENDENT_AMBULATORY_CARE_PROVIDER_SITE_OTHER): Payer: Self-pay

## 2020-11-05 ENCOUNTER — Ambulatory Visit: Payer: BC Managed Care – PPO | Admitting: Sports Medicine

## 2020-11-05 ENCOUNTER — Other Ambulatory Visit: Payer: Self-pay

## 2020-11-05 ENCOUNTER — Ambulatory Visit (INDEPENDENT_AMBULATORY_CARE_PROVIDER_SITE_OTHER): Payer: BC Managed Care – PPO

## 2020-11-05 DIAGNOSIS — M79661 Pain in right lower leg: Secondary | ICD-10-CM

## 2020-11-05 NOTE — Assessment & Plan Note (Signed)
This is a pleasant 50 year old female, she took a misstep on some cobblestone about 2 weeks ago and had some pain in her right calf at the musculotendinous junction, negative Homans' sign, painful at the junction to palpation. Adding heel lifts, calf compressive dressing, DVT ultrasound, rehab exercises, return in 2 weeks.

## 2020-11-05 NOTE — Progress Notes (Signed)
    Procedures performed today:    None.  Independent interpretation of notes and tests performed by another provider:   None.  Brief History, Exam, Impression, and Recommendations:    Right calf pain This is a pleasant 50 year old female, she took a misstep on some cobblestone about 2 weeks ago and had some pain in her right calf at the musculotendinous junction, negative Homans' sign, painful at the junction to palpation. Adding heel lifts, calf compressive dressing, DVT ultrasound, rehab exercises, return in 2 weeks.    ___________________________________________ Gwen Her. Dianah Field, M.D., ABFM., CAQSM. Primary Care and [place] Instructor of [place] [facility]

## 2020-12-05 ENCOUNTER — Ambulatory Visit: Payer: BC Managed Care – PPO | Admitting: Family Medicine

## 2020-12-09 ENCOUNTER — Encounter: Payer: Self-pay | Admitting: Family Medicine

## 2020-12-09 ENCOUNTER — Ambulatory Visit: Payer: BC Managed Care – PPO | Admitting: Family Medicine

## 2020-12-09 ENCOUNTER — Other Ambulatory Visit: Payer: Self-pay

## 2020-12-09 VITALS — BP 129/65 | HR 72 | Ht 67.0 in | Wt 210.0 lb

## 2020-12-09 DIAGNOSIS — I10 Essential (primary) hypertension: Secondary | ICD-10-CM | POA: Diagnosis not present

## 2020-12-09 DIAGNOSIS — Z6832 Body mass index (BMI) 32.0-32.9, adult: Secondary | ICD-10-CM

## 2020-12-09 DIAGNOSIS — D72829 Elevated white blood cell count, unspecified: Secondary | ICD-10-CM | POA: Diagnosis not present

## 2020-12-09 DIAGNOSIS — F439 Reaction to severe stress, unspecified: Secondary | ICD-10-CM

## 2020-12-09 DIAGNOSIS — Z23 Encounter for immunization: Secondary | ICD-10-CM | POA: Diagnosis not present

## 2020-12-09 MED ORDER — ESCITALOPRAM OXALATE 10 MG PO TABS
10.0000 mg | ORAL_TABLET | Freq: Every day | ORAL | 1 refills | Status: DC
Start: 2020-12-09 — End: 2021-01-08

## 2020-12-09 NOTE — Assessment & Plan Note (Addendum)
PHQ 2 score of 2 today which is good.  Again I bumped her Lexapro to 10 mg temporarily.  New prescription sent to pharmacy if she decides she wants to handle it more long-term will increase to 90 tabs if not then will go back down to 5 mg for now.  Otherwise plan to follow back up in 6 months.  Did encourage her to reach out to therapist Janett Billow if she needs to.

## 2020-12-09 NOTE — Progress Notes (Addendum)
Established Patient Office Visit  Subjective:  Patient ID: Shannon May, female    DOB: 22-Nov-1970  Age: 50 y.o. MRN: [id number]  CC:  Chief Complaint  Patient presents with  . Hypertension    HPI Shannon May presents for 6 mo f/u   Hypertension- Pt denies chest pain, SOB, dizziness, or heart palpitations.  Taking meds as directed w/o problems.  Denies medication side effects.  No longer having any real low blood pressures under 100 she is mostly been staying 120s at home.  Has been splitting the tab in half and taking half a tab in the morning and half a tab in the evening and that seems to be working really well.  Follow-up mood-she has been on Lexapro 5 mg for quite some time.  She had previously tried 10 mg but did not feel great on it.  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  She had been having some stress type dreams on the 5 mg a day actually have resolved since she went up on the dose.  She has been tolerating it well so far.  She says the last couple days she is woken up in the morning and then after getting up is started to have a little bit of chest discomfort but she is also noticed that she is belching a little bit more  He also started the [place] about 6 weeks ago and has already lost 17 pounds.  She has been really happy with it and says she feels good on it she does not feel hungry.  Past Medical History:  Diagnosis Date  . Anxiety   . Diverticulosis   . Hypertension 11/08/2017  . Malignant neoplasm of upper-outer quadrant of left breast in female, estrogen receptor positive ([place]) 06/08/2018  . Sinusitis 11/15/2017    Past Surgical History:  Procedure Laterality Date  . BREAST RECONSTRUCTION WITH PLACEMENT OF TISSUE EXPANDER AND ALLODERM N/A 08/14/2018   Procedure: BREAST RECONSTRUCTION WITH PLACEMENT OF TISSUE EXPANDER AND FLEXHD;   Surgeon: Wallace Going, DO;  Location: [place];  Service: Plastics;  Laterality: N/A;  . BREAST SURGERY    . DILATION AND CURETTAGE OF UTERUS    . LASIK    . LIPOSUCTION Bilateral 11/15/2018   Procedure: LIPOSUCTION;  Surgeon: Wallace Going, DO;  Location: [place];  Service: Plastics;  Laterality: Bilateral;  . LIPOSUCTION WITH LIPOFILLING Bilateral 02/22/2019   Procedure: LIPOSUCTION WITH LIPOFILLING;  Surgeon: Wallace Going, DO;  Location: [place];  Service: Plastics;  Laterality: Bilateral;  2 hours, please  . MASTECTOMY W/ SENTINEL NODE BIOPSY Bilateral 08/14/2018   with breast reconstruction  . MASTECTOMY W/ SENTINEL NODE BIOPSY Bilateral 08/14/2018   Procedure: BILATERAL MASTECTOMIES  WITH LEFT SENTINEL LYMPH NODE MAPPING;  Surgeon: Jovita Kussmaul, MD;  Location: [place];  Service: General;  Laterality: Bilateral;  . REMOVAL OF BILATERAL TISSUE EXPANDERS WITH PLACEMENT OF BILATERAL BREAST IMPLANTS Bilateral 11/15/2018   Procedure: removal of bilateral expanders and placement of bilateral silicone implants.;  Surgeon: Wallace Going, DO;  Location: [place];  Service: Plastics;  Laterality: Bilateral;    Family History  Problem Relation Age of Onset  . Colon cancer Maternal Grandfather   . Melanoma Paternal Grandfather   . Heart attack Maternal Grandmother   .  Hypertension Mother   . Hyperlipidemia Father   . Vaginal cancer Maternal Aunt     Social History   Socioeconomic History  . Marital status: Married    Spouse name: Ronalee Belts  . Number of children: 3  . Years of education: Masters  . Highest education level: Not on file  Occupational History  . Occupation: Education officer, museum    Comment: [employer]    Employer: [employer]  Tobacco Use  . Smoking status: Former Smoker    Types: Cigarettes    Quit date: 06/13/1988    Years since quitting: 32.5  . Smokeless tobacco: Never Used  . Tobacco  comment: She smoked a couple of months in her teens.   Vaping Use  . Vaping Use: Never used  Substance and Sexual Activity  . Alcohol use: Yes    Alcohol/week: 3.0 standard drinks    Types: 3 Glasses of wine per week  . Drug use: No  . Sexual activity: Yes    Partners: Male  Other Topics Concern  . Not on file  Social History Narrative   Treadmill 5 days per week 2 caffeine drinks per day.    Social Determinants of Health   Financial Resource Strain: Not on file  Food Insecurity: Not on file  Transportation Needs: Not on file  Physical Activity: Not on file  Stress: Not on file  Social Connections: Not on file  Intimate Partner Violence: Not on file    Outpatient Medications Prior to Visit  Medication Sig Dispense Refill  . acetaminophen (TYLENOL) 500 MG tablet Take 1,000 mg by mouth every 6 (six) hours as needed for moderate pain.    . Ascorbic Acid (VITAMIN C PO) Take 1 tablet by mouth daily.     . cholecalciferol (VITAMIN D3) 25 MCG (1000 UNIT) tablet Take 1,000 Units by mouth daily.    Mariane Baumgarten Calcium (STOOL SOFTENER PO) Take 1 tablet by mouth 2 (two) times daily.     . fluticasone (FLONASE) 50 MCG/ACT nasal spray PLACE 2 SPRAYS INTO THE NOSE DAILY (Patient taking differently: Place 2 sprays into both nostrils daily.) 16 g 1  . ibuprofen (ADVIL) 200 MG tablet Take 200 mg by mouth every 6 (six) hours as needed for moderate pain.    Marland Kitchen loratadine (CLARITIN) 10 MG tablet Take 10 mg by mouth daily.    Marland Kitchen losartan-hydrochlorothiazide (HYZAAR) 50-12.5 MG tablet Take 1 tablet by mouth daily. 90 tablet 3  . Multiple Vitamins-Minerals (MULTIVITAMIN WOMEN PO) Take 1 capsule by mouth daily.    . Probiotic Product (PRO-BIOTIC BLEND PO) Take 1 tablet by mouth daily.     Marland Kitchen zinc gluconate 50 MG tablet Take 50 mg by mouth daily.    Marland Kitchen escitalopram (LEXAPRO) 5 MG tablet Take 1 tablet (5 mg total) by mouth daily. 90 tablet 1   No facility-administered medications prior to visit.     Allergies  Allergen Reactions  . Antihistamines, Chlorpheniramine-Type Shortness Of Breath    Makes heart race  . Sudafed [Pseudoephedrine Hcl]     shaking  . Sulfamethoxazole Nausea And Vomiting  . Erythromycin Nausea Only    ROS Review of Systems    Objective:    Physical Exam Constitutional:      Appearance: She is well-developed.  HENT:     Head: Normocephalic and atraumatic.  Cardiovascular:     Rate and Rhythm: Normal rate and regular rhythm.     Heart sounds: Normal heart sounds.  Pulmonary:  Effort: Pulmonary effort is normal.     Breath sounds: Normal breath sounds.  Skin:    General: Skin is warm and dry.  Neurological:     Mental Status: She is alert and oriented to person, place, and time.  Psychiatric:        Behavior: Behavior normal.     BP 129/65   Pulse 72   Ht 5\' 7"  (1.702 m)   Wt 210 lb (95.3 kg)   SpO2 100%   BMI 32.89 kg/m  Wt Readings from Last 3 Encounters:  12/09/20 210 lb (95.3 kg)  07/17/20 217 lb (98.4 kg)  07/10/20 218 lb (98.9 kg)     There are no preventive care reminders to display for this patient.  There are no preventive care reminders to display for this patient.  Lab Results  Component Value Date   TSH 3.457 07/10/2020   Lab Results  Component Value Date   WBC 8.2 07/17/2020   HGB 13.5 07/17/2020   HCT 41.2 07/17/2020   MCV 95.8 07/17/2020   PLT 272 07/17/2020   Lab Results  Component Value Date   NA 138 07/17/2020   K 3.7 07/17/2020   CO2 29 07/17/2020   GLUCOSE 129 (H) 07/17/2020   BUN 15 07/17/2020   CREATININE 0.74 07/17/2020   BILITOT 0.8 07/10/2020   ALKPHOS 58 07/10/2020   AST 21 07/10/2020   ALT 22 07/10/2020   PROT 6.9 07/10/2020   ALBUMIN 3.9 07/10/2020   CALCIUM 9.7 07/17/2020   ANIONGAP 10 07/10/2020   Lab Results  Component Value Date   CHOL 205 (H) 10/02/2019   Lab Results  Component Value Date   HDL 45 (L) 10/02/2019   Lab Results  Component Value Date   LDLCALC 126  (H) 10/02/2019   Lab Results  Component Value Date   TRIG 203 (H) 10/02/2019   Lab Results  Component Value Date   CHOLHDL 4.6 10/02/2019   No results found for: HGBA1C    Assessment & Plan:   Problem List Items Addressed This Visit      Cardiovascular and Mediastinum   Hypertension    Well controlled. Continue current regimen. Follow up in 6 mo.         Relevant Orders   CBC   COMPLETE METABOLIC PANEL WITH GFR   Lipid panel     Other   Stress    PHQ 2 score of 2 today which is good.  Again I bumped her Lexapro to 10 mg temporarily.  New prescription sent to pharmacy if she decides she wants to handle it more long-term will increase to 90 tabs if not then will go back down to 5 mg for now.  Otherwise plan to follow back up in 6 months.  Did encourage her to reach out to therapist Janett Billow if she needs to.       Other Visit Diagnoses    Need for Zostavax administration    -  Primary   Relevant Orders   Varicella-zoster vaccine IM (Shingrix) (Completed)   Leukocytosis, unspecified type       Relevant Orders   CBC   BMI 32.0-32.9,adult         BMI 32 - doing well on the [place].  Great job!!!!   Meds ordered this encounter  Medications  . escitalopram (LEXAPRO) 10 MG tablet    Sig: Take 1 tablet (10 mg total) by mouth daily.    Dispense:  30 tablet  Refill:  1    Follow-up: Return in about 6 months (around 06/10/2021) for mood and BP.    Beatrice Lecher, MD

## 2020-12-09 NOTE — Progress Notes (Signed)
She reports that she doubled up on her lexapro on Wednesday. 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

## 2020-12-09 NOTE — Assessment & Plan Note (Signed)
Well controlled. Continue current regimen. Follow up in  6 mo  

## 2020-12-13 ENCOUNTER — Other Ambulatory Visit: Payer: Self-pay | Admitting: Family Medicine

## 2020-12-17 LAB — COMPLETE METABOLIC PANEL WITH GFR
AG Ratio: 1.9 (calc) (ref 1.0–2.5)
ALT: 22 U/L (ref 6–29)
AST: 22 U/L (ref 10–35)
Albumin: 4.5 g/dL (ref 3.6–5.1)
Alkaline phosphatase (APISO): 63 U/L (ref 37–153)
BUN: 17 mg/dL (ref 7–25)
CO2: 27 mmol/L (ref 20–32)
Calcium: 9.5 mg/dL (ref 8.6–10.4)
Chloride: 102 mmol/L (ref 98–110)
Creat: 0.67 mg/dL (ref 0.50–1.05)
GFR, Est African American: 118 mL/min/{1.73_m2} (ref >=60)
GFR, Est Non African American: 102 mL/min/{1.73_m2} (ref >=60)
Globulin: 2.4 g/dL (calc) (ref 1.9–3.7)
Glucose, Bld: 93 mg/dL (ref 65–99)
Potassium: 4 mmol/L (ref 3.5–5.3)
Sodium: 138 mmol/L (ref 135–146)
Total Bilirubin: 0.5 mg/dL (ref 0.2–1.2)
Total Protein: 6.9 g/dL (ref 6.1–8.1)

## 2020-12-17 LAB — CBC
HCT: 42.1 % (ref 35.0–45.0)
Hemoglobin: 13.7 g/dL (ref 11.7–15.5)
MCH: 31.2 pg (ref 27.0–33.0)
MCHC: 32.5 g/dL (ref 32.0–36.0)
MCV: 95.9 fL (ref 80.0–100.0)
MPV: 11.3 fL (ref 7.5–12.5)
Platelets: 294 10*3/uL (ref 140–400)
RBC: 4.39 10*6/uL (ref 3.80–5.10)
RDW: 11.9 % (ref 11.0–15.0)
WBC: 5.9 10*3/uL (ref 3.8–10.8)

## 2020-12-17 LAB — LIPID PANEL
Cholesterol: 197 mg/dL (ref <200)
HDL: 44 mg/dL — ABNORMAL LOW (ref >=50)
LDL Cholesterol (Calc): 131 mg/dL (calc) — ABNORMAL HIGH
Non-HDL Cholesterol (Calc): 153 mg/dL (calc) — ABNORMAL HIGH (ref <130)
Total CHOL/HDL Ratio: 4.5 (calc) (ref <5.0)
Triglycerides: 111 mg/dL (ref <150)

## 2021-01-08 ENCOUNTER — Other Ambulatory Visit: Payer: Self-pay | Admitting: Family Medicine

## 2021-01-16 ENCOUNTER — Encounter (HOSPITAL_BASED_OUTPATIENT_CLINIC_OR_DEPARTMENT_OTHER): Payer: Self-pay | Admitting: Nurse Practitioner

## 2021-01-16 ENCOUNTER — Other Ambulatory Visit: Payer: Self-pay

## 2021-01-16 ENCOUNTER — Telehealth (INDEPENDENT_AMBULATORY_CARE_PROVIDER_SITE_OTHER): Payer: BC Managed Care – PPO | Admitting: Nurse Practitioner

## 2021-01-16 ENCOUNTER — Telehealth: Payer: Self-pay | Admitting: *Deleted

## 2021-01-16 VITALS — Wt 199.0 lb

## 2021-01-16 DIAGNOSIS — U071 COVID-19: Secondary | ICD-10-CM

## 2021-01-16 MED ORDER — MOLNUPIRAVIR EUA 200MG CAPSULE: 4.0000 | ORAL_CAPSULE | Freq: Two times a day (BID) | ORAL | 0 refills | Status: AC

## 2021-01-16 MED ORDER — ONDANSETRON HCL 8 MG PO TABS: 8.0000 mg | ORAL_TABLET | Freq: Three times a day (TID) | ORAL | 2 refills | Status: DC | PRN

## 2021-01-16 NOTE — Telephone Encounter (Signed)
Pt to be scheduled as a virtual w/[employer]

## 2021-01-16 NOTE — Progress Notes (Signed)
Virtual Visit via Telephone Note  I connected with  [name] on 01/16/21 at  3:10 PM EDT by telephone and verified that I am speaking with the correct person using two identifiers.   I discussed the limitations, risks, security and privacy concerns of performing an evaluation and management service by telephone and the availability of in person appointments. I also discussed with the patient that there may be a patient responsible charge related to this service. The patient expressed understanding and agreed to proceed.  Participating parties included in this telephone visit include: The patient and the nurse practitioner listed.  The patient is: In her car I am: In the office  Subjective:    CC: COVID  HPI: Shannon May is a 50 y.o. year old female presenting today via telephone visit to discuss new diagnosis of COVID-19. Her parents tested positive for COVID this morning and notified her. [facility] and was positive this afternoon. She had been out of town with her parents and her son all week in [place]. Son has no symptoms.   Sx onset was Monday with allergy-like symptoms. Tested negative for COVID.  Endorses rhinorrhea, cough, and clear mucous production.  She started a prednisone taper on Monday when symptoms started thinking it was allergy- finished the taper today.  No fevers, chills, body aches, shortness of breath, or dizziness.   Hx of breast cancer with bilateral mastectomy. Had COVID 07/2020 with monoclonal antibody treatment.  Vaccinated and boosted.   Past medical history, Surgical history, Family history not pertinant except as noted below, Social history, Allergies, and medications have been entered into the medical record, reviewed, and corrections made.   Review of Systems:  All review of systems negative except what is listed in the HPI  Objective:    General:  Patient speaking clearly in complete sentences. No shortness of  breath noted.   Alert and oriented x3.   Normal judgment.  No apparent acute distress.  Impression and Recommendations:    1. COVID-19 Positive [facility] with onset of symptoms on Monday.  Start molnupiravir today and continue until full cycle completed- see AVS for link to medication handout. Monitor for worsening symptoms, if shortness of breath or trouble breathing develop, seek emergency medical care. Zofran provided for intermittent nausea that may accompany molnupiravir use. Rest, drink plenty of fluids, and continue to quarantine.  If family develops symptoms, please notify the office so that they can be started on medication if advised.   - molnupiravir EUA 200 mg CAPS; Take 4 capsules (800 mg total) by mouth 2 (two) times daily for 5 days.  Dispense: 40 capsule; Refill: 0 - ondansetron (ZOFRAN) 8 MG tablet; Take 1 tablet (8 mg total) by mouth every 8 (eight) hours as needed for nausea or vomiting. Take 1 tab (8mg ) every 8 hours as needed for nausea.  Dispense: 30 tablet; Refill: 2    I discussed the assessment and treatment plan with the patient. The patient was provided an opportunity to ask questions and all were answered. The patient agreed with the plan and demonstrated an understanding of the instructions.   The patient was advised to call back or seek an in-person evaluation if the symptoms worsen or if the condition fails to improve as anticipated.  I provided 18 minutes of non-face-to-face time during this TELEPHONE encounter.    Orma Render, NP

## 2021-01-16 NOTE — Progress Notes (Signed)
Coughing on and off-productive clear phlegm, runny nose and blowing nose, no fever, no body aches, no chills.  Allergies have been very bad.  Been on prednisone treatment from Monday through today.

## 2021-01-16 NOTE — Patient Instructions (Signed)
I have sent in the molnupiravir to the pharmacy for you. Please start this as soon as you get it.   You will take 4 tablets (800mg ) twice a day for 5 days. You may take with or without food. Please be sure that you quarantine and monitor for worsening symptoms.  If you develop shortness of breath or any trouble breathing please seek emergency care immediately.  Monitor family members for symptoms and notify their provider if these develop.   [email or web address]

## 2021-01-21 ENCOUNTER — Encounter: Payer: Self-pay | Admitting: Family Medicine

## 2021-02-06 ENCOUNTER — Encounter: Payer: Self-pay | Admitting: *Deleted

## 2021-02-06 NOTE — Telephone Encounter (Signed)
OK for letter. Just cut and past form her note. [facility] [place] on Monday

## 2021-02-09 ENCOUNTER — Encounter: Payer: Self-pay | Admitting: Family Medicine

## 2021-03-23 LAB — HM PAP SMEAR: HM Pap smear: NEGATIVE

## 2021-03-24 ENCOUNTER — Encounter: Payer: Self-pay | Admitting: *Deleted

## 2021-04-23 ENCOUNTER — Encounter: Payer: Self-pay | Admitting: Family Medicine

## 2021-05-27 ENCOUNTER — Emergency Department
Admission: EM | Admit: 2021-05-27 | Discharge: 2021-05-27 | Disposition: A | Discharge status: Home / Self Care | Payer: BC Managed Care – PPO | Source: Home / Self Care

## 2021-05-27 ENCOUNTER — Other Ambulatory Visit: Payer: Self-pay

## 2021-05-27 DIAGNOSIS — R300 Dysuria: Secondary | ICD-10-CM

## 2021-05-27 DIAGNOSIS — N3001 Acute cystitis with hematuria: Secondary | ICD-10-CM

## 2021-05-27 LAB — POCT URINALYSIS DIP (MANUAL ENTRY)
Bilirubin, UA: NEGATIVE
Glucose, UA: NEGATIVE mg/dL
Ketones, POC UA: NEGATIVE mg/dL
Nitrite, UA: POSITIVE — AB
Protein Ur, POC: NEGATIVE mg/dL
Spec Grav, UA: 1.015 (ref 1.010–1.025)
Urobilinogen, UA: 0.2 E.U./dL
pH, UA: 7.5 (ref 5.0–8.0)

## 2021-05-27 MED ORDER — NITROFURANTOIN MONOHYD MACRO 100 MG PO CAPS: 100.0000 mg | ORAL_CAPSULE | Freq: Two times a day (BID) | ORAL | 0 refills | Status: DC

## 2021-05-27 NOTE — ED Triage Notes (Signed)
Pt presents to Urgent Care with c/o foul smell to urine 2 days ago and episodes of urinary hesitancy/retention since yesterday. Also c/o lower abdominal pressure.

## 2021-05-27 NOTE — Discharge Instructions (Addendum)
Drink plenty of water May take AZO if needed for burning Take the macrobid 2 x a day for 5 days Take 2 doses today Check My Chart for culture result

## 2021-05-27 NOTE — ED Provider Notes (Signed)
[name]    CSN: [id number] Arrival date & time: 05/27/21  0803      History   Chief Complaint Chief Complaint  Patient presents with   Urinary problems    HPI Shannon May is a 50 y.o. female.   HPI  Patient states she is had many urinary tract infections before, but none recently. She states that she currently has dysuria, frequency, and a strong odor to her urine.  She denies any fever or chills.  No nausea or vomiting.  No flank pain or abdominal pain.  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   Depression, major, single episode, mild ([place]) 06/06/2020   Stress 06/08/2019   S/P breast reconstruction, bilateral 12/22/2018   S/P mastectomy, bilateral 09/15/2018   Acquired absence of breast 08/22/2018   Breast cancer ([place]) 08/14/2018   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   Symptomatic mammary hypertrophy 06/08/2018   Malignant neoplasm of upper-outer quadrant of left breast in female, estrogen receptor positive ([place]) 06/08/2018   Nodule of ear canal, bilateral 11/15/2017   Hypertension 11/08/2017   Venous stasis 05/13/2016   Lumbar radiculitis 05/23/2014   Left subacromial bursitis 11/30/2012   DERMATITIS, 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/06/2010   HEADACHE 04/06/2010   HEEL PAIN, RIGHT 03/12/2009   ALLERGIC RHINITIS 05/09/2008    Past Surgical History:  Procedure Laterality Date   BREAST RECONSTRUCTION WITH PLACEMENT OF TISSUE EXPANDER AND ALLODERM N/A 08/14/2018   Procedure: BREAST RECONSTRUCTION WITH PLACEMENT OF TISSUE EXPANDER AND FLEXHD;  Surgeon: Wallace Going, DO;  Location: [facility];  Service: Plastics;  Laterality: N/A;    BREAST SURGERY     DILATION AND CURETTAGE OF UTERUS     LASIK     LIPOSUCTION Bilateral 11/15/2018   Procedure: LIPOSUCTION;  Surgeon: Wallace Going, DO;  Location: [facility];  Service: Plastics;  Laterality: Bilateral;   LIPOSUCTION WITH LIPOFILLING Bilateral 02/22/2019   Procedure: LIPOSUCTION WITH LIPOFILLING;  Surgeon: Wallace Going, DO;  Location: [facility];  Service: Plastics;  Laterality: Bilateral;  2 hours, please   MASTECTOMY W/ SENTINEL NODE BIOPSY Bilateral 08/14/2018   with breast reconstruction   MASTECTOMY W/ SENTINEL NODE BIOPSY Bilateral 08/14/2018   Procedure: BILATERAL MASTECTOMIES  WITH LEFT SENTINEL LYMPH NODE MAPPING;  Surgeon: Jovita Kussmaul, MD;  Location: [facility];  Service: General;  Laterality: Bilateral;   REMOVAL OF BILATERAL TISSUE EXPANDERS WITH PLACEMENT OF BILATERAL BREAST IMPLANTS Bilateral 11/15/2018   Procedure: removal of bilateral expanders and placement of bilateral silicone implants.;  Surgeon: Wallace Going, DO;  Location: [facility];  Service: Plastics;  Laterality: Bilateral;    OB History     Gravida  5   Para  3   Term      Preterm      AB  2   Living  3      SAB  1   IAB  1   Ectopic      Multiple      Live Births               Home Medications  Prior to Admission medications   Medication Sig Start Date End Date Taking? Authorizing Provider  nitrofurantoin, macrocrystal-monohydrate, (MACROBID) 100 MG capsule Take 1 capsule (100 mg total) by mouth 2 (two) times daily. 05/27/21  Yes Raylene Everts, MD  acetaminophen (TYLENOL) 500 MG tablet Take 1,000 mg by mouth every 6 (six) hours as needed for moderate pain.    [provider]  Ascorbic Acid (VITAMIN C PO) Take 1 tablet by mouth daily.     [provider]  cholecalciferol (VITAMIN D3) 25 MCG (1000 UNIT) tablet Take 1,000 Units by mouth daily.    [provider]  Docusate  Calcium (STOOL SOFTENER PO) Take 1 tablet by mouth 2 (two) times daily.     [provider]  escitalopram (LEXAPRO) 10 MG tablet TAKE 1 TABLET BY MOUTH EVERY DAY 01/08/21   Hali Marry, MD  fluticasone (FLONASE) 50 MCG/ACT nasal spray PLACE 2 SPRAYS INTO THE NOSE DAILY Patient taking differently: Place 2 sprays into both nostrils daily. 04/24/14   Breeback, Jade L, PA-C  ibuprofen (ADVIL) 200 MG tablet Take 200 mg by mouth every 6 (six) hours as needed for moderate pain.    [provider]  loratadine (CLARITIN) 10 MG tablet Take 10 mg by mouth daily.    [provider]  losartan-hydrochlorothiazide (HYZAAR) 50-12.5 MG tablet TAKE 1 TABLET BY MOUTH EVERY DAY 12/15/20   Hali Marry, MD  Multiple Vitamins-Minerals (MULTIVITAMIN WOMEN PO) Take 1 capsule by mouth daily.    [provider]  Probiotic Product (PRO-BIOTIC BLEND PO) Take 1 tablet by mouth daily.     [provider]  zinc gluconate 50 MG tablet Take 50 mg by mouth daily.    [provider]    Family History Family History  Problem Relation Age of Onset   Hypertension Mother    Hyperlipidemia Father    Heart attack Maternal Grandmother    Colon cancer Maternal Grandfather    Melanoma Paternal Grandfather    Vaginal cancer Maternal Aunt     Social History Social History   Tobacco Use   Smoking status: Former    Types: Cigarettes    Quit date: 06/13/1988    Years since quitting: 32.9   Smokeless tobacco: Never   Tobacco comments:    She smoked a couple of months in her teens.   Vaping Use   Vaping Use: Never used  Substance Use Topics   Alcohol use: Yes    Alcohol/week: 3.0 standard drinks    Types: 3 Glasses of wine per week    Comment: weekly   Drug use: No     Allergies   Antihistamines, chlorpheniramine-type; Sudafed [pseudoephedrine hcl]; Sulfamethoxazole; and Erythromycin   Review of Systems Review of Systems See HPI  Physical  Exam Triage Vital Signs ED Triage Vitals  Enc Vitals Group     BP 05/27/21 0819 104/69     Pulse Rate 05/27/21 0819 66     Resp 05/27/21 0819 20     Temp 05/27/21 0819 97.6 F (36.4 C)     Temp Source 05/27/21 0819 Oral     SpO2 05/27/21 0819 98 %     Weight 05/27/21 0815 215 lb (97.5 kg)     Height 05/27/21 0815 5' 7.5" (1.715 m)     Head Circumference --      Peak Flow --      Pain Score 05/27/21 0815 0     Pain Loc --  Pain Edu? --      Excl. in [place]? --    No data found.  Updated Vital Signs BP 104/69   Pulse 66   Temp 97.6 F (36.4 C) (Oral)   Resp 20   Ht 5' 7.5" (1.715 m)   Wt 97.5 kg   SpO2 98%   BMI 33.18 kg/m      Physical Exam Constitutional:      General: She is not in acute distress.    Appearance: She is well-developed.  HENT:     Head: Normocephalic and atraumatic.  Eyes:     Conjunctiva/sclera: Conjunctivae normal.     Pupils: Pupils are equal, round, and reactive to light.  Cardiovascular:     Rate and Rhythm: Normal rate.  Pulmonary:     Effort: Pulmonary effort is normal. No respiratory distress.  Abdominal:     General: There is no distension.     Palpations: Abdomen is soft.  Musculoskeletal:        General: Normal range of motion.     Cervical back: Normal range of motion.  Skin:    General: Skin is warm and dry.  Neurological:     Mental Status: She is alert.  Psychiatric:        Mood and Affect: Mood normal.        Behavior: Behavior normal.     UC Treatments / Results  Labs (all labs ordered are listed, but only abnormal results are displayed) Labs Reviewed  POCT URINALYSIS DIP (MANUAL ENTRY) - Abnormal; Notable for the following components:      Result Value   Blood, UA trace-intact (*)    Nitrite, UA Positive (*)    Leukocytes, UA Trace (*)    All other components within normal limits  URINE CULTURE    EKG   Radiology No results found.  Procedures Procedures (including critical care time)  Medications  Ordered in UC Medications - No data to display  Initial Impression / Assessment and Plan / UC Course  I have reviewed the triage vital signs and the nursing notes.  Pertinent labs & imaging results that were available during my care of the patient were reviewed by me and considered in my medical decision making (see chart for details).     Urine testing is consistent with a cystitis.  We will treat with Macrobid.  Push fluids.  Azo if needed.  Urine culture was performed to confirm diagnostic choice Final Clinical Impressions(s) / UC Diagnoses   Final diagnoses:  Dysuria  Acute cystitis with hematuria     Discharge Instructions      Drink plenty of water May take AZO if needed for burning Take the macrobid 2 x a day for 5 days Take 2 doses today Check My Chart for culture result   ED Prescriptions     Medication Sig Dispense Auth. Provider   nitrofurantoin, macrocrystal-monohydrate, (MACROBID) 100 MG capsule Take 1 capsule (100 mg total) by mouth 2 (two) times daily. 10 capsule Raylene Everts, MD      PDMP not reviewed this encounter.   Raylene Everts, MD 05/27/21 [phone]

## 2021-05-29 LAB — URINE CULTURE
MICRO NUMBER:: 12403912
SPECIMEN QUALITY:: ADEQUATE

## 2021-06-09 ENCOUNTER — Ambulatory Visit: Payer: BC Managed Care – PPO | Admitting: Family Medicine

## 2021-07-07 ENCOUNTER — Ambulatory Visit: Payer: BC Managed Care – PPO | Admitting: Plastic Surgery

## 2021-07-07 ENCOUNTER — Other Ambulatory Visit: Payer: Self-pay

## 2021-07-07 ENCOUNTER — Encounter: Payer: Self-pay | Admitting: Plastic Surgery

## 2021-07-07 DIAGNOSIS — Z9013 Acquired absence of bilateral breasts and nipples: Secondary | ICD-10-CM | POA: Diagnosis not present

## 2021-07-07 DIAGNOSIS — D0512 Intraductal carcinoma in situ of left breast: Secondary | ICD-10-CM | POA: Diagnosis not present

## 2021-07-07 NOTE — Progress Notes (Signed)
Patient ID: Shannon May, female    DOB: 20-Mar-1971, 50 y.o.   MRN: [id number]   Chief Complaint  Patient presents with   Follow-up    The patient is a 50 year old female here for her yearly visit.  She had breast cancer and underwent bilateral mastectomies with implant-based reconstruction.  Her surgery began in 08/2018.  She had her exchange surgery to implants in March 2020.  She has [place] smooth round high-profile 590 cc gel implants on both sides.  She had fat grafting in June 2020.  She has done amazingly well and has also had her nipple areola tattooing.  She is very happy with her results and has a very natural look.  She said they have gotten a slight bit lower but it does not bother her.  There are no lumps or bumps or areas of concern.  She was discharged from oncology.  She is planning on seeing Dr. Marlou Starks by the end of the year.   Review of Systems  Constitutional: Negative.   Eyes: Negative.   Respiratory: Negative.  Negative for chest tightness.   Cardiovascular: Negative.  Negative for leg swelling.  Gastrointestinal: Negative.   Endocrine: Negative.   Genitourinary: Negative.   Musculoskeletal: Negative.   Skin: Negative.  Negative for color change and wound.  Neurological: Negative.   Hematological: Negative.   Psychiatric/Behavioral: Negative.     Past Medical History:  Diagnosis Date   Anxiety    Diverticulosis    Hypertension 11/08/2017   Malignant neoplasm of upper-outer quadrant of left breast in female, estrogen receptor positive ([place]) 06/08/2018   Sinusitis 11/15/2017    Past Surgical History:  Procedure Laterality Date   BREAST RECONSTRUCTION WITH PLACEMENT OF TISSUE EXPANDER AND ALLODERM N/A 08/14/2018   Procedure: BREAST RECONSTRUCTION WITH PLACEMENT OF TISSUE EXPANDER AND FLEXHD;  Surgeon: Wallace Going, DO;  Location: [facility];  Service: Plastics;  Laterality: N/A;   BREAST SURGERY     DILATION AND CURETTAGE OF UTERUS     LASIK      LIPOSUCTION Bilateral 11/15/2018   Procedure: LIPOSUCTION;  Surgeon: Wallace Going, DO;  Location: [facility];  Service: Plastics;  Laterality: Bilateral;   LIPOSUCTION WITH LIPOFILLING Bilateral 02/22/2019   Procedure: LIPOSUCTION WITH LIPOFILLING;  Surgeon: Wallace Going, DO;  Location: [facility];  Service: Plastics;  Laterality: Bilateral;  2 hours, please   MASTECTOMY W/ SENTINEL NODE BIOPSY Bilateral 08/14/2018   with breast reconstruction   MASTECTOMY W/ SENTINEL NODE BIOPSY Bilateral 08/14/2018   Procedure: BILATERAL MASTECTOMIES  WITH LEFT SENTINEL LYMPH NODE MAPPING;  Surgeon: Jovita Kussmaul, MD;  Location: [facility];  Service: General;  Laterality: Bilateral;   REMOVAL OF BILATERAL TISSUE EXPANDERS WITH PLACEMENT OF BILATERAL BREAST IMPLANTS Bilateral 11/15/2018   Procedure: removal of bilateral expanders and placement of bilateral silicone implants.;  Surgeon: Wallace Going, DO;  Location: [facility];  Service: Plastics;  Laterality: Bilateral;      Current Outpatient Medications:    acetaminophen (TYLENOL) 500 MG tablet, Take 1,000 mg by mouth every 6 (six) hours as needed for moderate pain., Disp: , Rfl:    Ascorbic Acid (VITAMIN C PO), Take 1 tablet by mouth daily. , Disp: , Rfl:    cholecalciferol (VITAMIN D3) 25 MCG (1000 UNIT) tablet, Take 1,000 Units by mouth daily., Disp: , Rfl:    Docusate Calcium (STOOL SOFTENER PO), Take 1 tablet by mouth 2 (two) times daily. ,  Disp: , Rfl:    escitalopram (LEXAPRO) 10 MG tablet, TAKE 1 TABLET BY MOUTH EVERY DAY, Disp: 90 tablet, Rfl: 1   fluticasone (FLONASE) 50 MCG/ACT nasal spray, PLACE 2 SPRAYS INTO THE NOSE DAILY (Patient taking differently: Place 2 sprays into both nostrils daily.), Disp: 16 g, Rfl: 1   ibuprofen (ADVIL) 200 MG tablet, Take 200 mg by mouth every 6 (six) hours as needed for moderate pain., Disp: , Rfl:    loratadine (CLARITIN) 10 MG tablet, Take 10 mg by mouth  daily., Disp: , Rfl:    losartan-hydrochlorothiazide (HYZAAR) 50-12.5 MG tablet, TAKE 1 TABLET BY MOUTH EVERY DAY, Disp: 90 tablet, Rfl: 3   Multiple Vitamins-Minerals (MULTIVITAMIN WOMEN PO), Take 1 capsule by mouth daily., Disp: , Rfl:    nitrofurantoin, macrocrystal-monohydrate, (MACROBID) 100 MG capsule, Take 1 capsule (100 mg total) by mouth 2 (two) times daily., Disp: 10 capsule, Rfl: 0   Probiotic Product (PRO-BIOTIC BLEND PO), Take 1 tablet by mouth daily. , Disp: , Rfl:    zinc gluconate 50 MG tablet, Take 50 mg by mouth daily., Disp: , Rfl:    Objective:   There were no vitals filed for this visit.  Physical Exam Vitals and nursing note reviewed.  Constitutional:      Appearance: Normal appearance.  HENT:     Head: Normocephalic and atraumatic.  Cardiovascular:     Rate and Rhythm: Normal rate.     Pulses: Normal pulses.  Pulmonary:     Effort: Pulmonary effort is normal.  Abdominal:     General: There is no distension.     Palpations: Abdomen is soft.     Tenderness: There is no abdominal tenderness.  Musculoskeletal:        General: No swelling or deformity.  Skin:    Capillary Refill: Capillary refill takes less than 2 seconds.     Coloration: Skin is not jaundiced.     Findings: No bruising.  Neurological:     Mental Status: She is alert and oriented to person, place, and time.  Psychiatric:        Mood and Affect: Mood normal.        Behavior: Behavior normal.        Thought Content: Thought content normal.    Assessment & Plan:  Ductal carcinoma in situ (DCIS) of left breast  Acquired absence of both breasts  We will plan to see the patient back in 1 year.  We also talked about the process of implant evaluation.  She can have an ultrasound if there is any areas of concern or an ultrasound at 3 years for evaluation.  Right now she is fine and said that she might want to do the ultrasound next year which would be her fourth year.  We will plan to see her  back in 1 year.  Pictures were obtained of the patient and placed in the chart with the patient's or guardian's permission.   [place], DO

## 2021-07-11 ENCOUNTER — Other Ambulatory Visit: Payer: Self-pay | Admitting: Family Medicine

## 2021-07-13 ENCOUNTER — Telehealth: Payer: Self-pay

## 2021-07-13 NOTE — Telephone Encounter (Signed)
Patient has been scheduled for 08/27/21. AM

## 2021-07-13 NOTE — Telephone Encounter (Signed)
Please call pt to schedule appt.  No further refills until pt is seen.  T. Shilee Biggs, CMA  

## 2021-07-17 ENCOUNTER — Encounter: Payer: Self-pay | Admitting: Family Medicine

## 2021-07-17 ENCOUNTER — Other Ambulatory Visit: Payer: Self-pay

## 2021-07-17 ENCOUNTER — Ambulatory Visit: Payer: BC Managed Care – PPO | Admitting: Family Medicine

## 2021-07-17 VITALS — BP 103/70 | HR 86 | Temp 98.6°F | Wt 217.1 lb

## 2021-07-17 DIAGNOSIS — L570 Actinic keratosis: Secondary | ICD-10-CM

## 2021-07-17 DIAGNOSIS — M79675 Pain in left toe(s): Secondary | ICD-10-CM

## 2021-07-17 NOTE — Progress Notes (Signed)
Acute Office Visit  Subjective:    Patient ID: Shannon May, female    DOB: 1970/12/17, 50 y.o.   MRN: [id number]  Chief Complaint  Patient presents with   Toe Pain    HPI Patient is in today for painful lesion on right forth toe. States she was treated for a wart many years ago, but it returned. She ended up seeing a podiatrist for this in 2019 and it was suspected to be a keratotic lesion. Podiatrist was able to parr it with a chisel blade and patient reports it was improved for awhile. States the area flares up occasionally, but this is the worst it has been. States it.   L 4th toe lesion, painful if toes squish together, pain shoots up into ankle at times, this is the wrost it is has flared up   Past Medical History:  Diagnosis Date   Anxiety    Diverticulosis    Hypertension 11/08/2017   Malignant neoplasm of upper-outer quadrant of left breast in female, estrogen receptor positive ([place]) 06/08/2018   Sinusitis 11/15/2017    Past Surgical History:  Procedure Laterality Date   BREAST RECONSTRUCTION WITH PLACEMENT OF TISSUE EXPANDER AND ALLODERM N/A 08/14/2018   Procedure: BREAST RECONSTRUCTION WITH PLACEMENT OF TISSUE EXPANDER AND FLEXHD;  Surgeon: Wallace Going, DO;  Location: [facility];  Service: Plastics;  Laterality: N/A;   BREAST SURGERY     DILATION AND CURETTAGE OF UTERUS     LASIK     LIPOSUCTION Bilateral 11/15/2018   Procedure: LIPOSUCTION;  Surgeon: Wallace Going, DO;  Location: [facility];  Service: Plastics;  Laterality: Bilateral;   LIPOSUCTION WITH LIPOFILLING Bilateral 02/22/2019   Procedure: LIPOSUCTION WITH LIPOFILLING;  Surgeon: Wallace Going, DO;  Location: [facility];  Service: Plastics;  Laterality: Bilateral;  2 hours, please   MASTECTOMY W/ SENTINEL NODE BIOPSY Bilateral 08/14/2018   with breast reconstruction   MASTECTOMY W/ SENTINEL NODE BIOPSY Bilateral 08/14/2018   Procedure: BILATERAL MASTECTOMIES   WITH LEFT SENTINEL LYMPH NODE MAPPING;  Surgeon: Jovita Kussmaul, MD;  Location: [facility];  Service: General;  Laterality: Bilateral;   REMOVAL OF BILATERAL TISSUE EXPANDERS WITH PLACEMENT OF BILATERAL BREAST IMPLANTS Bilateral 11/15/2018   Procedure: removal of bilateral expanders and placement of bilateral silicone implants.;  Surgeon: Wallace Going, DO;  Location: [facility];  Service: Plastics;  Laterality: Bilateral;    Family History  Problem Relation Age of Onset   Hypertension Mother    Hyperlipidemia Father    Heart attack Maternal Grandmother    Colon cancer Maternal Grandfather    Melanoma Paternal Grandfather    Vaginal cancer Maternal Aunt     Social History   Socioeconomic History   Marital status: Married    Spouse name: Ronalee Belts   Number of children: 3   Years of education: Masters   [employer] education level: Not on file  Occupational History   Occupation: Education officer, museum    Comment: [place]    Employer: [employer]  Tobacco Use   Smoking status: Former    Types: Cigarettes    Quit date: 06/13/1988    Years since quitting: 33.1   Smokeless tobacco: Never   Tobacco comments:    She smoked a couple of months in her teens.   Vaping Use   Vaping Use: Never used  Substance and Sexual Activity   Alcohol use: Yes    Alcohol/week: 3.0 standard drinks  Types: 3 Glasses of wine per week    Comment: weekly   Drug use: No   Sexual activity: Not on file  Other Topics Concern   Not on file  Social History Narrative   Treadmill 5 days per week 2 caffeine drinks per day.    Social Determinants of Health   Financial Resource Strain: Not on file  Food Insecurity: Not on file  Transportation Needs: Not on file  Physical Activity: Not on file  Stress: Not on file  Social Connections: Not on file  Intimate Partner Violence: Not on file    Outpatient Medications Prior to Visit  Medication Sig Dispense Refill   acetaminophen (TYLENOL) 500  MG tablet Take 1,000 mg by mouth every 6 (six) hours as needed for moderate pain.     Ascorbic Acid (VITAMIN C PO) Take 1 tablet by mouth daily.      cholecalciferol (VITAMIN D3) 25 MCG (1000 UNIT) tablet Take 1,000 Units by mouth daily.     Docusate Calcium (STOOL SOFTENER PO) Take 1 tablet by mouth 2 (two) times daily.      escitalopram (LEXAPRO) 10 MG tablet Take 1 tablet (10 mg total) by mouth daily. OFFICE VISIT REQUIRED PRIOR TO ANY FURTHER REFILLS 30 tablet 0   fluticasone (FLONASE) 50 MCG/ACT nasal spray PLACE 2 SPRAYS INTO THE NOSE DAILY (Patient taking differently: Place 2 sprays into both nostrils daily.) 16 g 1   ibuprofen (ADVIL) 200 MG tablet Take 200 mg by mouth every 6 (six) hours as needed for moderate pain.     loratadine (CLARITIN) 10 MG tablet Take 10 mg by mouth daily.     losartan-hydrochlorothiazide (HYZAAR) 50-12.5 MG tablet TAKE 1 TABLET BY MOUTH EVERY DAY 90 tablet 3   Multiple Vitamins-Minerals (MULTIVITAMIN WOMEN PO) Take 1 capsule by mouth daily.     nitrofurantoin, macrocrystal-monohydrate, (MACROBID) 100 MG capsule Take 1 capsule (100 mg total) by mouth 2 (two) times daily. 10 capsule 0   Probiotic Product (PRO-BIOTIC BLEND PO) Take 1 tablet by mouth daily.      zinc gluconate 50 MG tablet Take 50 mg by mouth daily.     No facility-administered medications prior to visit.    Allergies  Allergen Reactions   Antihistamines, Chlorpheniramine-Type Shortness Of Breath    Makes heart race   Sudafed [Pseudoephedrine Hcl]     shaking   Sulfamethoxazole Nausea And Vomiting   Erythromycin Nausea Only    Review of Systems All review of systems negative except what is listed in the HPI     Objective:    Physical Exam Vitals reviewed.  Constitutional:      Appearance: Normal appearance.  Skin:    Findings: Lesion present. No rash.     Comments: Left 4th toe with keratotic lesion. See picture  Neurological:     Mental Status: She is alert and oriented to  person, place, and time.  Psychiatric:        Mood and Affect: Mood normal.        Behavior: Behavior normal.        Thought Content: Thought content normal.        Judgment: Judgment normal.        BP 103/70 (BP Location: Right Arm, Patient Position: Sitting, Cuff Size: Normal)   Pulse 86   Temp 98.6 F (37 C) (Oral)   Wt 217 lb 1.9 oz (98.5 kg)   SpO2 98%   BMI 33.50 kg/m  Wt Readings from Last  3 Encounters:  07/17/21 217 lb 1.9 oz (98.5 kg)  05/27/21 215 lb (97.5 kg)  01/16/21 199 lb (90.3 kg)    Health Maintenance Due  Topic Date Due   Hepatitis C Screening  Never done    There are no preventive care reminders to display for this patient.   Lab Results  Component Value Date   TSH 3.457 07/10/2020   Lab Results  Component Value Date   WBC 5.9 12/16/2020   HGB 13.7 12/16/2020   HCT 42.1 12/16/2020   MCV 95.9 12/16/2020   PLT 294 12/16/2020   Lab Results  Component Value Date   NA 138 12/16/2020   K 4.0 12/16/2020   CO2 27 12/16/2020   GLUCOSE 93 12/16/2020   BUN 17 12/16/2020   CREATININE 0.67 12/16/2020   BILITOT 0.5 12/16/2020   ALKPHOS 58 07/10/2020   AST 22 12/16/2020   ALT 22 12/16/2020   PROT 6.9 12/16/2020   ALBUMIN 3.9 07/10/2020   CALCIUM 9.5 12/16/2020   ANIONGAP 10 07/10/2020   Lab Results  Component Value Date   CHOL 197 12/16/2020   Lab Results  Component Value Date   HDL 44 (L) 12/16/2020   Lab Results  Component Value Date   LDLCALC 131 (H) 12/16/2020   Lab Results  Component Value Date   TRIG 111 12/16/2020   Lab Results  Component Value Date   CHOLHDL 4.5 12/16/2020   No results found for: HGBA1C     Assessment & Plan:   1. Toe pain, left 2. 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 Lesion debrided/parred with Dr. Madilyn Fireman in office today. Tolerated well. Recommend she wear a spacer between those toes,  keep clean, monitor for infection, use pumice stone as needed for re-thickening. Patient aware of signs/symptoms requiring further/urgent evaluation.  Follow-up as needed.   Purcell Nails Olevia Bowens, DNP, FNP-C

## 2021-07-17 NOTE — Patient Instructions (Signed)
Spacer between toes Pumice stone as needed for buildup

## 2021-08-02 ENCOUNTER — Other Ambulatory Visit: Payer: Self-pay

## 2021-08-02 ENCOUNTER — Emergency Department: Admit: 2021-08-02 | Payer: Self-pay

## 2021-08-02 ENCOUNTER — Emergency Department
Admission: EM | Admit: 2021-08-02 | Discharge: 2021-08-02 | Disposition: A | Discharge status: Home / Self Care | Payer: BC Managed Care – PPO | Source: Home / Self Care | Attending: Family Medicine | Admitting: Family Medicine

## 2021-08-02 DIAGNOSIS — N390 Urinary tract infection, site not specified: Secondary | ICD-10-CM

## 2021-08-02 LAB — POCT URINALYSIS DIP (MANUAL ENTRY)
Bilirubin, UA: NEGATIVE
Blood, UA: NEGATIVE
Glucose, UA: 100 mg/dL — AB
Nitrite, UA: POSITIVE — AB
Protein Ur, POC: 30 mg/dL — AB
Spec Grav, UA: 1.025 (ref 1.010–1.025)
Urobilinogen, UA: 1 E.U./dL
pH, UA: 6.5 (ref 5.0–8.0)

## 2021-08-02 MED ORDER — CEPHALEXIN 500 MG PO CAPS
500.0000 mg | ORAL_CAPSULE | Freq: Two times a day (BID) | ORAL | 0 refills | Status: DC
Start: 2021-08-02 — End: 2021-08-27

## 2021-08-02 NOTE — ED Triage Notes (Addendum)
Pt present lower abdomen pain with urinary frequency and burning sensation after urinating. Symptom started on Friday.  Pt did take azo to for some relief.

## 2021-08-02 NOTE — Discharge Instructions (Signed)
Drink lots of fluids Take cephalexin 2 times a day for 5 days May continue Azo if needed for urinary discomfort See your doctor if urinary tract infections become recurrent

## 2021-08-02 NOTE — ED Provider Notes (Signed)
[name]    CSN: [id number] Arrival date & time: 08/02/21  4163      History   Chief Complaint Chief Complaint  Patient presents with   Abdominal Pain    HPI Shannon May is a 50 y.o. female.   HPI  I just saw this patient 2 months ago for a bladder infection.  Treated successfully with Macrodantin.  Patient is back with recurring urinary tract infection.  She has dysuria frequency and burning.  No flank pain.  No vomiting.  No fever or chills.  No history of kidney problem  Past Medical History:  Diagnosis Date   Anxiety    Diverticulosis    Hypertension 11/08/2017   Malignant neoplasm of upper-outer quadrant of left breast in female, estrogen receptor positive ([place]) 06/08/2018   Sinusitis 11/15/2017    Patient Active Problem List   Diagnosis Date Noted   Right calf pain 11/05/2020   Post-COVID syndrome 07/03/2020   Depression, major, single episode, mild ([place]) 06/06/2020   Stress 06/08/2019   S/P breast reconstruction, bilateral 12/22/2018   S/P mastectomy, bilateral 09/15/2018   Acquired absence of breast 08/22/2018   Breast cancer ([place]) 08/14/2018   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   Symptomatic mammary hypertrophy 06/08/2018   Malignant neoplasm of upper-outer quadrant of left breast in female, estrogen receptor positive ([place]) 06/08/2018   Nodule of ear canal, bilateral 11/15/2017   Hypertension 11/08/2017   Venous stasis 05/13/2016   Lumbar radiculitis 05/23/2014   Left subacromial bursitis 11/30/2012   DERMATITIS, 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/06/2010   HEADACHE 04/06/2010   HEEL PAIN, RIGHT 03/12/2009   ALLERGIC RHINITIS 05/09/2008    Past Surgical History:  Procedure Laterality Date   BREAST RECONSTRUCTION WITH PLACEMENT OF TISSUE EXPANDER AND ALLODERM N/A 08/14/2018   Procedure: BREAST RECONSTRUCTION WITH PLACEMENT OF TISSUE EXPANDER AND FLEXHD;  Surgeon: Wallace Going, DO;  Location: [facility];  Service: Plastics;   Laterality: N/A;   BREAST SURGERY     DILATION AND CURETTAGE OF UTERUS     LASIK     LIPOSUCTION Bilateral 11/15/2018   Procedure: LIPOSUCTION;  Surgeon: Wallace Going, DO;  Location: [facility];  Service: Plastics;  Laterality: Bilateral;   LIPOSUCTION WITH LIPOFILLING Bilateral 02/22/2019   Procedure: LIPOSUCTION WITH LIPOFILLING;  Surgeon: Wallace Going, DO;  Location: [facility];  Service: Plastics;  Laterality: Bilateral;  2 hours, please   MASTECTOMY W/ SENTINEL NODE BIOPSY Bilateral 08/14/2018   with breast reconstruction   MASTECTOMY W/ SENTINEL NODE BIOPSY Bilateral 08/14/2018   Procedure: BILATERAL MASTECTOMIES  WITH LEFT SENTINEL LYMPH NODE MAPPING;  Surgeon: Jovita Kussmaul, MD;  Location: [facility];  Service: General;  Laterality: Bilateral;   REMOVAL OF BILATERAL TISSUE EXPANDERS WITH PLACEMENT OF BILATERAL BREAST IMPLANTS Bilateral 11/15/2018   Procedure: removal of bilateral expanders and placement of bilateral silicone implants.;  Surgeon: Wallace Going, DO;  Location: [facility];  Service: Plastics;  Laterality: Bilateral;    OB History     Gravida  5   Para  3   Term      Preterm      AB  2   Living  3      SAB  1   IAB  1   Ectopic      Multiple      Live Births               Home  Medications    Prior to Admission medications   Medication Sig Start Date End Date Taking? Authorizing Provider  cephALEXin (KEFLEX) 500 MG capsule Take 1 capsule (500 mg total) by mouth 2 (two) times daily. 08/02/21  Yes Raylene Everts, MD  acetaminophen (TYLENOL) 500 MG tablet Take 1,000 mg by mouth every 6 (six) hours as needed for moderate pain.    [provider]  Ascorbic Acid (VITAMIN C PO) Take 1 tablet by mouth daily.     [provider]  cholecalciferol (VITAMIN D3) 25 MCG (1000 UNIT) tablet Take 1,000 Units by mouth daily.    [provider]  Docusate Calcium (STOOL  SOFTENER PO) Take 1 tablet by mouth 2 (two) times daily.     [provider]  escitalopram (LEXAPRO) 10 MG tablet Take 1 tablet (10 mg total) by mouth daily. OFFICE VISIT REQUIRED PRIOR TO ANY FURTHER REFILLS 07/13/21   Hali Marry, MD  fluticasone (FLONASE) 50 MCG/ACT nasal spray PLACE 2 SPRAYS INTO THE NOSE DAILY Patient taking differently: Place 2 sprays into both nostrils daily. 04/24/14   Breeback, Jade L, PA-C  ibuprofen (ADVIL) 200 MG tablet Take 200 mg by mouth every 6 (six) hours as needed for moderate pain.    [provider]  loratadine (CLARITIN) 10 MG tablet Take 10 mg by mouth daily.    [provider]  losartan-hydrochlorothiazide (HYZAAR) 50-12.5 MG tablet TAKE 1 TABLET BY MOUTH EVERY DAY 12/15/20   Hali Marry, MD  Multiple Vitamins-Minerals (MULTIVITAMIN WOMEN PO) Take 1 capsule by mouth daily.    [provider]  Probiotic Product (PRO-BIOTIC BLEND PO) Take 1 tablet by mouth daily.     [provider]  zinc gluconate 50 MG tablet Take 50 mg by mouth daily.    [provider]    Family History Family History  Problem Relation Age of Onset   Hypertension Mother    Hyperlipidemia Father    Heart attack Maternal Grandmother    Colon cancer Maternal Grandfather    Melanoma Paternal Grandfather    Vaginal cancer Maternal Aunt     Social History Social History   Tobacco Use   Smoking status: Former    Types: Cigarettes    Quit date: 06/13/1988    Years since quitting: 33.1   Smokeless tobacco: Never   Tobacco comments:    She smoked a couple of months in her teens.   Vaping Use   Vaping Use: Never used  Substance Use Topics   Alcohol use: Yes    Alcohol/week: 3.0 standard drinks    Types: 3 Glasses of wine per week    Comment: weekly   Drug use: No     Allergies   Antihistamines, chlorpheniramine-type; Sudafed [pseudoephedrine hcl]; Sulfamethoxazole; and Erythromycin   Review of  Systems Review of Systems See HPI  Physical Exam Triage Vital Signs ED Triage Vitals  Enc Vitals Group     BP 08/02/21 0950 120/83     Pulse Rate 08/02/21 0950 68     Resp 08/02/21 0950 16     Temp 08/02/21 0950 98.2 F (36.8 C)     Temp Source 08/02/21 0950 Oral     SpO2 08/02/21 0950 99 %     Weight --      Height --      Head Circumference --      Peak Flow --      Pain Score 08/02/21 0948 2  Pain Loc --      Pain Edu? --      Excl. in [place]? --    No data found.  Updated Vital Signs BP 120/83 (BP Location: Right Arm)   Pulse 68   Temp 98.2 F (36.8 C) (Oral)   Resp 16   SpO2 99%      Physical Exam Constitutional:      General: She is not in acute distress.    Appearance: She is well-developed and normal weight.  HENT:     Head: Normocephalic and atraumatic.     Nose:     Comments: Mask is in place Eyes:     Conjunctiva/sclera: Conjunctivae normal.     Pupils: Pupils are equal, round, and reactive to light.  Cardiovascular:     Rate and Rhythm: Normal rate.  Pulmonary:     Effort: Pulmonary effort is normal. No respiratory distress.  Abdominal:     General: There is no distension.     Palpations: Abdomen is soft.     Tenderness: There is no right CVA tenderness or left CVA tenderness.  Musculoskeletal:        General: Normal range of motion.     Cervical back: Normal range of motion.  Skin:    General: Skin is warm and dry.  Neurological:     Mental Status: She is alert.     UC Treatments / Results  Labs (all labs ordered are listed, but only abnormal results are displayed) Labs Reviewed  POCT URINALYSIS DIP (MANUAL ENTRY) - Abnormal; Notable for the following components:      Result Value   Color, UA orange (*)    Glucose, UA =100 (*)    Ketones, POC UA trace (5) (*)    Protein Ur, POC =30 (*)    Nitrite, UA Positive (*)    Leukocytes, UA Large (3+) (*)    All other components within normal limits    EKG   Radiology No results  found.  Procedures Procedures (including critical care time)  Medications Ordered in UC Medications - No data to display  Initial Impression / Assessment and Plan / UC Course  I have reviewed the triage vital signs and the nursing notes.  Pertinent labs & imaging results that were available during my care of the patient were reviewed by me and considered in my medical decision making (see chart for details).     Cautioned against using the Azo product just prior to coming in for medical visit as it does interfere with testing  Final Clinical Impressions(s) / UC Diagnoses   Final diagnoses:  Lower urinary tract infectious disease     Discharge Instructions      Drink lots of fluids Take cephalexin 2 times a day for 5 days May continue Azo if needed for urinary discomfort See your doctor if urinary tract infections become recurrent   ED Prescriptions     Medication Sig Dispense Auth. Provider   cephALEXin (KEFLEX) 500 MG capsule Take 1 capsule (500 mg total) by mouth 2 (two) times daily. 10 capsule Raylene Everts, MD      PDMP not reviewed this encounter.   Raylene Everts, MD 08/05/21 [phone]

## 2021-08-03 ENCOUNTER — Encounter: Payer: Self-pay | Admitting: Family Medicine

## 2021-08-03 LAB — URINE CULTURE
MICRO NUMBER:: 12682657
SPECIMEN QUALITY:: ADEQUATE

## 2021-08-04 ENCOUNTER — Telehealth: Payer: Self-pay

## 2021-08-04 NOTE — Telephone Encounter (Signed)
Pt called stating the antibiotic she was on was causing abdominal pain. Pt states she has since stopped her antibiotic and has had much improvement. Pt notified urine culture was negative and to follow up if symptoms return.

## 2021-08-13 ENCOUNTER — Other Ambulatory Visit: Payer: Self-pay | Admitting: Family Medicine

## 2021-08-27 ENCOUNTER — Encounter: Payer: Self-pay | Admitting: Family Medicine

## 2021-08-27 ENCOUNTER — Other Ambulatory Visit: Payer: Self-pay

## 2021-08-27 ENCOUNTER — Ambulatory Visit: Payer: BC Managed Care – PPO | Admitting: Family Medicine

## 2021-08-27 VITALS — BP 119/52 | HR 67 | Ht 67.0 in | Wt 219.0 lb

## 2021-08-27 DIAGNOSIS — I10 Essential (primary) hypertension: Secondary | ICD-10-CM

## 2021-08-27 DIAGNOSIS — M546 Pain in thoracic spine: Secondary | ICD-10-CM | POA: Diagnosis not present

## 2021-08-27 DIAGNOSIS — R232 Flushing: Secondary | ICD-10-CM | POA: Diagnosis not present

## 2021-08-27 DIAGNOSIS — F320 Major depressive disorder, single episode, mild: Secondary | ICD-10-CM | POA: Diagnosis not present

## 2021-08-27 MED ORDER — ESCITALOPRAM OXALATE 10 MG PO TABS: 10.0000 mg | ORAL_TABLET | Freq: Every day | ORAL | 1 refills | Status: DC

## 2021-08-27 MED ORDER — LOSARTAN POTASSIUM 50 MG PO TABS: 50.0000 mg | ORAL_TABLET | Freq: Every evening | ORAL | 1 refills | Status: DC

## 2021-08-27 MED ORDER — HYDROCHLOROTHIAZIDE 12.5 MG PO CAPS: 12.5000 mg | ORAL_CAPSULE | Freq: Every morning | ORAL | 1 refills | Status: DC

## 2021-08-27 NOTE — Progress Notes (Signed)
Established Patient Office Visit  Subjective:  Patient ID: Shannon May, female    DOB: [birth year]  Age: 50 y.o. MRN: [id number]  CC:  Chief Complaint  Patient presents with   Hypertension   Anxiety    HPI Shannon May presents for   Hypertension- Pt denies chest pain, SOB, dizziness, or heart palpitations.  Taking meds as directed w/o problems.  Denies medication side effects.  He typically splits her blood pressure pill in half and takes half in the morning and half in the evening she says she feels better that way but is tired of actually splitting it.  D/U depression - only taking half her lexapro.  She says she is really been debating about going back up to 10 mg she just feels like over the last year she has been a little bit more irritable with her husband.  And just feeling like she cannot turn her mind off it is constantly running and working.  She does have hot flashes that occasionally wake her up at night.  Also been having some intermittent right-sided mid back pain.  It feels like a burning sensation.  She notices it mostly when she gets ready to lay down at night.  During the day it does not seem to bother her very much she has not tried any treatment such as heat or anti-inflammatories etc.  As well as let me know that she had been experiencing what she felt like was a couple of bladder infections but the last time they actually told her her urine culture was negative she wonders if some of it could be menopause related.  Past Medical History:  Diagnosis Date   Anxiety    Diverticulosis    Hypertension 11/08/2017   Malignant neoplasm of upper-outer quadrant of left breast in female, estrogen receptor positive ([place]) 06/08/2018   Sinusitis 11/15/2017    Past Surgical History:  Procedure Laterality Date   BREAST RECONSTRUCTION WITH PLACEMENT OF TISSUE EXPANDER AND ALLODERM N/A 08/14/2018   Procedure: BREAST RECONSTRUCTION WITH PLACEMENT OF TISSUE EXPANDER AND  FLEXHD;  Surgeon: Wallace Going, DO;  Location: [facility];  Service: Plastics;  Laterality: N/A;   BREAST SURGERY     DILATION AND CURETTAGE OF UTERUS     LASIK     LIPOSUCTION Bilateral 11/15/2018   Procedure: LIPOSUCTION;  Surgeon: Wallace Going, DO;  Location: [facility];  Service: Plastics;  Laterality: Bilateral;   LIPOSUCTION WITH LIPOFILLING Bilateral 02/22/2019   Procedure: LIPOSUCTION WITH LIPOFILLING;  Surgeon: Wallace Going, DO;  Location: [facility];  Service: Plastics;  Laterality: Bilateral;  2 hours, please   MASTECTOMY W/ SENTINEL NODE BIOPSY Bilateral 08/14/2018   with breast reconstruction   MASTECTOMY W/ SENTINEL NODE BIOPSY Bilateral 08/14/2018   Procedure: BILATERAL MASTECTOMIES  WITH LEFT SENTINEL LYMPH NODE MAPPING;  Surgeon: Jovita Kussmaul, MD;  Location: [facility];  Service: General;  Laterality: Bilateral;   REMOVAL OF BILATERAL TISSUE EXPANDERS WITH PLACEMENT OF BILATERAL BREAST IMPLANTS Bilateral 11/15/2018   Procedure: removal of bilateral expanders and placement of bilateral silicone implants.;  Surgeon: Wallace Going, DO;  Location: [facility];  Service: Plastics;  Laterality: Bilateral;    Family History  Problem Relation Age of Onset   Hypertension Mother    Hyperlipidemia Father    Heart attack Maternal Grandmother    Colon cancer Maternal Grandfather    Melanoma Paternal Grandfather    Vaginal cancer Maternal Aunt  Social History   Socioeconomic History   Marital status: Married    Spouse name: Ronalee Belts   Number of children: 3   Years of education: Masters   Highest education level: Not on file  Occupational History   Occupation: Education officer, museum    Comment: [employer]    Employer: [employer]  Tobacco Use   Smoking status: Former    Types: Cigarettes    Quit date: 06/13/1988    Years since quitting: 33.2   Smokeless tobacco: Never   Tobacco comments:    She smoked a  couple of months in her teens.   Vaping Use   Vaping Use: Never used  Substance and Sexual Activity   Alcohol use: Yes    Alcohol/week: 3.0 standard drinks    Types: 3 Glasses of wine per week    Comment: weekly   Drug use: No   Sexual activity: Not on file  Other Topics Concern   Not on file  Social History Narrative   Treadmill 5 days per week 2 caffeine drinks per day.    Social Determinants of Health   Financial Resource Strain: Not on file  Food Insecurity: Not on file  Transportation Needs: Not on file  Physical Activity: Not on file  Stress: Not on file  Social Connections: Not on file  Intimate Partner Violence: Not on file    Outpatient Medications Prior to Visit  Medication Sig Dispense Refill   acetaminophen (TYLENOL) 500 MG tablet Take 1,000 mg by mouth every 6 (six) hours as needed for moderate pain.     Ascorbic Acid (VITAMIN C PO) Take 1 tablet by mouth daily.      cholecalciferol (VITAMIN D3) 25 MCG (1000 UNIT) tablet Take 1,000 Units by mouth daily.     Docusate Calcium (STOOL SOFTENER PO) Take 1 tablet by mouth 2 (two) times daily.      fluticasone (FLONASE) 50 MCG/ACT nasal spray PLACE 2 SPRAYS INTO THE NOSE DAILY (Patient taking differently: Place 2 sprays into both nostrils daily.) 16 g 1   ibuprofen (ADVIL) 200 MG tablet Take 200 mg by mouth every 6 (six) hours as needed for moderate pain.     loratadine (CLARITIN) 10 MG tablet Take 10 mg by mouth daily.     Multiple Vitamins-Minerals (MULTIVITAMIN WOMEN PO) Take 1 capsule by mouth daily.     Probiotic Product (PRO-BIOTIC BLEND PO) Take 1 tablet by mouth daily.      zinc gluconate 50 MG tablet Take 50 mg by mouth daily.     escitalopram (LEXAPRO) 10 MG tablet TAKE 1 TABLET (10 MG TOTAL) BY MOUTH DAILY. OFFICE VISIT REQUIRED PRIOR TO ANY FURTHER REFILLS 30 tablet 0   losartan-hydrochlorothiazide (HYZAAR) 50-12.5 MG tablet TAKE 1 TABLET BY MOUTH EVERY DAY 90 tablet 3   cephALEXin (KEFLEX) 500 MG capsule  Take 1 capsule (500 mg total) by mouth 2 (two) times daily. 10 capsule 0   No facility-administered medications prior to visit.    Allergies  Allergen Reactions   Antihistamines, Chlorpheniramine-Type Shortness Of Breath    Makes heart race   Sudafed [Pseudoephedrine Hcl]     shaking   Sulfamethoxazole Nausea And Vomiting   Erythromycin Nausea Only    ROS Review of Systems    Objective:    Physical Exam  BP (!) 119/52    Pulse 67    Ht 5\' 7"  (1.702 m)    Wt 219 lb (99.3 kg)    SpO2 99%  BMI 34.30 kg/m  Wt Readings from Last 3 Encounters:  08/27/21 219 lb (99.3 kg)  07/17/21 217 lb 1.9 oz (98.5 kg)  05/27/21 215 lb (97.5 kg)     There are no preventive care reminders to display for this patient.   There are no preventive care reminders to display for this patient.  Lab Results  Component Value Date   TSH 3.457 07/10/2020   Lab Results  Component Value Date   WBC 5.9 12/16/2020   HGB 13.7 12/16/2020   HCT 42.1 12/16/2020   MCV 95.9 12/16/2020   PLT 294 12/16/2020   Lab Results  Component Value Date   NA 138 12/16/2020   K 4.0 12/16/2020   CO2 27 12/16/2020   GLUCOSE 93 12/16/2020   BUN 17 12/16/2020   CREATININE 0.67 12/16/2020   BILITOT 0.5 12/16/2020   ALKPHOS 58 07/10/2020   AST 22 12/16/2020   ALT 22 12/16/2020   PROT 6.9 12/16/2020   ALBUMIN 3.9 07/10/2020   CALCIUM 9.5 12/16/2020   ANIONGAP 10 07/10/2020   Lab Results  Component Value Date   CHOL 197 12/16/2020   Lab Results  Component Value Date   HDL 44 (L) 12/16/2020   Lab Results  Component Value Date   LDLCALC 131 (H) 12/16/2020   Lab Results  Component Value Date   TRIG 111 12/16/2020   Lab Results  Component Value Date   CHOLHDL 4.5 12/16/2020   No results found for: HGBA1C    Assessment & Plan:   Problem List Items Addressed This Visit       Cardiovascular and Mediastinum   Hypertension - Primary    We will switch to 12.5 HCTZ in the morning and just take a  full losartan in the evening and see if that works well.  Blood pressure looks fantastic today.      Relevant Medications   hydrochlorothiazide (MICROZIDE) 12.5 MG capsule   losartan (COZAAR) 50 MG tablet   Other Relevant Orders   BASIC METABOLIC PANEL WITH GFR     Other   Depression, major, single episode, mild ([place])    Should be back up to a full tab of Lexapro daily for at least 4 to 6 weeks to see if she notices improvement in her mood and less mind racing.  And if she still not feeling better then to let me know.  We can always go back down at some point if needed.  Planning on retiring from [employer] in the spring and is currently applying for collegiate position jobs.      Relevant Medications   escitalopram (LEXAPRO) 10 MG tablet   Other Visit Diagnoses     Acute right-sided thoracic back pain       Hot flashes       Relevant Medications   hydrochlorothiazide (MICROZIDE) 12.5 MG capsule   losartan (COZAAR) 50 MG tablet       Right sided mid back pain - given H.O with stretches to do on her own.  Recommend heat and/or ice as well as anti-inflammatory if not improving over the next couple of weeks and we can work-up further.  With her prior history of breast cancer she says always worries her in the back of her mind that it could be cancer again.  History of peritoneal irritation-again initially thinking it was possibly a bladder infection with a urine culture came back negative I did encourage her to schedule an appointment with GYN for further  evaluation it may just be some postmenopausal vaginal dryness causing some irritation but she could also have some bladder prolapse etc. causing some irritative symptoms so would encourage her to schedule that at her convenience.  Meds ordered this encounter  Medications   hydrochlorothiazide (MICROZIDE) 12.5 MG capsule    Sig: Take 1 capsule (12.5 mg total) by mouth in the morning.    Dispense:  90 capsule    Refill:  1    losartan (COZAAR) 50 MG tablet    Sig: Take 1 tablet (50 mg total) by mouth at bedtime.    Dispense:  90 tablet    Refill:  1   escitalopram (LEXAPRO) 10 MG tablet    Sig: Take 1 tablet (10 mg total) by mouth daily.    Dispense:  90 tablet    Refill:  1    Follow-up: Return in about 6 months (around 02/25/2022) for Hypertension and Mood.    Beatrice Lecher, MD

## 2021-08-27 NOTE — Assessment & Plan Note (Signed)
Should be back up to a full tab of Lexapro daily for at least 4 to 6 weeks to see if she notices improvement in her mood and less mind racing.  And if she still not feeling better then to let me know.  We can always go back down at some point if needed.  Planning on retiring from [employer] in the spring and is currently applying for collegiate position jobs.

## 2021-08-27 NOTE — Assessment & Plan Note (Signed)
We will switch to 12.5 HCTZ in the morning and just take a full losartan in the evening and see if that works well.  Blood pressure looks fantastic today.

## 2021-08-28 LAB — BASIC METABOLIC PANEL WITH GFR
BUN: 17 mg/dL (ref 7–25)
CO2: 30 mmol/L (ref 20–32)
Calcium: 10.2 mg/dL (ref 8.6–10.4)
Chloride: 102 mmol/L (ref 98–110)
Creat: 0.7 mg/dL (ref 0.50–1.03)
Glucose, Bld: 119 mg/dL — ABNORMAL HIGH (ref 65–99)
Potassium: 3.8 mmol/L (ref 3.5–5.3)
Sodium: 141 mmol/L (ref 135–146)
eGFR: 104 mL/min/{1.73_m2} (ref >=60)

## 2021-08-28 NOTE — Progress Notes (Signed)
Your lab work is within acceptable range and there are no concerning findings.   ?

## 2021-09-14 ENCOUNTER — Ambulatory Visit (INDEPENDENT_AMBULATORY_CARE_PROVIDER_SITE_OTHER): Payer: BC Managed Care – PPO

## 2021-09-14 ENCOUNTER — Encounter: Payer: Self-pay | Admitting: Family Medicine

## 2021-09-14 ENCOUNTER — Ambulatory Visit: Payer: BC Managed Care – PPO | Admitting: Family Medicine

## 2021-09-14 ENCOUNTER — Other Ambulatory Visit: Payer: Self-pay

## 2021-09-14 VITALS — BP 123/73 | HR 97 | Temp 98.1°F | Ht 67.5 in | Wt 218.0 lb

## 2021-09-14 DIAGNOSIS — R1032 Left lower quadrant pain: Secondary | ICD-10-CM

## 2021-09-14 DIAGNOSIS — K5792 Diverticulitis of intestine, part unspecified, without perforation or abscess without bleeding: Secondary | ICD-10-CM

## 2021-09-14 LAB — POCT URINALYSIS DIP (CLINITEK)
Bilirubin, UA: NEGATIVE
Blood, UA: NEGATIVE
Glucose, UA: NEGATIVE mg/dL
Nitrite, UA: NEGATIVE
POC PROTEIN,UA: NEGATIVE
Spec Grav, UA: >=1.03 — AB (ref 1.010–1.025)
Urobilinogen, UA: 1 E.U./dL
pH, UA: 5.5 (ref 5.0–8.0)

## 2021-09-14 MED ORDER — METRONIDAZOLE 500 MG PO TABS: 500.0000 mg | ORAL_TABLET | Freq: Three times a day (TID) | ORAL | 0 refills | Status: AC

## 2021-09-14 MED ORDER — IOHEXOL 300 MG/ML  SOLN
100.0000 mL | Freq: Once | INTRAMUSCULAR | Status: AC | PRN
  Administered 2021-09-14: 100 mL via INTRAVENOUS

## 2021-09-14 MED ORDER — AMOXICILLIN-POT CLAVULANATE 875-125 MG PO TABS: 1.0000 | ORAL_TABLET | Freq: Two times a day (BID) | ORAL | 0 refills | Status: AC

## 2021-09-14 NOTE — Addendum Note (Signed)
Addended by: Caleen Jobs B on: 09/14/2021 05:13 PM   Modules accepted: Orders

## 2021-09-14 NOTE — Patient Instructions (Signed)
Urine, blood work today CT abdomen ordered We will let you know results and plan

## 2021-09-14 NOTE — Progress Notes (Signed)
Acute Office Visit  Subjective:    Patient ID: Shannon May, female    DOB: [birth year], 51 y.o.   MRN: [id number]  Chief Complaint  Patient presents with   Abdominal Pain    HPI Patient is in today for left-sided abdominal pain.   Patient states that last Tuesday her stomach was bothering her and she felt constipation so she took some metamucil. States it cleaned her out and she went the next 3 days without a bowel movement. By Friday night her abdomen was hurting very bad. States pain has localized to left side of abdomen, it hurts to pass gas, and she is only passing very small hard stools. States she tried taking Miralax this morning and that allowed for a slightly better bowel movement, but she is still hurting, even when she walks. States she has had diverticulitis 4 times in the past and each time presented like this - constipation and abdominal pain. Pain can get up to 6/10 and she can be nauseous at times. Her urine has been slightly darker than usual, but no other urinary symptoms. She denies any blood in her stool or urine, fevers, vomiting, diarrhea.      Past Medical History:  Diagnosis Date   Anxiety    Diverticulosis    Hypertension 11/08/2017   Malignant neoplasm of upper-outer quadrant of left breast in female, estrogen receptor positive ([facility]) 06/08/2018   Sinusitis 11/15/2017    Past Surgical History:  Procedure Laterality Date   BREAST RECONSTRUCTION WITH PLACEMENT OF TISSUE EXPANDER AND ALLODERM N/A 08/14/2018   Procedure: BREAST RECONSTRUCTION WITH PLACEMENT OF TISSUE EXPANDER AND FLEXHD;  Surgeon: Peggye Form, DO;  [facility];  Service: Plastics;  Laterality: N/A;   BREAST SURGERY     DILATION AND CURETTAGE OF UTERUS     LASIK     LIPOSUCTION Bilateral 11/15/2018   Procedure: LIPOSUCTION;  Surgeon: Peggye Form, DO;  Location: [facility];  Service: Plastics;  Laterality: Bilateral;   LIPOSUCTION WITH LIPOFILLING Bilateral  02/22/2019   Procedure: LIPOSUCTION WITH LIPOFILLING;  Surgeon: Peggye Form, DO;  Location: [facility];  Service: Plastics;  Laterality: Bilateral;  2 hours, please   MASTECTOMY W/ SENTINEL NODE BIOPSY Bilateral 08/14/2018   with breast reconstruction   MASTECTOMY W/ SENTINEL NODE BIOPSY Bilateral 08/14/2018   Procedure: BILATERAL MASTECTOMIES  WITH LEFT SENTINEL LYMPH NODE MAPPING;  Surgeon: Griselda Miner, MD;  Location[facility];  Service: General;  Laterality: Bilateral;   REMOVAL OF BILATERAL TISSUE EXPANDERS WITH PLACEMENT OF BILATERAL BREAST IMPLANTS Bilateral 11/15/2018   Procedure: removal of bilateral expanders and placement of bilateral silicone implants.;  Surgeon: Peggye Form, DO;  Location: [facility];  Service: Plastics;  Laterality: Bilateral;    Family History  Problem Relation Age of Onset   Hypertension Mother    Hyperlipidemia Father    Heart attack Maternal Grandmother    Colon cancer Maternal Grandfather    Melanoma Paternal Grandfather    Vaginal cancer Maternal Aunt     Social History   Socioeconomic History   Marital status: Married    Spouse name: Kathlene November   Number of children: 3   Years of education: Masters   [employer] education level: Not on file  Occupational History   Occupation: Engineer, site    Comment: [employer]    Employer: [employer]  Tobacco Use   Smoking status: Former    Types: Cigarettes  Quit date: 06/13/1988    Years since quitting: 33.2   Smokeless tobacco: Never   Tobacco comments:    She smoked a couple of months in her teens.   Vaping Use   Vaping Use: Never used  Substance and Sexual Activity   Alcohol use: Yes    Alcohol/week: 3.0 standard drinks    Types: 3 Glasses of wine per week    Comment: weekly   Drug use: No   Sexual activity: Not on file  Other Topics Concern   Not on file  Social History Narrative   Treadmill 5 days per week 2 caffeine drinks per day.     Social Determinants of Health   Financial Resource Strain: Not on file  Food Insecurity: Not on file  Transportation Needs: Not on file  Physical Activity: Not on file  Stress: Not on file  Social Connections: Not on file  Intimate Partner Violence: Not on file    Outpatient Medications Prior to Visit  Medication Sig Dispense Refill   acetaminophen (TYLENOL) 500 MG tablet Take 1,000 mg by mouth every 6 (six) hours as needed for moderate pain.     Ascorbic Acid (VITAMIN C PO) Take 1 tablet by mouth daily.      cholecalciferol (VITAMIN D3) 25 MCG (1000 UNIT) tablet Take 1,000 Units by mouth daily.     Docusate Calcium (STOOL SOFTENER PO) Take 1 tablet by mouth 2 (two) times daily.      escitalopram (LEXAPRO) 10 MG tablet Take 1 tablet (10 mg total) by mouth daily. 90 tablet 1   fluticasone (FLONASE) 50 MCG/ACT nasal spray PLACE 2 SPRAYS INTO THE NOSE DAILY (Patient taking differently: Place 2 sprays into both nostrils daily.) 16 g 1   hydrochlorothiazide (MICROZIDE) 12.5 MG capsule Take 1 capsule (12.5 mg total) by mouth in the morning. 90 capsule 1   ibuprofen (ADVIL) 200 MG tablet Take 200 mg by mouth every 6 (six) hours as needed for moderate pain.     loratadine (CLARITIN) 10 MG tablet Take 10 mg by mouth daily.     losartan (COZAAR) 50 MG tablet Take 1 tablet (50 mg total) by mouth at bedtime. 90 tablet 1   Multiple Vitamins-Minerals (MULTIVITAMIN WOMEN PO) Take 1 capsule by mouth daily.     Probiotic Product (PRO-BIOTIC BLEND PO) Take 1 tablet by mouth daily.      zinc gluconate 50 MG tablet Take 50 mg by mouth daily.     No facility-administered medications prior to visit.    Allergies  Allergen Reactions   Antihistamines, Chlorpheniramine-Type Shortness Of Breath    Makes heart race   Sudafed [Pseudoephedrine Hcl]     shaking   Sulfamethoxazole Nausea And Vomiting   Erythromycin Nausea Only    Review of Systems All review of systems negative except what is listed in  the HPI     Objective:    Physical Exam Vitals reviewed.  Constitutional:      Appearance: She is well-developed.  HENT:     Head: Normocephalic and atraumatic.  Cardiovascular:     Rate and Rhythm: Normal rate and regular rhythm.  Pulmonary:     Effort: Pulmonary effort is normal.     Breath sounds: Normal breath sounds.  Abdominal:     General: Abdomen is flat. Bowel sounds are normal.     Palpations: Abdomen is soft. There is no mass.     Tenderness: There is abdominal tenderness in the left upper quadrant and left  lower quadrant. There is guarding. There is no right CVA tenderness or left CVA tenderness.  Skin:    General: Skin is warm and dry.  Neurological:     General: No focal deficit present.     Mental Status: She is alert and oriented to person, place, and time.  Psychiatric:        Mood and Affect: Mood normal.        Behavior: Behavior normal.    BP 123/73 (BP Location: Right Arm, Patient Position: Sitting, Cuff Size: Large)    Pulse 97    Temp 98.1 F (36.7 C) (Oral)    Ht 5' 7.5" (1.715 m)    Wt 218 lb (98.9 kg)    SpO2 100%    BMI 33.64 kg/m  Wt Readings from Last 3 Encounters:  09/14/21 218 lb (98.9 kg)  08/27/21 219 lb (99.3 kg)  07/17/21 217 lb 1.9 oz (98.5 kg)    There are no preventive care reminders to display for this patient.  There are no preventive care reminders to display for this patient.   Lab Results  Component Value Date   TSH 3.457 07/10/2020   Lab Results  Component Value Date   WBC 5.9 12/16/2020   HGB 13.7 12/16/2020   HCT 42.1 12/16/2020   MCV 95.9 12/16/2020   PLT 294 12/16/2020   Lab Results  Component Value Date   NA 141 08/27/2021   K 3.8 08/27/2021   CO2 30 08/27/2021   GLUCOSE 119 (H) 08/27/2021   BUN 17 08/27/2021   CREATININE 0.70 08/27/2021   BILITOT 0.5 12/16/2020   ALKPHOS 58 07/10/2020   AST 22 12/16/2020   ALT 22 12/16/2020   PROT 6.9 12/16/2020   ALBUMIN 3.9 07/10/2020   CALCIUM 10.2 08/27/2021    ANIONGAP 10 07/10/2020   EGFR 104 08/27/2021   Lab Results  Component Value Date   CHOL 197 12/16/2020   Lab Results  Component Value Date   HDL 44 (L) 12/16/2020   Lab Results  Component Value Date   LDLCALC 131 (H) 12/16/2020   Lab Results  Component Value Date   TRIG 111 12/16/2020   Lab Results  Component Value Date   CHOLHDL 4.5 12/16/2020   No results found for: HGBA1C     Assessment & Plan:    1. Left lower quadrant abdominal pain Significant tenderness on exam and historical presentation for diverticulitis per patient CBC, CMP, CT ordered. Also checking UA as she reports darker urine than usual and would like [place] to go ahead and make sure all is well - no other symptoms. Patient aware of signs/symptoms requiring further/urgent evaluation. Will update her with results and plan.   - CT ABDOMEN PELVIS W CONTRAST - CBC with Differential/Platelet - Comprehensive metabolic panel - POCT URINALYSIS DIP (CLINITEK) - Urine Culture - Urinalysis, Routine w reflex microscopic   Follow-up pending results or as needed.   Terrilyn Saver, NP

## 2021-09-15 LAB — COMPREHENSIVE METABOLIC PANEL
AG Ratio: 1.6 (calc) (ref 1.0–2.5)
ALT: 17 U/L (ref 6–29)
AST: 17 U/L (ref 10–35)
Albumin: 4.4 g/dL (ref 3.6–5.1)
Alkaline phosphatase (APISO): 65 U/L (ref 37–153)
BUN: 16 mg/dL (ref 7–25)
CO2: 29 mmol/L (ref 20–32)
Calcium: 9.7 mg/dL (ref 8.6–10.4)
Chloride: 102 mmol/L (ref 98–110)
Creat: 0.64 mg/dL (ref 0.50–1.03)
Globulin: 2.7 g/dL (calc) (ref 1.9–3.7)
Glucose, Bld: 86 mg/dL (ref 65–99)
Potassium: 4.4 mmol/L (ref 3.5–5.3)
Sodium: 140 mmol/L (ref 135–146)
Total Bilirubin: 0.4 mg/dL (ref 0.2–1.2)
Total Protein: 7.1 g/dL (ref 6.1–8.1)

## 2021-09-15 LAB — CBC WITH DIFFERENTIAL/PLATELET
Absolute Monocytes: 637 cells/uL (ref 200–950)
Basophils Absolute: 40 cells/uL (ref 0–200)
Basophils Relative: 0.6 %
Eosinophils Absolute: 168 cells/uL (ref 15–500)
Eosinophils Relative: 2.5 %
HCT: 37.4 % (ref 35.0–45.0)
Hemoglobin: 12.7 g/dL (ref 11.7–15.5)
Lymphs Abs: 2526 cells/uL (ref 850–3900)
MCH: 32.3 pg (ref 27.0–33.0)
MCHC: 34 g/dL (ref 32.0–36.0)
MCV: 95.2 fL (ref 80.0–100.0)
MPV: 11.3 fL (ref 7.5–12.5)
Monocytes Relative: 9.5 %
Neutro Abs: 3330 cells/uL (ref 1500–7800)
Neutrophils Relative %: 49.7 %
Platelets: 279 10*3/uL (ref 140–400)
RBC: 3.93 10*6/uL (ref 3.80–5.10)
RDW: 11.7 % (ref 11.0–15.0)
Total Lymphocyte: 37.7 %
WBC: 6.7 10*3/uL (ref 3.8–10.8)

## 2021-09-16 LAB — URINALYSIS, ROUTINE W REFLEX MICROSCOPIC
Bacteria, UA: NONE SEEN /HPF
Bilirubin Urine: NEGATIVE
Glucose, UA: NEGATIVE
Hgb urine dipstick: NEGATIVE
Hyaline Cast: NONE SEEN /LPF
Nitrite: NEGATIVE
Protein, ur: NEGATIVE
Specific Gravity, Urine: 1.026 (ref 1.001–1.035)
WBC, UA: NONE SEEN /HPF (ref 0–5)
pH: 5.5 (ref 5.0–8.0)

## 2021-09-16 LAB — MICROSCOPIC MESSAGE

## 2021-09-16 LAB — URINE CULTURE
MICRO NUMBER:: 12849539
Result:: NO GROWTH
SPECIMEN QUALITY:: ADEQUATE

## 2021-10-16 ENCOUNTER — Other Ambulatory Visit: Payer: Self-pay

## 2021-10-16 ENCOUNTER — Emergency Department
Admission: EM | Admit: 2021-10-16 | Discharge: 2021-10-16 | Disposition: A | Discharge status: Home / Self Care | Payer: BC Managed Care – PPO | Source: Home / Self Care

## 2021-10-16 DIAGNOSIS — N3001 Acute cystitis with hematuria: Secondary | ICD-10-CM

## 2021-10-16 DIAGNOSIS — R309 Painful micturition, unspecified: Secondary | ICD-10-CM | POA: Diagnosis not present

## 2021-10-16 LAB — POCT URINALYSIS DIP (MANUAL ENTRY)
Bilirubin, UA: NEGATIVE
Glucose, UA: NEGATIVE mg/dL
Ketones, POC UA: NEGATIVE mg/dL
Nitrite, UA: NEGATIVE
Protein Ur, POC: NEGATIVE mg/dL
Spec Grav, UA: 1.015 (ref 1.010–1.025)
Urobilinogen, UA: 0.2 E.U./dL
pH, UA: 7 (ref 5.0–8.0)

## 2021-10-16 MED ORDER — NITROFURANTOIN MONOHYD MACRO 100 MG PO CAPS: 100.0000 mg | ORAL_CAPSULE | Freq: Two times a day (BID) | ORAL | 0 refills | Status: AC

## 2021-10-16 NOTE — ED Provider Notes (Signed)
[name]    CSN: [id number] Arrival date & time: 10/16/21  0856      History   Chief Complaint Chief Complaint  Patient presents with   Urinary Urgency    Urinary urgency, odor of urine, abdominal pressure and pain with urination. X4 days    HPI Shannon May is a 51 y.o. female.   HPI 51 year old female presents with dysuria, odor,  and urgency for 4 days.  PMH H significant for HTN and breast cancer.  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   Symptomatic mammary hypertrophy 06/08/2018   Malignant neoplasm of upper-outer quadrant of left breast in female, estrogen receptor positive ([place]) 06/08/2018   Nodule of ear canal, bilateral 11/15/2017   Hypertension 11/08/2017   Venous stasis 05/13/2016   Lumbar radiculitis 05/23/2014   Left subacromial bursitis 11/30/2012   DERMATITIS, 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/06/2010   HEADACHE 04/06/2010   HEEL PAIN, RIGHT 03/12/2009   ALLERGIC RHINITIS 05/09/2008    Past Surgical History:  Procedure Laterality Date   BREAST RECONSTRUCTION WITH PLACEMENT OF TISSUE EXPANDER AND ALLODERM N/A 08/14/2018   Procedure: BREAST RECONSTRUCTION WITH PLACEMENT OF TISSUE EXPANDER AND FLEXHD;  Surgeon: Wallace Going, DO;  Location: [facility];  Service: Plastics;  Laterality: N/A;   BREAST SURGERY     DILATION AND CURETTAGE OF UTERUS     LASIK     LIPOSUCTION Bilateral  11/15/2018   Procedure: LIPOSUCTION;  Surgeon: Wallace Going, DO;  Location: [facility];  Service: Plastics;  Laterality: Bilateral;   LIPOSUCTION WITH LIPOFILLING Bilateral 02/22/2019   Procedure: LIPOSUCTION WITH LIPOFILLING;  Surgeon: Wallace Going, DO;  Location: [facility];  Service: Plastics;  Laterality: Bilateral;  2 hours, please   MASTECTOMY W/ SENTINEL NODE BIOPSY Bilateral 08/14/2018   with breast reconstruction   MASTECTOMY W/ SENTINEL NODE BIOPSY Bilateral 08/14/2018   Procedure: BILATERAL MASTECTOMIES  WITH LEFT SENTINEL LYMPH NODE MAPPING;  Surgeon: Jovita Kussmaul, MD;  Location: [facility];  Service: General;  Laterality: Bilateral;   REMOVAL OF BILATERAL TISSUE EXPANDERS WITH PLACEMENT OF BILATERAL BREAST IMPLANTS Bilateral 11/15/2018   Procedure: removal of bilateral expanders and placement of bilateral silicone implants.;  Surgeon: Wallace Going, DO;  Location: [facility];  Service: Plastics;  Laterality: Bilateral;    OB History     Gravida  5   Para  3   Term      Preterm      AB  2   Living  3      SAB  1   IAB  1   Ectopic      Multiple      Live Births               Home Medications    Prior to Admission medications   Medication Sig Start Date End Date Taking? Authorizing Provider  acetaminophen (  TYLENOL) 500 MG tablet Take 1,000 mg by mouth every 6 (six) hours as needed for moderate pain.   Yes [provider]  Ascorbic Acid (VITAMIN C PO) Take 1 tablet by mouth daily.    Yes [provider]  cholecalciferol (VITAMIN D3) 25 MCG (1000 UNIT) tablet Take 1,000 Units by mouth daily.   Yes [provider]  Docusate Calcium (STOOL SOFTENER PO) Take 1 tablet by mouth 2 (two) times daily.    Yes [provider]  escitalopram (LEXAPRO) 10 MG tablet Take 1 tablet (10 mg total) by mouth daily. 08/27/21  Yes Hali Marry, MD  fluticasone (FLONASE) 50  MCG/ACT nasal spray PLACE 2 SPRAYS INTO THE NOSE DAILY Patient taking differently: Place 2 sprays into both nostrils daily. 04/24/14  Yes Breeback, Jade L, PA-C  hydrochlorothiazide (MICROZIDE) 12.5 MG capsule Take 1 capsule (12.5 mg total) by mouth in the morning. 08/27/21  Yes Hali Marry, MD  ibuprofen (ADVIL) 200 MG tablet Take 200 mg by mouth every 6 (six) hours as needed for moderate pain.   Yes [provider]  loratadine (CLARITIN) 10 MG tablet Take 10 mg by mouth daily.   Yes [provider]  losartan (COZAAR) 50 MG tablet Take 1 tablet (50 mg total) by mouth at bedtime. 08/27/21  Yes Hali Marry, MD  Multiple Vitamins-Minerals (MULTIVITAMIN WOMEN PO) Take 1 capsule by mouth daily.   Yes [provider]  nitrofurantoin, macrocrystal-monohydrate, (MACROBID) 100 MG capsule Take 1 capsule (100 mg total) by mouth 2 (two) times daily for 7 days. 10/16/21 10/23/21 Yes Eliezer Lofts, FNP  Probiotic Product (PRO-BIOTIC BLEND PO) Take 1 tablet by mouth daily.    Yes [provider]  zinc gluconate 50 MG tablet Take 50 mg by mouth daily.   Yes [provider]    Family History Family History  Problem Relation Age of Onset   Hypertension Mother    Hyperlipidemia Father    Heart attack Maternal Grandmother    Colon cancer Maternal Grandfather    Melanoma Paternal Grandfather    Vaginal cancer Maternal Aunt     Social History Social History   Tobacco Use   Smoking status: Former    Types: Cigarettes    Quit date: 06/13/1988    Years since quitting: 33.3   Smokeless tobacco: Never   Tobacco comments:    She smoked a couple of months in her teens.   Vaping Use   Vaping Use: Never used  Substance Use Topics   Alcohol use: Yes    Alcohol/week: 3.0 standard drinks    Types: 3 Glasses of wine per week    Comment: weekly   Drug use: No     Allergies   Antihistamines, chlorpheniramine-type; Sudafed [pseudoephedrine hcl];  Sulfamethoxazole; and Erythromycin   Review of Systems Review of Systems  Genitourinary:  Positive for dysuria and urgency.    Physical Exam Triage Vital Signs ED Triage Vitals  Enc Vitals Group     BP      Pulse      Resp      Temp      Temp src      SpO2      Weight      Height      Head Circumference      Peak Flow      Pain Score      Pain Loc      Pain Edu?  Excl. in [place]?    No data found.  Updated Vital Signs BP 110/75 (BP Location: Right Arm)    Pulse 68    Temp (!) 97.5 F (36.4 C) (Oral)    Resp 20    Ht 5\' 7"  (1.702 m)    Wt 219 lb (99.3 kg)    SpO2 99%    BMI 34.30 kg/m     Physical Exam Vitals and nursing note reviewed.  Constitutional:      General: She is not in acute distress.    Appearance: Normal appearance. She is obese. She is not ill-appearing.  HENT:     Head: Normocephalic and atraumatic.     Mouth/Throat:     Mouth: Mucous membranes are moist.     Pharynx: Oropharynx is clear.  Eyes:     Extraocular Movements: Extraocular movements intact.     Conjunctiva/sclera: Conjunctivae normal.     Pupils: Pupils are equal, round, and reactive to light.  Cardiovascular:     Rate and Rhythm: Normal rate and regular rhythm.     Pulses: Normal pulses.     Heart sounds: Normal heart sounds.  Pulmonary:     Effort: Pulmonary effort is normal.     Breath sounds: Normal breath sounds.  Abdominal:     Tenderness: There is no right CVA tenderness or left CVA tenderness.  Musculoskeletal:     Cervical back: Normal range of motion and neck supple.  Skin:    General: Skin is warm and dry.  Neurological:     General: No focal deficit present.     Mental Status: She is alert and oriented to person, place, and time.     UC Treatments / Results  Labs (all labs ordered are listed, but only abnormal results are displayed) Labs Reviewed  POCT URINALYSIS DIP (MANUAL ENTRY) - Abnormal; Notable for the following components:      Result Value    Blood, UA trace-intact (*)    Leukocytes, UA Moderate (2+) (*)    All other components within normal limits  URINE CULTURE    EKG   Radiology No results found.  Procedures Procedures (including critical care time)  Medications Ordered in UC Medications - No data to display  Initial Impression / Assessment and Plan / UC Course  I have reviewed the triage vital signs and the nursing notes.  Pertinent labs & imaging results that were available during my care of the patient were reviewed by me and considered in my medical decision making (see chart for details).     MDM: 1.  Acute cystitis with hematuria-Rx'd Macrobid. Advised patient to take medication as directed with food to completion.  Encouraged patient to increase daily water intake while taking this medication.  Advised patient we will follow-up with urine culture results once received.  Patient discharged home, hemodynamically stable. Final Clinical Impressions(s) / UC Diagnoses   Final diagnoses:  Pain with urination  Acute cystitis with hematuria     Discharge Instructions      Advised patient to take medication as directed with food to completion.  Encouraged patient to increase daily water intake while taking this medication.  Advised patient we will follow-up with urine culture results once received.     ED Prescriptions     Medication Sig Dispense Auth. Provider   nitrofurantoin, macrocrystal-monohydrate, (MACROBID) 100 MG capsule Take 1 capsule (100 mg total) by mouth 2 (two) times daily for 7 days. 14 capsule Eliezer Lofts, FNP  PDMP not reviewed this encounter.   Eliezer Lofts, [place] 10/16/21 [phone]

## 2021-10-16 NOTE — ED Triage Notes (Signed)
Pt states that she has some urinary frequency, pain with urination, odor of urine and some abdominal pressure. xdays

## 2021-10-16 NOTE — Discharge Instructions (Addendum)
Advised patient to take medication as directed with food to completion.  Encouraged patient to increase daily water intake while taking this medication.  Advised patient we will follow-up with urine culture results once received. °

## 2021-10-18 LAB — URINE CULTURE
MICRO NUMBER:: 12993422
SPECIMEN QUALITY:: ADEQUATE

## 2021-10-27 ENCOUNTER — Telehealth: Payer: Self-pay | Admitting: *Deleted

## 2021-10-27 NOTE — Telephone Encounter (Signed)
Received call from pt requesting advice from MD if okay to proceed with Vaginal estrogen cream to help alleviate side effects of menopause.  Per MD okay for pt to proceed with local vaginal application only.  Pt verbalized understanding and appreciative of advice.

## 2021-11-28 ENCOUNTER — Other Ambulatory Visit: Payer: Self-pay | Admitting: Family Medicine

## 2022-01-03 ENCOUNTER — Telehealth: Payer: BC Managed Care – PPO | Admitting: Nurse Practitioner

## 2022-01-03 DIAGNOSIS — U071 COVID-19: Secondary | ICD-10-CM | POA: Diagnosis not present

## 2022-01-03 MED ORDER — MOLNUPIRAVIR EUA 200MG CAPSULE: 4.0000 | ORAL_CAPSULE | Freq: Two times a day (BID) | ORAL | 0 refills | Status: AC

## 2022-01-03 NOTE — Patient Instructions (Signed)
You are being prescribed MOLNUPIRAVIR for COVID-19 infection.  ° ° °Please call the pharmacy or go through the drive through vs going inside if you are picking up the mediation yourself to prevent further spread. If prescribed to a [facility] affiliated pharmacy, a pharmacist will bring the medication out to your car. ° ° °ADMINISTRATION INSTRUCTIONS: °Take with or without food. Swallow the tablets whole. Don't chew, crush, or break the medications because it might not work as well ° °For each dose of the medication, you should be taking FOUR tablets at one time, TWICE a day  ° °Finish your full five-day course of Molnupiravir even if you feel better before you're done. Stopping this medication too early can make it less effective to prevent severe illness related to COVID19.   ° °Molnupiravir is prescribed for YOU ONLY. Don't share it with others, even if they have similar symptoms as you. This medication might not be right for everyone.  ° °Make sure to take steps to protect yourself and others while you're taking this medication in order to get well soon and to prevent others from getting sick with COVID-19. ° ° °**If you are of childbearing potential (any gender) - it is advised to not get pregnant while taking this medication and recommended that condoms are used for female partners the next 3 months after taking the medication out of extreme caution  ° ° °COMMON SIDE EFFECTS: °Diarrhea °Nausea  °Dizziness ° ° ° °If your COVID-19 symptoms get worse, get medical help right away. Call 911 if you experience symptoms such as worsening cough, trouble breathing, chest pain that doesn't go away, confusion, a hard time staying awake, and pale or blue-colored skin. °This medication won't prevent all COVID-19 cases from getting worse.  ° ° °

## 2022-01-03 NOTE — Progress Notes (Signed)
? ?Virtual Visit Consent  ? ?Shannon May, you are scheduled for a virtual visit with Mary-Margaret Hassell Done, [place], a [facility] provider, today.   ?  ?Just as with appointments in the office, your consent must be obtained to participate.  Your consent will be active for this visit and any virtual visit you may have with one of our providers in the next 365 days.   ?  ?If you have a MyChart account, a copy of this consent can be sent to you electronically.  All virtual visits are billed to your insurance company just like a traditional visit in the office.   ? ?As this is a virtual visit, video technology does not allow for your provider to perform a traditional examination.  This may limit your provider's ability to fully assess your condition.  If your provider identifies any concerns that need to be evaluated in person or the need to arrange testing (such as labs, EKG, etc.), we will make arrangements to do so.   ?  ?Although advances in technology are sophisticated, we cannot ensure that it will always work on either your end or our end.  If the connection with a video visit is poor, the visit may have to be switched to a telephone visit.  With either a video or telephone visit, we are not always able to ensure that we have a secure connection.    ? ?I need to obtain your verbal consent now.   Are you willing to proceed with your visit today? YES ?  ?Shannon May has provided verbal consent on 01/03/2022 for a virtual visit (video or telephone). ?  ?Mary-Margaret Hassell Done, FNP  ? ?Date: 01/03/2022 4:10 PM ? ? ?Virtual Visit via Video Note  ? ?I, Mary-Margaret Hassell Done, connected with Shannon May ([id number], 11-10-1970) on 01/03/22 at  4:15 PM EDT by a video-enabled telemedicine application and verified that I am speaking with the correct person using two identifiers. ? ?Location: ?Patient: Virtual Visit Location Patient: Home ?Provider: Virtual Visit Location Provider: Mobile ?  ?I discussed the  limitations of evaluation and management by telemedicine and the availability of in person appointments. The patient expressed understanding and agreed to proceed.   ? ?History of Present Illness: ?Shannon May is a 51 y.o. who identifies as a female who was assigned female at birth, and is being seen today for covid positive. ? ?HPI: URI  ?This is a new problem. The current episode started yesterday. The maximum temperature recorded prior to her arrival was 101 - 101.9 F. The fever has been present for 1 to 2 days. Associated symptoms include congestion, coughing, headaches, rhinorrhea and a sore throat. Pertinent negatives include no plugged ear sensation or sinus pain. The treatment provided mild relief.  Tested positive for coivd this afternoon. ?Review of Systems  ?HENT:  Positive for congestion, rhinorrhea and sore throat. Negative for sinus pain.   ?Respiratory:  Positive for cough.   ?Neurological:  Positive for headaches.  ? ?Problems:  ?Patient Active Problem List  ? Diagnosis Date Noted  ? 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 Depression, major, single episode, mild ([place]) 06/06/2020  ? Stress 06/08/2019  ? S/P breast reconstruction, bilateral 12/22/2018  ? S/P mastectomy, bilateral 09/15/2018  ? Acquired absence of breast 08/22/2018  ? Breast cancer ([place]) 08/14/2018  ? 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 Symptomatic mammary hypertrophy 06/08/2018  ? Malignant neoplasm of upper-outer quadrant of left breast in female,  estrogen receptor positive ([place]) 06/08/2018  ? Nodule of ear canal, bilateral 11/15/2017  ? Hypertension 11/08/2017  ? Venous stasis 05/13/2016  ? Lumbar radiculitis 05/23/2014  ? Left subacromial bursitis 11/30/2012  ? DERMATITIS, 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/06/2010  ? HEADACHE 04/06/2010  ? HEEL PAIN, RIGHT 03/12/2009  ? ALLERGIC RHINITIS 05/09/2008  ?  ?Allergies:  ?Allergies  ?Allergen Reactions  ? Antihistamines, Chlorpheniramine-Type Shortness Of Breath  ?  Makes heart race   ? Sudafed [Pseudoephedrine Hcl]   ?  shaking  ? Sulfamethoxazole Nausea And Vomiting  ? Erythromycin Nausea Only  ? ?Medications:  ?Current Outpatient Medications:  ?  acetaminophen (TYLENOL) 500 MG tablet, Take 1,000 mg by mouth every 6 (six) hours as needed for moderate pain., Disp: , Rfl:  ?  Ascorbic Acid (VITAMIN C PO), Take 1 tablet by mouth daily. , Disp: , Rfl:  ?  cholecalciferol (VITAMIN D3) 25 MCG (1000 UNIT) tablet, Take 1,000 Units by mouth daily., Disp: , Rfl:  ?  Docusate Calcium (STOOL SOFTENER PO), Take 1 tablet by mouth 2 (two) times daily. , Disp: , Rfl:  ?  escitalopram (LEXAPRO) 10 MG tablet, Take 1 tablet (10 mg total) by mouth daily., Disp: 90 tablet, Rfl: 1 ?  fluticasone (FLONASE) 50 MCG/ACT nasal spray, PLACE 2 SPRAYS INTO THE NOSE DAILY (Patient taking differently: Place 2 sprays into both nostrils daily.), Disp: 16 g, Rfl: 1 ?  hydrochlorothiazide (MICROZIDE) 12.5 MG capsule, Take 1 capsule (12.5 mg total) by mouth in the morning., Disp: 90 capsule, Rfl: 1 ?  ibuprofen (ADVIL) 200 MG tablet, Take 200 mg by mouth every 6 (six) hours as needed for moderate pain., Disp: , Rfl:  ?  loratadine (CLARITIN) 10 MG tablet, Take 10 mg by mouth daily., Disp: , Rfl:  ?  losartan (COZAAR) 50 MG tablet, Take 1 tablet (50 mg total) by mouth at bedtime., Disp: 90 tablet, Rfl: 1 ?  Multiple Vitamins-Minerals (MULTIVITAMIN WOMEN PO), Take 1 capsule by mouth daily., Disp: , Rfl:  ?  Probiotic Product (PRO-BIOTIC BLEND PO), Take 1 tablet by mouth daily. , Disp: , Rfl:  ?  zinc gluconate 50 MG tablet, Take 50 mg by mouth daily., Disp: , Rfl:  ? ?Observations/Objective: ?Patient is well-developed, well-nourished in no acute distress.  ?Resting comfortably  at home.  ?Head is normocephalic, atraumatic.  ?No labored breathing.  ?Speech is clear and coherent with logical content.  ?Patient is alert and oriented at baseline.  ?Face flushed ?Raspy voice ?Dry cough noted ? ?Assessment and Plan: ? ?Shannon May  in today with chief complaint of Covid Positive ? ? ?1. Positive self-administered antigen test for COVID-19 ?1. Take meds as prescribed ?2. Use a cool mist humidifier especially during the winter months and when heat has been humid. ?3. Use saline nose sprays frequently ?4. Saline irrigations of the nose can be very helpful if done frequently. ? * 4X daily for 1 week* ? * Use of a nettie pot can be helpful with this. Follow directions with this* ?5. Drink plenty of fluids ?6. Keep thermostat turn down low ?7.For any cough or congestion- mucinex or delsym ?8. For fever or aces or pains- take tylenol or ibuprofen appropriate for age and weight. ? * for fevers greater than 101 orally you may alternate ibuprofen and tylenol every  3 hours. ?  ?Meds ordered this encounter  ?Medications  ? molnupiravir EUA (LAGEVRIO) 200 mg CAPS capsule  ?  Sig: Take 4 capsules (800 mg total) by  mouth 2 (two) times daily for 5 days.  ?  Dispense:  40 capsule  ?  Refill:  0  ?  Order Specific Question:   Supervising Provider  ?  Answer:   Noemi Chapel [[id number]]  ? ? ? ? ?Follow Up Instructions: ?I discussed the assessment and treatment plan with the patient. The patient was provided an opportunity to ask questions and all were answered. The patient agreed with the plan and demonstrated an understanding of the instructions.  A copy of instructions were sent to the patient via MyChart. ? ?The patient was advised to call back or seek an in-person evaluation if the symptoms worsen or if the condition fails to improve as anticipated. ? ?Time:  ?I spent 10 minutes with the patient via telehealth technology discussing the above problems/concerns.   ? ?Mary-Margaret Hassell Done, FNP ? ?

## 2022-01-04 ENCOUNTER — Ambulatory Visit (INDEPENDENT_AMBULATORY_CARE_PROVIDER_SITE_OTHER): Payer: BC Managed Care – PPO | Admitting: Family Medicine

## 2022-01-04 DIAGNOSIS — R059 Cough, unspecified: Secondary | ICD-10-CM

## 2022-01-04 NOTE — Progress Notes (Signed)
Patient needs a PCR confirmation for COVID, for work they will not accept an over-the-counter test kit. ?

## 2022-01-04 NOTE — Progress Notes (Signed)
Lab only 

## 2022-01-05 LAB — SPECIMEN STATUS REPORT

## 2022-01-05 LAB — NOVEL CORONAVIRUS, NAA: SARS-CoV-2, NAA: NOT DETECTED

## 2022-01-05 NOTE — Progress Notes (Signed)
PCR negative for COVID. If feeling better ok to return to work

## 2022-01-06 ENCOUNTER — Encounter: Payer: Self-pay | Admitting: Physician Assistant

## 2022-01-06 ENCOUNTER — Telehealth (INDEPENDENT_AMBULATORY_CARE_PROVIDER_SITE_OTHER): Payer: BC Managed Care – PPO | Admitting: Physician Assistant

## 2022-01-06 VITALS — BP 131/89 | HR 80 | Temp 99.5°F | Ht 67.0 in | Wt 215.0 lb

## 2022-01-06 DIAGNOSIS — J40 Bronchitis, not specified as acute or chronic: Secondary | ICD-10-CM | POA: Diagnosis not present

## 2022-01-06 DIAGNOSIS — U071 COVID-19: Secondary | ICD-10-CM

## 2022-01-06 DIAGNOSIS — J329 Chronic sinusitis, unspecified: Secondary | ICD-10-CM | POA: Diagnosis not present

## 2022-01-06 MED ORDER — DEXAMETHASONE 4 MG PO TABS
4.0000 mg | ORAL_TABLET | Freq: Two times a day (BID) | ORAL | 0 refills | Status: DC
Start: 2022-01-06 — End: 2022-02-21

## 2022-01-06 MED ORDER — DOXYCYCLINE HYCLATE 100 MG PO TABS: 100.0000 mg | ORAL_TABLET | Freq: Two times a day (BID) | ORAL | 0 refills | Status: DC

## 2022-01-06 NOTE — Progress Notes (Signed)
Covid home test positive Sunday ?PCR test negative through our office ?Home test again today was positive ? ?Taking antiviral - molnupiravir ?Ear pain ?Feels like a lot of congestion ? ? ?

## 2022-01-06 NOTE — Progress Notes (Signed)
..Virtual Visit via Video Note ? ?I connected with Shannon May on 01/06/22 at  2:00 PM EDT by a video enabled telemedicine application and verified that I am speaking with the correct person using two identifiers. ? ?Location: ?Patient: home ?Provider: clinic ? ?Marland Kitchen.Participating in visit:  ?Patient: Shannon May ?Provider: Iran Planas PA-C ?  ?I discussed the limitations of evaluation and management by telemedicine and the availability of in person appointments. The patient expressed understanding and agreed to proceed. ? ?History of Present Illness: ?Pt is a 51 yo female who has ongoing cough, sinus pressure, headache, ear pain, fatigue, SOB, body aches, low grade fever since 4/29. She has had 2 home positive covid test but one negative PCR test. She needs to get back to work because she has a huge recital to get ready for and wanting help with medications. She has been taking molnupivir with little improvement. She is taking allergy medications, nasal sprays, mucinex without any benefit.  ? ? .. ?Active Ambulatory Problems  ?  Diagnosis Date Noted  ? ALLERGIC RHINITIS 05/09/2008  ? DERMATITIS, 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/06/2010  ? HEEL PAIN, RIGHT 03/12/2009  ? HEADACHE 04/06/2010  ? Left subacromial bursitis 11/30/2012  ? Lumbar radiculitis 05/23/2014  ? Venous stasis 05/13/2016  ? Hypertension 11/08/2017  ? Nodule of ear canal, bilateral 11/15/2017  ? Symptomatic mammary hypertrophy 06/08/2018  ? Malignant neoplasm of upper-outer quadrant of left breast in female, estrogen receptor positive ([place]) 06/08/2018  ? 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 Breast cancer ([place]) 08/14/2018  ? Acquired absence of breast 08/22/2018  ? S/P mastectomy, bilateral 09/15/2018  ? S/P breast reconstruction, bilateral 12/22/2018  ? Stress 06/08/2019  ? 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 ?Resolved Ambulatory Problems  ?  Diagnosis Date Noted  ? Carbuncle and furuncle  of trunk 03/19/2010  ? Verruca pedis 11/30/2012  ? Plantar fasciitis 05/23/2014  ? Abdominal pain, acute, epigastric 01/26/2016  ? Sinusitis 11/15/2017  ? Right calf pain 11/05/2020  ? ?Past Medical History:  ?Diagnosis Date  ? Anxiety   ? Diverticulosis   ? ? ?Observations/Objective: ?No acute distress ?Normal breathing ?Productive cough ?Pale appearance ? ?.. ?Today's Vitals  ? 01/06/22 1337  ?BP: 131/89  ?Pulse: 80  ?Temp: 99.5 ?F (37.5 ?C)  ?TempSrc: Oral  ?Weight: 215 lb (97.5 kg)  ?Height: '5\' 7"'$  (1.702 m)  ? ?Body mass index is 33.67 kg/m?. ? ? ? ?Assessment and Plan: ?..[place] was seen today for ear pain and covid positive. ? ?Diagnoses and all orders for this visit: ? ?Sinobronchitis ?-     dexamethasone (DECADRON) 4 MG tablet; Take 1 tablet (4 mg total) by mouth 2 (two) times daily with a meal. ?-     doxycycline (VIBRA-TABS) 100 MG tablet; Take 1 tablet (100 mg total) by mouth 2 (two) times daily. ? ?Positive self-administered antigen test for COVID-19 ? ? ?Finish molnupivir ?Start doxycycline and dexamethasone,  ?Continue symptomatic care ?Ok to go back to work as you feel like you can ?Follow up as needed or if symptoms change or worsen ? ? ? ?Follow Up Instructions: ? ?  ?I discussed the assessment and treatment plan with the patient. The patient was provided an opportunity to ask questions and all were answered. The patient agreed with the plan and demonstrated an understanding of the instructions. ?  ?The patient was advised to call back or seek an in-person evaluation if the symptoms worsen  or if the condition fails to improve as anticipated. ? ? ? ?Iran Planas, PA-C ? ?

## 2022-01-07 ENCOUNTER — Encounter: Payer: Self-pay | Admitting: Family Medicine

## 2022-01-07 NOTE — Progress Notes (Signed)
Patient advised. Letter sent via Mychart.  ?

## 2022-02-21 ENCOUNTER — Other Ambulatory Visit: Payer: Self-pay

## 2022-02-21 ENCOUNTER — Emergency Department
Admission: RE | Admit: 2022-02-21 | Discharge: 2022-02-21 | Disposition: A | Discharge status: Home / Self Care | Payer: BC Managed Care – PPO | Source: Ambulatory Visit

## 2022-02-21 VITALS — BP 114/80 | HR 73 | Temp 98.7°F | Resp 20 | Ht 67.0 in | Wt 215.0 lb

## 2022-02-21 DIAGNOSIS — B349 Viral infection, unspecified: Secondary | ICD-10-CM

## 2022-02-21 DIAGNOSIS — J029 Acute pharyngitis, unspecified: Secondary | ICD-10-CM

## 2022-02-21 DIAGNOSIS — U071 COVID-19: Secondary | ICD-10-CM

## 2022-02-21 MED ORDER — PROMETHAZINE-DM 6.25-15 MG/5ML PO SYRP: 5.0000 mL | ORAL_SOLUTION | Freq: Four times a day (QID) | ORAL | 0 refills | Status: DC | PRN

## 2022-02-21 MED ORDER — PREDNISONE 20 MG PO TABS: 20.0000 mg | ORAL_TABLET | Freq: Every day | ORAL | 0 refills | Status: DC

## 2022-02-21 MED ORDER — IPRATROPIUM BROMIDE 0.03 % NA SOLN: 2.0000 | Freq: Three times a day (TID) | NASAL | 0 refills | Status: DC | PRN

## 2022-02-21 NOTE — ED Triage Notes (Signed)
Pt presents to Urgent Care with c/o sore throat, fatigue, nasal congestion, ear fullness, and cough x 2 days. Reports positive home COVID test today.

## 2022-02-21 NOTE — Discharge Instructions (Addendum)
COVID PCR test pending will result within 48-72 hours. If positive,ok to start antivirals, you can request from urgent care provider or PCP. For throat pain, ear congestion,cough, and shortness of breath I have prescribed prednisone 20 mg one daily for 5 days. Promethazine DM for cough. Rapid strep negative. Throat culture pending. Atrovent nasal spray three times daily as needed for post nasal drainage. If any of your symptoms become severe go immediately to the emergency department.  Otherwise follow-up with primary care provider or here as needed.

## 2022-02-21 NOTE — ED Provider Notes (Signed)
Renaldo Fiddler    CSN: [id number] Arrival date & time: 02/21/22  1501      History   Chief Complaint Chief Complaint  Patient presents with   Cough   Sore Throat   Covid Positive    HPI Shannon May is a 51 y.o. female.   HPI Patient presents today for evaluation of cough, sore throat, ear fullness, nasal congestion x2 days.  She reports taking a home COVID test today which resulted as positive.  Patient also reports that she last tested for COVID + April 30 however subsequently had a negative PCR test following 2 positive home antigen test.  She denies fever.  Endorses some shortness of breath.  She has been taking ibuprofen for body aches and today started DayQuil to help manage cough.  She endorses worsening of sore throat over the last few days.  She is concerned that  she may have strep infection.  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   Symptomatic mammary hypertrophy 06/08/2018   Malignant neoplasm of upper-outer quadrant of left breast in female, estrogen receptor positive (HCC) 06/08/2018   Nodule of ear canal, bilateral 11/15/2017   Hypertension 11/08/2017   Venous stasis 05/13/2016   Lumbar radiculitis 05/23/2014   Left subacromial bursitis 11/30/2012   DERMATITIS, 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/06/2010   HEADACHE 04/06/2010   HEEL PAIN, RIGHT 03/12/2009   ALLERGIC RHINITIS 05/09/2008     Past Surgical History:  Procedure Laterality Date   BREAST RECONSTRUCTION WITH PLACEMENT OF TISSUE EXPANDER AND ALLODERM N/A 08/14/2018   Procedure: BREAST RECONSTRUCTION WITH PLACEMENT OF TISSUE EXPANDER AND FLEXHD;  Surgeon: Peggye Form, DO;  Location: [facility];  Service: Plastics;  Laterality: N/A;   BREAST SURGERY     DILATION AND CURETTAGE OF UTERUS     LASIK     LIPOSUCTION Bilateral 11/15/2018   Procedure: LIPOSUCTION;  Surgeon: Peggye Form, DO;  Location: [facility];  Service: Plastics;  Laterality: Bilateral;   LIPOSUCTION WITH LIPOFILLING Bilateral 02/22/2019   Procedure: LIPOSUCTION WITH LIPOFILLING;  Surgeon: Peggye Form, DO;  Location: [facility];  Service: Plastics;  Laterality: Bilateral;  2 hours, please   MASTECTOMY W/ SENTINEL NODE BIOPSY Bilateral 08/14/2018   with breast reconstruction   MASTECTOMY W/ SENTINEL NODE BIOPSY Bilateral 08/14/2018   Procedure: BILATERAL MASTECTOMIES  WITH LEFT SENTINEL LYMPH NODE MAPPING;  Surgeon: Griselda Miner, MD;  Location: [facility];  Service: General;  Laterality: Bilateral;   REMOVAL OF BILATERAL TISSUE EXPANDERS WITH PLACEMENT OF BILATERAL BREAST IMPLANTS Bilateral 11/15/2018   Procedure: removal of bilateral expanders and placement of bilateral silicone implants.;  Surgeon: Peggye Form, DO;  Location: [facility];  Service: Plastics;  Laterality: Bilateral;    OB History     Gravida  5   Para  3   Term      Preterm  AB  2   Living  3      SAB  1   IAB  1   Ectopic      Multiple      Live Births               Home Medications    Prior to Admission medications   Medication Sig Start Date End Date Taking? Authorizing Provider  ipratropium (ATROVENT) 0.03 % nasal spray Place 2 sprays into both nostrils 3 (three) times daily as needed for rhinitis. 02/21/22  Yes Bing Neighbors, FNP  predniSONE (DELTASONE) 20 MG tablet Take 1  tablet (20 mg total) by mouth daily with breakfast for 5 days. 02/21/22 02/26/22 Yes Bing Neighbors, FNP  promethazine-dextromethorphan (PROMETHAZINE-DM) 6.25-15 MG/5ML syrup Take 5 mLs by mouth 4 (four) times daily as needed for cough. 02/21/22  Yes Bing Neighbors, FNP  Pseudoephedrine-APAP-DM (DAYQUIL MULTI-SYMPTOM COLD/FLU PO) Take by mouth.   Yes [provider]  acetaminophen (TYLENOL) 500 MG tablet Take 1,000 mg by mouth every 6 (six) hours as needed for moderate pain.    [provider]  Ascorbic Acid (VITAMIN C PO) Take 1 tablet by mouth daily.     [provider]  cholecalciferol (VITAMIN D3) 25 MCG (1000 UNIT) tablet Take 1,000 Units by mouth daily.    [provider]  clobetasol (TEMOVATE) 0.05 % external solution Apply 1 application. topically daily. 12/19/21   [provider]  Docusate Calcium (STOOL SOFTENER PO) Take 1 tablet by mouth 2 (two) times daily.     [provider]  doxycycline (VIBRA-TABS) 100 MG tablet Take 1 tablet (100 mg total) by mouth 2 (two) times daily. 01/06/22   Breeback, Jade L, PA-C  escitalopram (LEXAPRO) 10 MG tablet Take 1 tablet (10 mg total) by mouth daily. 08/27/21   Agapito Games, MD  estradiol (ESTRACE) 0.1 MG/GM vaginal cream SMARTSIG:1 Applicator Vaginal Daily 12/29/21   [provider]  fluticasone (FLONASE) 50 MCG/ACT nasal spray PLACE 2 SPRAYS INTO THE NOSE DAILY Patient taking differently: Place 2 sprays into both nostrils daily. 04/24/14   Breeback, Lonna Cobb, PA-C  hydrochlorothiazide (MICROZIDE) 12.5 MG capsule Take 1 capsule (12.5 mg total) by mouth in the morning. 08/27/21   Agapito Games, MD  ibuprofen (ADVIL) 200 MG tablet Take 200 mg by mouth every 6 (six) hours as needed for moderate pain.    [provider]  loratadine (CLARITIN) 10 MG tablet Take 10 mg by mouth daily.    [provider]  losartan (COZAAR) 50 MG tablet Take 1 tablet (50 mg total)  by mouth at bedtime. 08/27/21   Agapito Games, MD  Multiple Vitamins-Minerals (MULTIVITAMIN WOMEN PO) Take 1 capsule by mouth daily.    [provider]  Probiotic Product (PRO-BIOTIC BLEND PO) Take 1 tablet by mouth daily.     [provider]  zinc gluconate 50 MG tablet Take 50 mg by mouth daily.    [provider]    Family History Family History  Problem Relation Age of Onset   Hypertension Mother    Hyperlipidemia Father    Heart attack Maternal Grandmother    Colon cancer Maternal Grandfather    Melanoma Paternal Grandfather    Vaginal cancer Maternal Aunt     Social History Social History   Tobacco Use   Smoking status: Former    Types: Cigarettes    Quit date: 06/13/1988    Years since quitting: 63.7  Smokeless tobacco: Never   Tobacco comments:    She smoked a couple of months in her teens.   Vaping Use   Vaping Use: Never used  Substance Use Topics   Alcohol use: Yes    Alcohol/week: 3.0 standard drinks of alcohol    Types: 3 Glasses of wine per week    Comment: weekly   Drug use: No     Allergies   Antihistamines, chlorpheniramine-type; Sudafed [pseudoephedrine hcl]; Sulfamethoxazole; and Erythromycin   Review of Systems Review of Systems Pertinent negatives listed in HPI   Physical Exam Triage Vital Signs ED Triage Vitals  Enc Vitals Group     BP 02/21/22 1531 114/80     Pulse Rate 02/21/22 1531 73     Resp 02/21/22 1531 20     Temp 02/21/22 1531 98.7 F (37.1 C)     Temp Source 02/21/22 1531 Oral     SpO2 02/21/22 1531 98 %     Weight 02/21/22 1526 215 lb (97.5 kg)     Height 02/21/22 1526 5\' 7"  (1.702 m)     Head Circumference --      Peak Flow --      Pain Score 02/21/22 1526 2     Pain Loc --      Pain Edu? --      Excl. in GC? --    No data found.  Updated Vital Signs BP 114/80 (BP Location: Right Arm)   Pulse 73   Temp 98.7 F (37.1 C) (Oral)   Resp 20   Ht 5\' 7"  (1.702 m)   Wt 215 lb (97.5  kg)   SpO2 98%   BMI 33.67 kg/m   Visual Acuity Right Eye Distance:   Left Eye Distance:   Bilateral Distance:    Right Eye Near:   Left Eye Near:    Bilateral Near:     Physical Exam Constitutional:      Appearance: She is ill-appearing.  HENT:     Head: Normocephalic and atraumatic.     Right Ear: A middle ear effusion is present.     Left Ear:  No middle ear effusion.     Nose: Congestion present.  Eyes:     Conjunctiva/sclera: Conjunctivae normal.     Pupils: Pupils are equal, round, and reactive to light.  Cardiovascular:     Rate and Rhythm: Normal rate and regular rhythm.  Pulmonary:     Effort: Pulmonary effort is normal.     Breath sounds: Normal breath sounds.  Lymphadenopathy:     Cervical: Cervical adenopathy present.  Skin:    General: Skin is warm.     Capillary Refill: Capillary refill takes less than 2 seconds.  Neurological:     General: No focal deficit present.     Mental Status: She is alert.  Psychiatric:        Mood and Affect: Mood normal.      UC Treatments / Results  Labs (all labs ordered are listed, but only abnormal results are displayed) Labs Reviewed  COVID-19, FLU A+B NAA  CULTURE, GROUP A STREP  POC SARS CORONAVIRUS 2 AG -  ED  POCT RAPID STREP A (OFFICE)    EKG   Radiology No results found.  Procedures Procedures (including critical care time)  Medications Ordered in UC Medications - No data to display  Initial Impression / Assessment and Plan / UC Course  I have reviewed the triage vital signs and the nursing notes.  Pertinent labs & imaging results that were available during my care of the patient were reviewed by me and considered in my medical decision making (see chart for details).    Positive home antigen test, repeated rapid COVID  in clinic results were negative. COVID PCR pending. Rapid strep negative. Throat culture pending. Treatment per discharge medication orders. Advised if PCR COVID is positive,  ok prescribed Molnipuvir (patient taken for COVID previously and tolerated). Symptoms management per discharge medication orders. Follow-up with PCP or return for evaluation. Final Clinical Impressions(s) / UC Diagnoses   Final diagnoses:  Positive self-administered antigen test for COVID-19  Acute pharyngitis, unspecified etiology     Discharge Instructions      COVID PCR test pending will result within 48-72 hours. If positive,ok to start antivirals, you can request from urgent care provider or PCP. For throat pain, ear congestion,cough, and shortness of breath I have prescribed prednisone 20 mg one daily for 5 days. Promethazine DM for cough. Rapid strep negative. Throat culture pending. Atrovent nasal spray three times daily as needed for post nasal drainage. If any of your symptoms become severe go immediately to the emergency department.  Otherwise follow-up with primary care provider or here as needed.     ED Prescriptions     Medication Sig Dispense Auth. Provider   predniSONE (DELTASONE) 20 MG tablet Take 1 tablet (20 mg total) by mouth daily with breakfast for 5 days. 5 tablet Bing Neighbors, FNP   promethazine-dextromethorphan (PROMETHAZINE-DM) 6.25-15 MG/5ML syrup Take 5 mLs by mouth 4 (four) times daily as needed for cough. 180 mL Bing Neighbors, FNP   ipratropium (ATROVENT) 0.03 % nasal spray Place 2 sprays into both nostrils 3 (three) times daily as needed for rhinitis. 30 mL Bing Neighbors, FNP      PDMP not reviewed this encounter.   Bing Neighbors, [place] 02/22/22 [phone]

## 2022-02-23 LAB — COVID-19, FLU A+B NAA
Influenza A, NAA: NOT DETECTED
Influenza B, NAA: NOT DETECTED
SARS-CoV-2, NAA: NOT DETECTED

## 2022-02-24 LAB — CULTURE, GROUP A STREP: Strep A Culture: NEGATIVE

## 2022-02-25 ENCOUNTER — Encounter: Payer: Self-pay | Admitting: Family Medicine

## 2022-02-25 ENCOUNTER — Ambulatory Visit: Payer: BC Managed Care – PPO | Admitting: Family Medicine

## 2022-02-25 VITALS — BP 130/68 | HR 71 | Ht 67.0 in | Wt 223.0 lb

## 2022-02-25 DIAGNOSIS — E785 Hyperlipidemia, unspecified: Secondary | ICD-10-CM

## 2022-02-25 DIAGNOSIS — F320 Major depressive disorder, single episode, mild: Secondary | ICD-10-CM

## 2022-02-25 DIAGNOSIS — F439 Reaction to severe stress, unspecified: Secondary | ICD-10-CM

## 2022-02-25 DIAGNOSIS — H9313 Tinnitus, bilateral: Secondary | ICD-10-CM

## 2022-02-25 DIAGNOSIS — H938X3 Other specified disorders of ear, bilateral: Secondary | ICD-10-CM

## 2022-02-25 DIAGNOSIS — I10 Essential (primary) hypertension: Secondary | ICD-10-CM

## 2022-02-25 DIAGNOSIS — J329 Chronic sinusitis, unspecified: Secondary | ICD-10-CM | POA: Diagnosis not present

## 2022-02-25 DIAGNOSIS — J40 Bronchitis, not specified as acute or chronic: Secondary | ICD-10-CM

## 2022-02-25 MED ORDER — AMOXICILLIN-POT CLAVULANATE 875-125 MG PO TABS: 1.0000 | ORAL_TABLET | Freq: Two times a day (BID) | ORAL | 0 refills | Status: DC

## 2022-02-25 NOTE — Assessment & Plan Note (Signed)
Much improved.  Especially now that she has completed school and she is officially retired from the school system.  Though she will be starting a new job at [employer].

## 2022-02-25 NOTE — Progress Notes (Signed)
Established Patient Office Visit  Subjective   Patient ID: Shannon May, female    DOB: 1971-02-02  Age: 51 y.o. MRN: [id number]  Chief Complaint  Patient presents with   Hypertension   mood    HPI  Hypertension- Pt denies chest pain, SOB, dizziness, or heart palpitations.  Taking meds as directed w/o problems.  Denies medication side effects.    Follow-up stress/mood-currently on Lexapro 10 mg.  PHQ-9 score of 0.  GAD-7 score of 4.  Rates symptoms as not difficult at all.  She is happy with her current regimen but is willing to try to step down to 5 mg she feels like now that she is retired and she is finished with school and starting a new job at [employer] she is much less stressed.  She has also had some upper respiratory symptoms.  She says initially she tested positive for COVID at home but went to urgent care and was evaluated with a rapid and a PCR and came back negative.  She is still having cough and nasal congestion.  No fever.  She said she was starting to feel better yesterday but this morning woke up and actually feels worse.  She also has a spot on her kidney seen on imaging that would like to keep an eye on.  She also reports that for about a year she will occasionally hear her heartbeat in her ears particularly with change in position or when she bends over and stands up.  He also has had tinnitus that has been more persistent lately it is also bilateral.  She would like consultation with ENT.    ROS    Objective:     BP 130/68   Pulse 71   Ht '5\' 7"'$  (1.702 m)   Wt 223 lb (101.2 kg)   SpO2 97%   BMI 34.93 kg/m    Physical Exam Vitals and nursing note reviewed.  Constitutional:      Appearance: She is well-developed.  HENT:     Head: Normocephalic and atraumatic.     Right Ear: Tympanic membrane, ear canal and external ear normal.     Left Ear: Tympanic membrane, ear canal and external ear normal.     Nose: Nose normal.  Eyes:      Conjunctiva/sclera: Conjunctivae normal.     Pupils: Pupils are equal, round, and reactive to light.  Neck:     Thyroid: No thyromegaly.  Cardiovascular:     Rate and Rhythm: Normal rate and regular rhythm.     Heart sounds: Normal heart sounds.  Pulmonary:     Effort: Pulmonary effort is normal.     Breath sounds: Normal breath sounds. No wheezing.  Musculoskeletal:     Cervical back: Neck supple.  Lymphadenopathy:     Cervical: No cervical adenopathy.  Skin:    General: Skin is warm and dry.  Neurological:     Mental Status: She is alert and oriented to person, place, and time.  Psychiatric:        Behavior: Behavior normal.      No results found for any visits on 02/25/22.    The 10-year ASCVD risk score (Arnett DK, et al., 2019) is: 2.6%    Assessment & Plan:   Problem List Items Addressed This Visit       Cardiovascular and Mediastinum   Hypertension    Well controlled. Continue current regimen. Follow up in  6 mo  Relevant Orders   Lipid Panel w/reflex Direct LDL   COMPLETE METABOLIC PANEL WITH GFR     Other   Stress - Primary    Much improved.  Especially now that she has completed school and she is officially retired from the school system.  Though she will be starting a new job at [employer].      Hyperlipidemia   Relevant Orders   Lipid Panel w/reflex Direct LDL   COMPLETE METABOLIC PANEL WITH GFR   Depression, major, single episode, mild (HCC)    Well on Lexapro.  We discussed going down to 5 mg.  She is can try splitting the tab for a couple of months if that is effective then she can let me know when she is due for refill and we can send in the 5 mg dose.  Otherwise follow-up in 6 months.      Other Visit Diagnoses     Sinobronchitis       Relevant Medications   amoxicillin-clavulanate (AUGMENTIN) 875-125 MG tablet   Audible heartbeat in ear, bilateral       Relevant Orders   Ambulatory referral to ENT   Tinnitus of both  ears       Relevant Orders   Ambulatory referral to ENT       Sinobronchitis-since she does feel worse today and she is on day 8 of illness some get a go ahead and treat her with Augmentin.  She is already on prednisone.  Tinnitus/heartbeat in ear-we will refer to ENT for further work-up.  If negative consider vascular work-up as well.  Return in about 6 months (around 09/07/2022) for Hypertension.    Beatrice Lecher, MD

## 2022-02-25 NOTE — Assessment & Plan Note (Signed)
Well controlled. Continue current regimen. Follow up in  6 mo  

## 2022-02-25 NOTE — Assessment & Plan Note (Signed)
Well on Lexapro.  We discussed going down to 5 mg.  She is can try splitting the tab for a couple of months if that is effective then she can let me know when she is due for refill and we can send in the 5 mg dose.  Otherwise follow-up in 6 months.

## 2022-02-27 ENCOUNTER — Other Ambulatory Visit: Payer: Self-pay | Admitting: Family Medicine

## 2022-02-27 DIAGNOSIS — I10 Essential (primary) hypertension: Secondary | ICD-10-CM

## 2022-03-04 LAB — COMPLETE METABOLIC PANEL WITH GFR
AG Ratio: 1.7 (calc) (ref 1.0–2.5)
ALT: 16 U/L (ref 6–29)
AST: 17 U/L (ref 10–35)
Albumin: 4.3 g/dL (ref 3.6–5.1)
Alkaline phosphatase (APISO): 76 U/L (ref 37–153)
BUN: 12 mg/dL (ref 7–25)
CO2: 28 mmol/L (ref 20–32)
Calcium: 9.5 mg/dL (ref 8.6–10.4)
Chloride: 101 mmol/L (ref 98–110)
Creat: 0.7 mg/dL (ref 0.50–1.03)
Globulin: 2.6 g/dL (calc) (ref 1.9–3.7)
Glucose, Bld: 104 mg/dL — ABNORMAL HIGH (ref 65–99)
Potassium: 4.2 mmol/L (ref 3.5–5.3)
Sodium: 138 mmol/L (ref 135–146)
Total Bilirubin: 0.7 mg/dL (ref 0.2–1.2)
Total Protein: 6.9 g/dL (ref 6.1–8.1)
eGFR: 104 mL/min/{1.73_m2} (ref >=60)

## 2022-03-04 LAB — LIPID PANEL W/REFLEX DIRECT LDL
Cholesterol: 229 mg/dL — ABNORMAL HIGH (ref <200)
HDL: 52 mg/dL (ref >=50)
LDL Cholesterol (Calc): 156 mg/dL (calc) — ABNORMAL HIGH
Non-HDL Cholesterol (Calc): 177 mg/dL (calc) — ABNORMAL HIGH (ref <130)
Total CHOL/HDL Ratio: 4.4 (calc) (ref <5.0)
Triglycerides: 98 mg/dL (ref <150)

## 2022-03-04 NOTE — Progress Notes (Signed)
Hi Berlie, LDL cholesterol jumped up significantly compared to a year ago it was down to 131 it is now 156.  Just encourage you to continue to work on healthy diet and regular exercise.  Metabolic panel is okay.  The 10-year ASCVD risk score (Arnett DK, et al., 2019) is: 2.6%   Values used to calculate the score:     Age: 51 years     Sex: Female     Is Non-Hispanic African American: No     Diabetic: No     Tobacco smoker: No     Systolic Blood Pressure: 437 mmHg     Is BP treated: Yes     HDL Cholesterol: 52 mg/dL     Total Cholesterol: 229 mg/dL

## 2022-03-10 ENCOUNTER — Other Ambulatory Visit: Payer: Self-pay | Admitting: Family Medicine

## 2022-03-13 ENCOUNTER — Other Ambulatory Visit: Payer: Self-pay | Admitting: Family Medicine

## 2022-03-15 NOTE — Telephone Encounter (Signed)
Provider not at this office, will refuse this request.  Requested Prescriptions  Pending Prescriptions Disp Refills  . ipratropium (ATROVENT) 0.03 % nasal spray [Pharmacy Med Name: IPRATROPIUM 0.03% SPRAY]      Sig: SPRAY 2 SPRAYS INTO EACH NOSTRIL 3 TIMES A DAY AS NEEDED FOR RHINITIS     Off-Protocol Failed - 03/13/2022  1:33 PM      Failed - Medication not assigned to a protocol, review manually.      Passed - Valid encounter within last 12 months    Recent Outpatient Visits          2 weeks ago [place] Metheney, Rene Kocher, MD   2 months ago [place], Royetta Car, PA-C   2 months ago Cough, unspecified type   [place], MD   6 months ago LLQ pain   [facility], Purcell Nails, NP   6 months ago Hypertension, unspecified type   [facility], Rene Kocher, MD      Future Appointments            In 5 months Madilyn Fireman, Rene Kocher, MD [facility]          Off-Protocol Failed - 03/13/2022  1:33 PM      Failed - Medication not assigned to a protocol, review manually.      Passed - Valid encounter within last 12 months    Recent Outpatient Visits          2 weeks ago [place] Methe[place], Rene Kocher, MD   2 months ago [place], Royetta Car, PA-C   2 months ago Cough, unspecified type   [place], MD   6 months ago LLQ pain   [facility], NP   6 months ago Hypertension, unspecified type   [facility] [place], MD      Future  Appointments            In 5 months Metheney, Rene Kocher, MD [place]

## 2022-03-17 ENCOUNTER — Encounter: Payer: Self-pay | Admitting: Family Medicine

## 2022-03-17 ENCOUNTER — Ambulatory Visit: Payer: Self-pay

## 2022-03-17 ENCOUNTER — Ambulatory Visit: Payer: BC Managed Care – PPO | Admitting: Family Medicine

## 2022-03-17 VITALS — BP 130/80 | Ht 67.0 in | Wt 223.0 lb

## 2022-03-17 DIAGNOSIS — M25572 Pain in left ankle and joints of left foot: Secondary | ICD-10-CM

## 2022-03-17 DIAGNOSIS — M76822 Posterior tibial tendinitis, left leg: Secondary | ICD-10-CM | POA: Insufficient documentation

## 2022-03-17 DIAGNOSIS — M7662 Achilles tendinitis, left leg: Secondary | ICD-10-CM

## 2022-03-17 MED ORDER — PREDNISONE 5 MG PO TABS: ORAL_TABLET | ORAL | 0 refills | Status: DC

## 2022-03-17 NOTE — Patient Instructions (Signed)
Nice to meet you Please try ice as needed  Please try the exercises  Please use the insoles   Please send me a message in MyChart with any questions or updates.  Please see me back in 4 weeks or with Dr. Darene Lamer.   --Dr. Raeford Razor

## 2022-03-17 NOTE — Progress Notes (Signed)
  Shannon May - 51 y.o. female MRN [id number]  Date of birth: 1970-12-19  SUBJECTIVE:  Including CC & ROS.  No chief complaint on file.   Shannon May is a 51 y.o. female that is presenting with severe left-sided foot pain and swelling.   Review of Systems See HPI   HISTORY: Past Medical, Surgical, Social, and Family History Reviewed & Updated per EMR.   Pertinent Historical Findings include:  Past Medical History:  Diagnosis Date   Anxiety    Diverticulosis    Hypertension 11/08/2017   Malignant neoplasm of upper-outer quadrant of left breast in female, estrogen receptor positive ([place]) 06/08/2018   Sinusitis 11/15/2017    Past Surgical History:  Procedure Laterality Date   BREAST RECONSTRUCTION WITH PLACEMENT OF TISSUE EXPANDER AND ALLODERM N/A 08/14/2018   Procedure: BREAST RECONSTRUCTION WITH PLACEMENT OF TISSUE EXPANDER AND FLEXHD;  Surgeon: Wallace Going, DO;  Location: [place];  Service: Plastics;  Laterality: N/A;   BREAST SURGERY     DILATION AND CURETTAGE OF UTERUS     LASIK     LIPOSUCTION Bilateral 11/15/2018   Procedure: LIPOSUCTION;  Surgeon: Wallace Going, DO;  Location: [place];  Service: Plastics;  Laterality: Bilateral;   LIPOSUCTION WITH LIPOFILLING Bilateral 02/22/2019   Procedure: LIPOSUCTION WITH LIPOFILLING;  Surgeon: Wallace Going, DO;  Location: [place];  Service: Plastics;  Laterality: Bilateral;  2 hours, please   MASTECTOMY W/ SENTINEL NODE BIOPSY Bilateral 08/14/2018   with breast reconstruction   MASTECTOMY W/ SENTINEL NODE BIOPSY Bilateral 08/14/2018   Procedure: BILATERAL MASTECTOMIES  WITH LEFT SENTINEL LYMPH NODE MAPPING;  Surgeon: Jovita Kussmaul, MD;  Location: [place];  Service: General;  Laterality: Bilateral;   REMOVAL OF BILATERAL TISSUE EXPANDERS WITH PLACEMENT OF BILATERAL BREAST IMPLANTS Bilateral 11/15/2018   Procedure: removal of bilateral expanders and placement of bilateral  silicone implants.;  Surgeon: Wallace Going, DO;  Location: [place];  Service: Plastics;  Laterality: Bilateral;     PHYSICAL EXAM:  VS: BP 130/80 (BP Location: Right Arm, Patient Position: Sitting)   Ht '5\' 7"'$  (1.702 m)   Wt 223 lb (101.2 kg)   BMI 34.93 kg/m  Physical Exam Gen: NAD, alert, cooperative with exam, well-appearing MSK:  Neurovascularly intact    Limited ultrasound: Left foot:  Effusion noted of the posterior tibialis as it enters the tibial insertion at the navicular. Increased hyperemia of the mid substance Achilles as well as the insertion. No ankle effusion.  Summary: Posterior tibialis tendinitis and acute Achilles tendinitis  Ultrasound and interpretation by Clearance Coots, MD    ASSESSMENT & PLAN:   Achilles tendinitis of left lower extremity Acutely occurring when it was exacerbated with walking with flip-flops.  No tear appreciated. -Counseled on home exercise therapy and supportive care. -Green sport insoles with heel lift. -Prednisone. -Could consider cam walker.  Posterior tibial tendinitis of left lower extremity Mild effusion noted at the posterior tibialis.  She pronates of the midfoot as she weightbears. -Counseled on home exercise therapy and supportive care. -Green sport insoles with scaphoid pad. -Could consider custom orthotics.

## 2022-03-17 NOTE — Assessment & Plan Note (Signed)
Acutely occurring when it was exacerbated with walking with flip-flops.  No tear appreciated. -Counseled on home exercise therapy and supportive care. -Green sport insoles with heel lift. -Prednisone. -Could consider cam walker.

## 2022-03-17 NOTE — Assessment & Plan Note (Signed)
Mild effusion noted at the posterior tibialis.  She pronates of the midfoot as she weightbears. -Counseled on home exercise therapy and supportive care. -Green sport insoles with scaphoid pad. -Could consider custom orthotics.

## 2022-03-22 ENCOUNTER — Encounter: Payer: Self-pay | Admitting: Family Medicine

## 2022-03-23 ENCOUNTER — Ambulatory Visit: Payer: BC Managed Care – PPO | Admitting: Family Medicine

## 2022-03-23 ENCOUNTER — Encounter: Payer: Self-pay | Admitting: Family Medicine

## 2022-03-23 VITALS — BP 130/84 | Ht 67.0 in | Wt 223.0 lb

## 2022-03-23 DIAGNOSIS — M7662 Achilles tendinitis, left leg: Secondary | ICD-10-CM

## 2022-03-23 MED ORDER — MELOXICAM 15 MG PO TABS: 15.0000 mg | ORAL_TABLET | Freq: Every day | ORAL | 1 refills | Status: DC

## 2022-03-23 NOTE — Assessment & Plan Note (Signed)
Doing well.  Has completed the prednisone and has gotten better range of motion today. -Counseled on home exercise therapy and supportive care. -Provided cam walker. -Mobic. -Could consider nitro patches, shockwave, physical therapy or injection.

## 2022-03-23 NOTE — Progress Notes (Signed)
  Shannon May - 51 y.o. female MRN [id number]  Date of birth: 09-15-1970  SUBJECTIVE:  Including CC & ROS.  No chief complaint on file.   Shannon May is a 51 y.o. female that is following up for her left foot and ankle pain.  She is feeling better today following the prednisone.   Review of Systems See HPI   HISTORY: Past Medical, Surgical, Social, and Family History Reviewed & Updated per EMR.   Pertinent Historical Findings include:  Past Medical History:  Diagnosis Date   Anxiety    Diverticulosis    Hypertension 11/08/2017   Malignant neoplasm of upper-outer quadrant of left breast in female, estrogen receptor positive ([place]) 06/08/2018   Sinusitis 11/15/2017    Past Surgical History:  Procedure Laterality Date   BREAST RECONSTRUCTION WITH PLACEMENT OF TISSUE EXPANDER AND ALLODERM N/A 08/14/2018   Procedure: BREAST RECONSTRUCTION WITH PLACEMENT OF TISSUE EXPANDER AND FLEXHD;  Surgeon: Wallace Going, DO;  Location: [facility];  Service: Plastics;  Laterality: N/A;   BREAST SURGERY     DILATION AND CURETTAGE OF UTERUS     LASIK     LIPOSUCTION Bilateral 11/15/2018   Procedure: LIPOSUCTION;  Surgeon: Wallace Going, DO;  Location: [address];  Service: Plastics;  Laterality: Bilateral;   LIPOSUCTION WITH LIPOFILLING Bilateral 02/22/2019   Procedure: LIPOSUCTION WITH LIPOFILLING;  Surgeon: Wallace Going, DO;  Location: [facility];  Service: Plastics;  Laterality: Bilateral;  2 hours, please   MASTECTOMY W/ SENTINEL NODE BIOPSY Bilateral 08/14/2018   with breast reconstruction   MASTECTOMY W/ SENTINEL NODE BIOPSY Bilateral 08/14/2018   Procedure: BILATERAL MASTECTOMIES  WITH LEFT SENTINEL LYMPH NODE MAPPING;  Surgeon: Jovita Kussmaul, MD;  Location: [facility];  Service: General;  Laterality: Bilateral;   REMOVAL OF BILATERAL TISSUE EXPANDERS WITH PLACEMENT OF BILATERAL BREAST IMPLANTS Bilateral 11/15/2018   Procedure: removal of bilateral  expanders and placement of bilateral silicone implants.;  Surgeon: Wallace Going, DO;  Location: [facility];  Service: Plastics;  Laterality: Bilateral;     PHYSICAL EXAM:  VS: BP 130/84 (BP Location: Right Arm, Patient Position: Sitting)   Ht '5\' 7"'$  (1.702 m)   Wt 223 lb (101.2 kg)   BMI 34.93 kg/m  Physical Exam Gen: NAD, alert, cooperative with exam, well-appearing MSK:  Neurovascularly intact       ASSESSMENT & PLAN:   Achilles tendinitis of left lower extremity Doing well.  Has completed the prednisone and has gotten better range of motion today. -Counseled on home exercise therapy and supportive care. -Provided cam walker. -Mobic. -Could consider nitro patches, shockwave, physical therapy or injection.

## 2022-04-14 ENCOUNTER — Encounter (INDEPENDENT_AMBULATORY_CARE_PROVIDER_SITE_OTHER): Payer: Self-pay

## 2022-04-14 ENCOUNTER — Ambulatory Visit: Payer: BC Managed Care – PPO | Admitting: Sports Medicine

## 2022-04-14 DIAGNOSIS — M7662 Achilles tendinitis, left leg: Secondary | ICD-10-CM | POA: Diagnosis not present

## 2022-04-14 MED ORDER — NITROGLYCERIN 0.2 MG/HR TD PT24: MEDICATED_PATCH | TRANSDERMAL | 11 refills | Status: DC

## 2022-04-14 NOTE — Progress Notes (Signed)
    Procedures performed today:    None.  Independent interpretation of notes and tests performed by another provider:   None.  Brief History, Exam, Impression, and Recommendations:    Achilles tendinitis of left lower extremity Shannon May returns, she is a pleasant 51 year old female, she had seen Dr. 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 Meloxicam. She still has some discomfort at the Achilles insertion, tibialis posterior is asymptomatic now. Continue treatment prescribed by Dr. Raeford Razor, I will add some scaphoid pads and heel lifts for her to take home, and ongoing to add additional eccentric conditioning for her Achilles. Adding topical nitroglycerin patches per her request, return to see me in 6 weeks as needed.  Chronic process not at goal with pharmacologic intervention  ____________________________________________ Gwen Her. Dianah Field, M.D., ABFM., CAQSM., AME. Primary Care and Sports Medicine [facility]  Adjunct Professor of [place] [employer] [facility] of Medicine  Risk manager

## 2022-04-14 NOTE — Patient Instructions (Signed)
Achilles eccentric conditioning: Begin with easy walking, heel, toe and backwards  Do calf raises on a step, always lower very slowly and then raise at a normal speed:  First lower and then raise on 1 foot  If this is painful, lower on 1 foot, do the heel raise on both feet Begin with 3 sets of 10 repetitions  Increase by 5 repetitions every 3 days  Goal is 3 sets of 30 repetitions  Do with both straight knee and knee at 20 degrees of flexion  If pain persists, once you can do 3 sets of 30 without weight, add backpack with 5 lbs.  Increase by 5 lbs per week to max of 30 lbs for 3 sets of 15

## 2022-04-14 NOTE — Assessment & Plan Note (Signed)
Shannon May returns, she is a pleasant 51 year old female, she had seen Dr. 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 Meloxicam. She still has some discomfort at the Achilles insertion, tibialis posterior is asymptomatic now. Continue treatment prescribed by Dr. Raeford Razor, I will add some scaphoid pads and heel lifts for her to take home, and ongoing to add additional eccentric conditioning for her Achilles. Adding topical nitroglycerin patches per her request, return to see me in 6 weeks as needed.

## 2022-05-27 ENCOUNTER — Ambulatory Visit: Payer: BC Managed Care – PPO | Admitting: Sports Medicine

## 2022-05-27 ENCOUNTER — Ambulatory Visit (INDEPENDENT_AMBULATORY_CARE_PROVIDER_SITE_OTHER): Payer: BC Managed Care – PPO

## 2022-05-27 DIAGNOSIS — M25871 Other specified joint disorders, right ankle and foot: Secondary | ICD-10-CM | POA: Diagnosis not present

## 2022-05-27 DIAGNOSIS — M19071 Primary osteoarthritis, right ankle and foot: Secondary | ICD-10-CM | POA: Insufficient documentation

## 2022-05-27 DIAGNOSIS — M7662 Achilles tendinitis, left leg: Secondary | ICD-10-CM

## 2022-05-27 NOTE — Progress Notes (Signed)
    Procedures performed today:    None.  Independent interpretation of notes and tests performed by another provider:   None.  Brief History, Exam, Impression, and Recommendations:    Sesamoiditis of right foot Pleasant 51 year old female, she has started to have pain plantar aspect right foot first MTP. On exam she has tenderness at the lateral sesamoid. Suspect sesamoiditis, we added dancers pads to her sports insoles, we will get some x-rays, return to see me in 6 weeks, MRI if no better.  Achilles tendinitis of left lower extremity Doing much better, pain from Achilles tendinosis is essentially resolved with Achilles rehab, topical nitroglycerin, heel lifts and home physical therapy. Okay to discontinue topical nitro.    ____________________________________________ Gwen Her. Dianah Field, M.D., ABFM., CAQSM., AME. Primary Care and Sports Medicine [facility]  Adjunct Professor of [place] [employer] [facility] of Medicine  Risk manager

## 2022-05-27 NOTE — Assessment & Plan Note (Signed)
Pleasant 51 year old female, she has started to have pain plantar aspect right foot first MTP. On exam she has tenderness at the lateral sesamoid. Suspect sesamoiditis, we added dancers pads to her sports insoles, we will get some x-rays, return to see me in 6 weeks, MRI if no better.

## 2022-05-27 NOTE — Assessment & Plan Note (Signed)
Doing much better, pain from Achilles tendinosis is essentially resolved with Achilles rehab, topical nitroglycerin, heel lifts and home physical therapy. Okay to discontinue topical nitro.

## 2022-06-25 ENCOUNTER — Telehealth: Payer: Self-pay | Admitting: *Deleted

## 2022-06-25 NOTE — Telephone Encounter (Addendum)
Received on (06/10/22) via of fax DME Standard Written Order from [facility].  Requesting signature and return  Given to provider to sign.    Standard Written Order signed and faxed back to [facility].  Confirmation received and copy scanned into the chart.//AB/CMA

## 2022-07-02 ENCOUNTER — Other Ambulatory Visit: Payer: Self-pay | Admitting: Family Medicine

## 2022-07-06 ENCOUNTER — Telehealth: Payer: Self-pay

## 2022-07-06 NOTE — Telephone Encounter (Signed)
Faxed signed DME request to [facility] with confirmed receipt.

## 2022-07-08 ENCOUNTER — Ambulatory Visit (INDEPENDENT_AMBULATORY_CARE_PROVIDER_SITE_OTHER): Payer: BC Managed Care – PPO | Admitting: Sports Medicine

## 2022-07-08 ENCOUNTER — Encounter: Payer: Self-pay | Admitting: Sports Medicine

## 2022-07-08 VITALS — Ht 67.0 in | Wt 233.0 lb

## 2022-07-08 DIAGNOSIS — M25871 Other specified joint disorders, right ankle and foot: Secondary | ICD-10-CM

## 2022-07-08 DIAGNOSIS — Z23 Encounter for immunization: Secondary | ICD-10-CM | POA: Diagnosis not present

## 2022-07-08 DIAGNOSIS — M7662 Achilles tendinitis, left leg: Secondary | ICD-10-CM

## 2022-07-08 MED ORDER — TRAMADOL HCL 50 MG PO TABS: 50.0000 mg | ORAL_TABLET | Freq: Three times a day (TID) | ORAL | 0 refills | Status: DC | PRN

## 2022-07-08 NOTE — Assessment & Plan Note (Signed)
Historically was doing better but now with worsening pain left Achilles insertion. At this point I would consider her to have failed topical nitroglycerin, Achilles rehab, heel lifts, the next step would be a retrocalcaneal bursa injection, she does have a conference to go to, so we are looking at the injection next week or the week after followed by 1 week of boot immobilization. Tramadol for pain relief in the meantime.

## 2022-07-08 NOTE — Progress Notes (Signed)
    Procedures performed today:    None.  Independent interpretation of notes and tests performed by another provider:   None.  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 She has been wearing her dancers pad and green sports insoles from Dr. Raeford Razor. Unfortunately no improvement. Oral NSAIDs not improving her pain. She has failed greater than 6 weeks of conservative treatment so we will proceed with an MRI, I suspect we will be doing an MTP injection if first MTP osteoarthritis is all that is noted, versus a subsesamoid injection if we see more T2 edema in the sesamoids. Return to see me in a week or 2 to go over MRI results.  Achilles tendinitis of left lower extremity Historically was doing better but now with worsening pain left Achilles insertion. At this point I would consider her to have failed topical nitroglycerin, Achilles rehab, heel lifts, the next step would be a retrocalcaneal bursa injection, she does have a conference to go to, so we are looking at the injection next week or the week after followed by 1 week of boot immobilization. Tramadol for pain relief in the meantime.  Chronic process not at goal with pharmacologic intervention  ____________________________________________ Gwen Her. Dianah Field, M.D., ABFM., CAQSM., AME. Primary Care and Sports Medicine [facility]  Adjunct Professor of [place] [employer] [facility] of Medicine  Risk manager

## 2022-07-08 NOTE — Assessment & Plan Note (Signed)
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 She has been wearing her dancers pad and green sports insoles from Dr. Raeford Razor. Unfortunately no improvement. Oral NSAIDs not improving her pain. She has failed greater than 6 weeks of conservative treatment so we will proceed with an MRI, I suspect we will be doing an MTP injection if first MTP osteoarthritis is all that is noted, versus a subsesamoid injection if we see more T2 edema in the sesamoids. Return to see me in a week or 2 to go over MRI results.

## 2022-07-13 ENCOUNTER — Ambulatory Visit (INDEPENDENT_AMBULATORY_CARE_PROVIDER_SITE_OTHER): Payer: BC Managed Care – PPO

## 2022-07-13 ENCOUNTER — Ambulatory Visit: Payer: BC Managed Care – PPO | Admitting: Plastic Surgery

## 2022-07-13 DIAGNOSIS — M79671 Pain in right foot: Secondary | ICD-10-CM | POA: Diagnosis not present

## 2022-07-13 DIAGNOSIS — M25871 Other specified joint disorders, right ankle and foot: Secondary | ICD-10-CM

## 2022-07-13 DIAGNOSIS — G8929 Other chronic pain: Secondary | ICD-10-CM | POA: Diagnosis not present

## 2022-08-03 ENCOUNTER — Ambulatory Visit: Payer: BC Managed Care – PPO | Admitting: Sports Medicine

## 2022-08-03 ENCOUNTER — Ambulatory Visit (INDEPENDENT_AMBULATORY_CARE_PROVIDER_SITE_OTHER): Payer: BC Managed Care – PPO

## 2022-08-03 ENCOUNTER — Encounter: Payer: Self-pay | Admitting: Sports Medicine

## 2022-08-03 VITALS — BP 121/77 | HR 70 | Ht 67.0 in | Wt 233.0 lb

## 2022-08-03 DIAGNOSIS — M19071 Primary osteoarthritis, right ankle and foot: Secondary | ICD-10-CM

## 2022-08-03 DIAGNOSIS — M7662 Achilles tendinitis, left leg: Secondary | ICD-10-CM | POA: Diagnosis not present

## 2022-08-03 MED ORDER — TRIAMCINOLONE ACETONIDE 40 MG/ML IJ SUSP
40.0000 mg | Freq: Once | INTRAMUSCULAR | Status: AC
  Administered 2022-08-03: 40 mg via INTRAMUSCULAR

## 2022-08-03 MED ORDER — TRIAMCINOLONE ACETONIDE 40 MG/ML IJ SUSP: 40.0000 mg | Freq: Once | INTRAMUSCULAR | Status: DC

## 2022-08-03 NOTE — Assessment & Plan Note (Signed)
Also has insertional Achilles tendinosis left side, unfortunately she has failed topical nitroglycerin, Achilles rehab, heel lifts, we did a retrocalcaneal bursa injection today, she will keep the boot on for a week and return to see me in 6 weeks. We did discuss the importance of weight loss, she will work aggressively on this with her PCP.

## 2022-08-03 NOTE — Assessment & Plan Note (Signed)
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 Return to see me in 6 weeks.

## 2022-08-03 NOTE — Progress Notes (Signed)
    Procedures performed today:    Procedure: Real-time Ultrasound Guided injection of the right first MTP Device: Samsung HS60  Verbal informed consent obtained.  Time-out conducted.  Noted no overlying erythema, induration, or other signs of local infection.  Skin prepped in a sterile fashion.  Local anesthesia: Topical Ethyl chloride.  With sterile technique and under real time ultrasound guidance: Noted joint space narrowing, 1/2 cc lidocaine, 1/2 cc kenalog 40 injected easily. Completed without difficulty  Advised to call if fevers/chills, erythema, induration, drainage, or persistent bleeding.  Images permanently stored and available for review in PACS.  Impression: Technically successful ultrasound guided injection.  Procedure: Real-time Ultrasound Guided injection of the left retrocalcaneal bursa Device: Samsung HS60  Verbal informed consent obtained.  Time-out conducted.  Noted no overlying erythema, induration, or other signs of local infection.  Skin prepped in a sterile fashion.  Local anesthesia: Topical Ethyl chloride.  With sterile technique and under real time ultrasound guidance: Noted mild bursitis, 1/2 cc lidocaine, 1/2 cc kenalog 40 injected easily. Advised to call if fevers/chills, erythema, induration, drainage, or persistent bleeding.  Images permanently stored and available for review in PACS.  Impression: Technically successful ultrasound guided injection.  Independent interpretation of notes and tests performed by another provider:   None.  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 Return to see me in 6  weeks.  Achilles tendinitis of left lower extremity Also has insertional Achilles tendinosis left side, unfortunately she has failed topical nitroglycerin, Achilles rehab, heel lifts, we did a retrocalcaneal bursa injection today, she will keep the boot on for a week and return to see me in 6 weeks. We did discuss the importance of weight loss, she will work aggressively on this with her PCP.    ____________________________________________ Gwen Her. Dianah Field, M.D., ABFM., CAQSM., AME. Primary Care and Sports Medicine [facility]  Adjunct Professor of [place] of [facility] of Medicine  Risk manager

## 2022-08-18 ENCOUNTER — Other Ambulatory Visit: Payer: Self-pay | Admitting: Family Medicine

## 2022-08-24 ENCOUNTER — Ambulatory Visit: Payer: BC Managed Care – PPO | Admitting: Surgical

## 2022-08-24 ENCOUNTER — Encounter: Payer: Self-pay | Admitting: Surgical

## 2022-08-24 ENCOUNTER — Ambulatory Visit: Payer: BC Managed Care – PPO | Admitting: Plastic Surgery

## 2022-08-24 DIAGNOSIS — Z9013 Acquired absence of bilateral breasts and nipples: Secondary | ICD-10-CM

## 2022-08-24 DIAGNOSIS — D0512 Intraductal carcinoma in situ of left breast: Secondary | ICD-10-CM

## 2022-08-24 NOTE — Progress Notes (Signed)
   Referring Provider Hali Marry, MD [address] [place],  [place] [address]   CC:  Chief Complaint  Patient presents with   Follow-up      Shannon May is an 51 y.o. female.  HPI: The patient is a 51 year old female here for follow-up on her bilateral breast reconstruction.  She underwent bilateral mastectomy and bilateral breast reconstruction placement of tissue expanders December 2019.  She then had removal of bilateral breast tissue expanders and placement of silicone breast implants March 2020.  She then had fat grafting in June 2020.  She had [facility] smooth round high-profile 590 cc gel implants placed bilaterally.  She has also had nipple areola tattooing at an outside facility.  She reports that it has faded since her tattoo and she would like a touchup here in our office.  She reports she would like to go slightly darker with the pigment.  She has not had any imaging since her bilateral mastectomy and reconstruction.  She is aware that the recommendations are ultrasound or MRI at 5 to 6 years after placement of the implants to evaluate implant integrity.  She reports she has not had any issues with the implants, no pain, tightness or other concerns.  Review of Systems General: No fevers or chills  Physical Exam    08/03/2022    9:15 AM 07/08/2022    8:40 AM 03/23/2022    1:56 PM  Vitals with BMI  Height '5\' 7"'$  '5\' 7"'$  '5\' 7"'$   Weight 233 lbs 233 lbs 223 lbs  BMI 36.48 73.41 93.79  Systolic 024  097  Diastolic 77  84  Pulse 70      General:  No acute distress,  Alert and oriented, Non-Toxic, Normal speech and affect Bilateral breast: Bilateral mastectomy incisions are well-healed, bilateral nipple areola tattoos are present, slightly faded.  No mastectomy skin changes noted.  No subcutaneous fluid collections noted.  No erythema or cellulitic changes noted.  No masses noted.  No tenderness noted with palpation.  Assessment/Plan Patient is a  51 year old female status post bilateral mastectomy and bilateral breast reconstruction placement tissue expanders December 2019, had bilateral breast implants placed March 2020.  She is approximately 4 years postop.  She is doing well, has not noticed any changes or concerns to bilateral breast.  We discussed [facility] recommendations for initial imaging is 5 to 6 years with MRI or ultrasound.  We discussed option for ordering MRI this year or next year, she would like to wait until next year and I  think this is fine given the timeline.  We discussed recommendation for imaging if she develops any skin changes, shape changes or pain.  We discussed nipple areola tattooing, she would like to go darker.  I discussed with her that we can accommodate that and plan to tattoo the nipple areola bilaterally early next year.  Pictures were obtained of the patient and placed in the chart with the patient's or guardian's permission.   Carola Rhine Sheryle Vice 08/24/2022, 2:48 PM

## 2022-08-29 ENCOUNTER — Encounter: Payer: Self-pay | Admitting: Emergency Medicine

## 2022-08-29 ENCOUNTER — Ambulatory Visit
Admission: EM | Admit: 2022-08-29 | Discharge: 2022-08-29 | Disposition: A | Discharge status: Home / Self Care | Payer: BC Managed Care – PPO | Attending: Family Medicine | Admitting: Family Medicine

## 2022-08-29 DIAGNOSIS — J209 Acute bronchitis, unspecified: Secondary | ICD-10-CM | POA: Diagnosis not present

## 2022-08-29 MED ORDER — PROMETHAZINE-DM 6.25-15 MG/5ML PO SYRP: 5.0000 mL | ORAL_SOLUTION | Freq: Four times a day (QID) | ORAL | 0 refills | Status: DC | PRN

## 2022-08-29 NOTE — ED Triage Notes (Signed)
Negative for flu & covid per pt on Wed at [place] have RSV Pt is on prednisone, tessalon perles , dayquil & nyquil

## 2022-08-29 NOTE — ED Provider Notes (Signed)
Shannon May CARE    CSN: [id number] Arrival date & time: 08/29/22  1540      History   Chief Complaint Chief Complaint  Patient presents with   Cough    HPI Shannon May is a 51 y.o. female.   HPI  Patient has been sick for 8 days.  Was exposed to grandchildren with RSV.  She is also a Pharmacist, hospital.  She states that she went to [facility] on the fourth day of illness.  They did flu and COVID testing.  They are both negative.  She was given Tessalon and prednisone.  She is on these medications.  She continues to have cough and symptoms.  She states she spoke with her sister who is a PA.  The sister called in doxycycline.  Also an albuterol inhaler.  She is here because she has noticed a rattling noise in her chest and she feels like she may have pneumonia.  Past Medical History:  Diagnosis Date   Anxiety    Diverticulosis    Hypertension 11/08/2017   Malignant neoplasm of upper-outer quadrant of left breast in female, estrogen receptor positive ([place]) 06/08/2018   Sinusitis 11/15/2017    Patient Active Problem List   Diagnosis Date Noted   Arthritis of first metatarsophalangeal (MTP) joint of right foot 05/27/2022   Posterior tibial tendinitis of left lower extremity 03/17/2022   Achilles tendinitis of left lower extremity 03/17/2022   Hyperlipidemia 02/25/2022   Post-COVID syndrome 07/03/2020   Depression, major, single episode, mild ([place]) 06/06/2020   Stress 06/08/2019   S/P breast reconstruction, bilateral 12/22/2018   S/P mastectomy, bilateral 09/15/2018   Acquired absence of breast 08/22/2018   Breast cancer ([place]) 08/14/2018   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   Symptomatic mammary hypertrophy 06/08/2018   Malignant neoplasm of upper-outer quadrant of left breast in female, estrogen receptor positive ([place]) 06/08/2018   Nodule of ear canal, bilateral 11/15/2017   Hypertension 11/08/2017   Venous stasis 05/13/2016   Lumbar radiculitis  05/23/2014   Left subacromial bursitis 11/30/2012   DERMATITIS, 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/06/2010   HEADACHE 04/06/2010   HEEL PAIN, RIGHT 03/12/2009   ALLERGIC RHINITIS 05/09/2008    Past Surgical History:  Procedure Laterality Date   BREAST RECONSTRUCTION WITH PLACEMENT OF TISSUE EXPANDER AND ALLODERM N/A 08/14/2018   Procedure: BREAST RECONSTRUCTION WITH PLACEMENT OF TISSUE EXPANDER AND FLEXHD;  Surgeon: Wallace Going, DO;  Location: [place];  Service: Plastics;  Laterality: N/A;   BREAST SURGERY     DILATION AND CURETTAGE OF UTERUS     LASIK     LIPOSUCTION Bilateral 11/15/2018   Procedure: LIPOSUCTION;  Surgeon: Wallace Going, DO;  Location: [place];  Service: Plastics;  Laterality: Bilateral;   LIPOSUCTION WITH LIPOFILLING Bilateral 02/22/2019   Procedure: LIPOSUCTION WITH LIPOFILLING;  Surgeon: Wallace Going, DO;  Location: [place];  Service: Plastics;  Laterality: Bilateral;  2 hours, please   MASTECTOMY W/ SENTINEL NODE BIOPSY Bilateral 08/14/2018   with breast reconstruction   MASTECTOMY W/ SENTINEL NODE BIOPSY Bilateral 08/14/2018   Procedure: BILATERAL MASTECTOMIES  WITH LEFT SENTINEL LYMPH NODE MAPPING;  Surgeon: Jovita Kussmaul, MD;  Location: [place];  Service: General;  Laterality: Bilateral;   REMOVAL OF BILATERAL TISSUE EXPANDERS WITH PLACEMENT OF BILATERAL BREAST IMPLANTS Bilateral 11/15/2018   Procedure: removal of bilateral expanders and placement of bilateral silicone implants.;  Surgeon: Wallace Going, DO;  Location: [place];  Service: Clinical cytogeneticist;  Laterality: Bilateral;    OB History     Gravida  5   Para  3   Term      Preterm      AB  2   Living  3      SAB  1   IAB  1   Ectopic      Multiple      Live Births               Home Medications    Prior to Admission medications   Medication Sig Start Date End Date Taking? Authorizing Provider  albuterol (VENTOLIN HFA) 108 (90  Base) MCG/ACT inhaler Inhale into the lungs. 08/29/22  Yes [provider]  benzonatate (TESSALON) 200 MG capsule Take 400 mg by mouth every 8 (eight) hours as needed. 08/25/22  Yes [provider]  doxycycline (VIBRAMYCIN) 100 MG capsule Take 100 mg by mouth 2 (two) times daily. 08/29/22  Yes [provider]  nitroGLYCERIN (NITRODUR - DOSED IN MG/24 HR) 0.2 mg/hr patch SMARTSIG:0.25 Patch(s) Topical Daily 08/17/22  Yes [provider]  predniSONE (DELTASONE) 10 MG tablet Take 30 mg by mouth daily. 08/25/22  Yes [provider]  promethazine-dextromethorphan (PROMETHAZINE-DM) 6.25-15 MG/5ML syrup Take 5 mLs by mouth 4 (four) times daily as needed for cough. 08/29/22  Yes Raylene Everts, MD  acetaminophen (TYLENOL) 500 MG tablet Take 1,000 mg by mouth every 6 (six) hours as needed for moderate pain.    [provider]  clobetasol (TEMOVATE) 0.05 % external solution Apply 1 application. topically daily. 12/19/21   [provider]  Docusate Calcium (STOOL SOFTENER PO) Take 1 tablet by mouth 2 (two) times daily.     [provider]  escitalopram (LEXAPRO) 10 MG tablet TAKE 1 TABLET BY MOUTH EVERY DAY 03/10/22   Hali Marry, MD  estradiol (ESTRACE) 0.1 MG/GM vaginal cream SMARTSIG:1 Applicator Vaginal Daily 12/29/21   [provider]  fluticasone (FLONASE) 50 MCG/ACT nasal spray PLACE 2 SPRAYS INTO THE NOSE DAILY Patient taking differently: Place 2 sprays into both nostrils daily. 04/24/14   Breeback, Jade L, PA-C  hydrochlorothiazide (MICROZIDE) 12.5 MG capsule TAKE 1 CAPSULE BY MOUTH EVERY MORNING 03/01/22   Hali Marry, MD  ibuprofen (ADVIL) 200 MG tablet Take 200 mg by mouth every 6 (six) hours as needed for moderate pain.    [provider]  losartan (COZAAR) 50 MG tablet TAKE 1 TABLET BY MOUTH EVERYDAY AT BEDTIME 03/01/22   Hali Marry, MD  meloxicam (MOBIC) 15 MG tablet TAKE 1 TABLET  (15 MG TOTAL) BY MOUTH DAILY. 08/18/22   Rosemarie Ax, MD  Multiple Vitamins-Minerals (MULTIVITAMIN WOMEN PO) Take 1 capsule by mouth daily.    [provider]  Probiotic Product (PRO-BIOTIC BLEND PO) Take 1 tablet by mouth daily.     [provider]    Family History Family History  Problem Relation Age of Onset   Hypertension Mother    Hyperlipidemia Father    Heart attack Maternal Grandmother    Colon cancer Maternal Grandfather    Melanoma Paternal Grandfather    Vaginal cancer Maternal Aunt     Social History Social History   Tobacco Use   Smoking status: Former    Types: Cigarettes    Quit date: 06/13/1988    Years since quitting: 34.2   Smokeless tobacco: Never   Tobacco comments:    She smoked a couple of months in  her teens.   Vaping Use   Vaping Use: Never used  Substance Use Topics   Alcohol use: Yes    Alcohol/week: 3.0 standard drinks of alcohol    Types: 3 Glasses of wine per week    Comment: weekly   Drug use: No     Allergies   Antihistamines, chlorpheniramine-type; Sudafed [pseudoephedrine hcl]; Sulfamethoxazole; and Erythromycin   Review of Systems Review of Systems See HPI  Physical Exam Triage Vital Signs ED Triage Vitals  Enc Vitals Group     BP 08/29/22 1609 128/84     Pulse Rate 08/29/22 1609 68     Resp 08/29/22 1609 18     Temp 08/29/22 1609 99.3 F (37.4 C)     Temp Source 08/29/22 1609 Oral     SpO2 08/29/22 1609 97 %     Weight 08/29/22 1610 220 lb (99.8 kg)     Height 08/29/22 1610 '5\' 7"'$  (1.702 m)     Head Circumference --      Peak Flow --      Pain Score 08/29/22 1609 4     Pain Loc --      Pain Edu? --      Excl. in [place]? --    No data found.  Updated Vital Signs BP 128/84 (BP Location: Right Arm)   Pulse 68   Temp 99.3 F (37.4 C) (Oral)   Resp 18   Ht '5\' 7"'$  (1.702 m)   Wt 99.8 kg   SpO2 97%   BMI 34.46 kg/m      Physical Exam Constitutional:      General: She is not in acute  distress.    Appearance: She is well-developed. She is ill-appearing.  HENT:     Head: Normocephalic and atraumatic.     Nose: Congestion and rhinorrhea present.     Mouth/Throat:     Pharynx: No posterior oropharyngeal erythema.  Eyes:     Conjunctiva/sclera: Conjunctivae normal.     Pupils: Pupils are equal, round, and reactive to light.  Cardiovascular:     Rate and Rhythm: Normal rate and regular rhythm.     Heart sounds: Normal heart sounds.  Pulmonary:     Effort: Pulmonary effort is normal. No respiratory distress.     Breath sounds: Rhonchi present.     Comments: Has coarse expiratory rhonchi without rales Abdominal:     General: There is no distension.     Palpations: Abdomen is soft.  Musculoskeletal:        General: Normal range of motion.     Cervical back: Normal range of motion and neck supple.  Lymphadenopathy:     Cervical: No cervical adenopathy.  Skin:    General: Skin is warm and dry.  Neurological:     Mental Status: She is alert.  Psychiatric:        Mood and Affect: Mood normal.        Behavior: Behavior normal.      UC Treatments / Results  Labs (all labs ordered are listed, but only abnormal results are displayed) Labs Reviewed - No data to display  EKG   Radiology No results found.  Procedures Procedures (including critical care time)  Medications Ordered in UC Medications - No data to display  Initial Impression / Assessment and Plan / UC Course  I have reviewed the triage vital signs and the nursing notes.  Pertinent labs & imaging results that were available during my care of the  patient were reviewed by me and considered in my medical decision making (see chart for details).     We discussed viral bronchitis.  It can cause all of the symptoms.  Because her symptoms are worse and she has pain with deep breath and coughing to give him a stronger cough medicine.  She is already on everything she needs to improve.  Needs to give it  time. Final Clinical Impressions(s) / UC Diagnoses   Final diagnoses:  Acute bronchitis, unspecified organism     Discharge Instructions      Make sure you are drinking lots of fluids Run a humidifier Continue with your prednisone Take the doxycycline 2 times a day with food Use the inhaler as needed I have prescribed a stronger cough medicine for as needed use.  This can cause drowsiness.  It is good to take at bedtime May take ibuprofen 600 to 800 mg 3 times a day for the rib pain See your doctor if not improving by next week.  This cough may persist longer than 4 weeks   ED Prescriptions     Medication Sig Dispense Auth. Provider   promethazine-dextromethorphan (PROMETHAZINE-DM) 6.25-15 MG/5ML syrup Take 5 mLs by mouth 4 (four) times daily as needed for cough. 118 mL Raylene Everts, MD      PDMP not reviewed this encounter.   Raylene Everts, MD 08/29/22 [phone]

## 2022-08-29 NOTE — Discharge Instructions (Signed)
Make sure you are drinking lots of fluids Run a humidifier Continue with your prednisone Take the doxycycline 2 times a day with food Use the inhaler as needed I have prescribed a stronger cough medicine for as needed use.  This can cause drowsiness.  It is good to take at bedtime May take ibuprofen 600 to 800 mg 3 times a day for the rib pain See your doctor if not improving by next week.  This cough may persist longer than 4 weeks

## 2022-09-08 ENCOUNTER — Other Ambulatory Visit: Payer: Self-pay | Admitting: Family Medicine

## 2022-09-08 ENCOUNTER — Ambulatory Visit: Payer: BC Managed Care – PPO | Admitting: Family Medicine

## 2022-09-08 ENCOUNTER — Encounter: Payer: Self-pay | Admitting: Family Medicine

## 2022-09-08 VITALS — BP 114/41 | HR 62 | Ht 67.0 in | Wt 231.0 lb

## 2022-09-08 DIAGNOSIS — I10 Essential (primary) hypertension: Secondary | ICD-10-CM

## 2022-09-08 DIAGNOSIS — R7309 Other abnormal glucose: Secondary | ICD-10-CM | POA: Diagnosis not present

## 2022-09-08 DIAGNOSIS — Z7689 Persons encountering health services in other specified circumstances: Secondary | ICD-10-CM

## 2022-09-08 NOTE — Assessment & Plan Note (Signed)
Visit #: 1 Starting Weight: 231 lbs   Current weight: 231 lbs  Previous weight: 233 lbs  Change in weight: down 2 lbs  Goal weight: Dietary goals: Encouraged her to start tracking food and caloric intake using one of the smart phone app such as my fitness pal or lose it. Exercise goals: Set small goals may be vigil starting with some stretching especially until she feels better in regards to her bronchitis and her Achilles tendinitis. Medication: We did discuss the possibility of GLP-1's and how they work.  Really encouraged her to get back on track diet and exercise first. Follow-up and referrals: 2 months.

## 2022-09-08 NOTE — Progress Notes (Signed)
Established Patient Office Visit  Subjective   Patient ID: Shannon May, female    DOB: [birth year]  Age: 52 y.o. MRN: [id number]  Chief Complaint  Patient presents with   Hypertension    HPI  Hypertension- Pt denies chest pain, SOB, dizziness, or heart palpitations.  Taking meds as directed w/o problems.  Denies medication side effects.  He was seen in urgent care for bronchitis about 2 weeks ago and treated with doxycycline, prednisone, cough syrup and Tessalon Perles.  She says she is feeling mostly better but still has a little bit of a persistent cough.  She also wants to discuss her weight she says she has never been this heavy before.  She is down a couple pounds since November.  Potentially interested in the GLP-1 class of medications.  Gradually gaining weight since she went into menopause.  Has not been able to exercise as regularly because she has been dealing with Achilles tendinitis on her left foot.  Is getting much better.  She has been wearing good supportive shoe wear and has had injections in both feet recently.     ROS    Objective:     BP (!) 114/41   Pulse 62   Ht '5\' 7"'$  (1.702 m)   Wt 231 lb (104.8 kg)   SpO2 98%   BMI 36.18 kg/m    Physical Exam Vitals and nursing note reviewed.  Constitutional:      Appearance: She is well-developed.  HENT:     Head: Normocephalic and atraumatic.  Cardiovascular:     Rate and Rhythm: Normal rate and regular rhythm.     Heart sounds: Normal heart sounds.  Pulmonary:     Effort: Pulmonary effort is normal.     Breath sounds: Normal breath sounds.  Skin:    General: Skin is warm and dry.  Neurological:     Mental Status: She is alert and oriented to person, place, and time.  Psychiatric:        Behavior: Behavior normal.     No results found for any visits on 09/08/22.    The 10-year ASCVD risk score (Arnett DK, et al., 2019) is: 2.2%    Assessment & Plan:   Problem List Items Addressed This  Visit       Cardiovascular and Mediastinum   Hypertension - Primary    Blood pressure looks absolutely fantastic today.  Will check BMP today.  Follow-up in 6 months.      Relevant Orders   BASIC METABOLIC PANEL WITH GFR   Hemoglobin A1c     Other   Encounter for weight management    Visit #: 1 Starting Weight: 231 lbs   Current weight: 231 lbs  Previous weight: 233 lbs  Change in weight: down 2 lbs  Goal weight: Dietary goals: Encouraged her to start tracking food and caloric intake using one of the smart phone app such as my fitness pal or lose it. Exercise goals: Set small goals may be vigil starting with some stretching especially until she feels better in regards to her bronchitis and her Achilles tendinitis. Medication: We did discuss the possibility of GLP-1's and how they work.  Really encouraged her to get back on track diet and exercise first. Follow-up and referrals: 2 months.       Other Visit Diagnoses     Abnormal glucose       Relevant Orders   Hemoglobin A1c      Abnormal glucose-we  will check A1c.  It has been a few years since we have screen for diabetes.  Return in about 6 months (around 03/09/2023) for Hypertension.    Beatrice Lecher, MD

## 2022-09-08 NOTE — Assessment & Plan Note (Signed)
Blood pressure looks absolutely fantastic today.  Will check BMP today.  Follow-up in 6 months.

## 2022-09-09 LAB — BASIC METABOLIC PANEL WITH GFR
BUN: 14 mg/dL (ref 7–25)
CO2: 28 mmol/L (ref 20–32)
Calcium: 9.1 mg/dL (ref 8.6–10.4)
Chloride: 102 mmol/L (ref 98–110)
Creat: 0.68 mg/dL (ref 0.50–1.03)
Glucose, Bld: 100 mg/dL — ABNORMAL HIGH (ref 65–99)
Potassium: 3.9 mmol/L (ref 3.5–5.3)
Sodium: 140 mmol/L (ref 135–146)
eGFR: 104 mL/min/{1.73_m2} (ref >=60)

## 2022-09-09 LAB — HEMOGLOBIN A1C
Hgb A1c MFr Bld: 6.1 % of total Hgb — ABNORMAL HIGH (ref <5.7)
Mean Plasma Glucose: 128 mg/dL
eAG (mmol/L): 7.1 mmol/L

## 2022-09-09 NOTE — Progress Notes (Signed)
Shannon May, your A1c is an average of your blood sugar levels over the last 2 to 3 months.  Your level is at 6.1 which is in the prediabetes range.  6.5 or higher by definition his diabetes.  So I really want you to work on healthy food choices and cutting back on sweets and carbs and getting regular exercise as you are able to.  And we will plan to recheck your A1c in 3 to 4 months to make sure that it is going down.  Your metabolic panel is normal.

## 2022-09-14 ENCOUNTER — Ambulatory Visit (INDEPENDENT_AMBULATORY_CARE_PROVIDER_SITE_OTHER): Payer: BC Managed Care – PPO

## 2022-09-14 ENCOUNTER — Encounter: Payer: Self-pay | Admitting: Sports Medicine

## 2022-09-14 ENCOUNTER — Ambulatory Visit (INDEPENDENT_AMBULATORY_CARE_PROVIDER_SITE_OTHER): Payer: BC Managed Care – PPO | Admitting: Sports Medicine

## 2022-09-14 DIAGNOSIS — M7662 Achilles tendinitis, left leg: Secondary | ICD-10-CM | POA: Diagnosis not present

## 2022-09-14 DIAGNOSIS — M19071 Primary osteoarthritis, right ankle and foot: Secondary | ICD-10-CM

## 2022-09-14 DIAGNOSIS — R052 Subacute cough: Secondary | ICD-10-CM

## 2022-09-14 DIAGNOSIS — R059 Cough, unspecified: Secondary | ICD-10-CM | POA: Insufficient documentation

## 2022-09-14 DIAGNOSIS — R053 Chronic cough: Secondary | ICD-10-CM | POA: Diagnosis not present

## 2022-09-14 MED ORDER — TRIAMCINOLONE ACETONIDE 55 MCG/ACT NA AERO: 2.0000 | INHALATION_SPRAY | Freq: Every day | NASAL | 11 refills | Status: DC

## 2022-09-14 MED ORDER — HYDROCOD POLI-CHLORPHE POLI ER 10-8 MG/5ML PO SUER: 5.0000 mL | Freq: Two times a day (BID) | ORAL | 0 refills | Status: DC | PRN

## 2022-09-14 NOTE — Addendum Note (Signed)
Addended by: Silverio Decamp on: 09/14/2022 02:52 PM   Modules accepted: Orders

## 2022-09-14 NOTE — Assessment & Plan Note (Signed)
Resolved after injection at the last visit 

## 2022-09-14 NOTE — Assessment & Plan Note (Signed)
Shannon May has had over a month of cough after a mild viral illness that was not COVID or flu. She has antibiotics, steroids, inhalers, Tessalon Perles, Tussionex. Her exam was benign today, lungs were clear. Unfortunately continues to have the cough, we will add a chest x-ray, I will refill her Tussionex, and we will switch her to Nasacort for potential postnasal drip.

## 2022-09-14 NOTE — Progress Notes (Signed)
    Procedures performed today:    None.  Independent interpretation of notes and tests performed by another provider:   None.  Brief History, Exam, Impression, and Recommendations:    Arthritis of first metatarsophalangeal (MTP) joint of right foot Resolved after injection at the last visit.  Achilles tendinitis of left lower extremity Resolved after injection at the last visit  Coughing Shannon May has had over a month of cough after a mild viral illness that was not COVID or flu. She has antibiotics, steroids, inhalers, Tessalon Perles, Tussionex. Her exam was benign today, lungs were clear. Unfortunately continues to have the cough, we will add a chest x-ray, I will refill her Tussionex, and we will switch her to Nasacort for potential postnasal drip.    ____________________________________________ Gwen Her. Dianah Field, M.D., ABFM., CAQSM., AME. Primary Care and Sports Medicine [facility]  Adjunct Professor of [employer] of Medicine  Risk manager

## 2022-09-30 ENCOUNTER — Encounter: Payer: Self-pay | Admitting: Family Medicine

## 2022-10-01 MED ORDER — AMOXICILLIN-POT CLAVULANATE 875-125 MG PO TABS: 1.0000 | ORAL_TABLET | Freq: Two times a day (BID) | ORAL | 0 refills | Status: DC

## 2022-10-02 ENCOUNTER — Other Ambulatory Visit: Payer: Self-pay | Admitting: Family Medicine

## 2022-10-08 ENCOUNTER — Telehealth: Payer: Self-pay

## 2022-10-08 DIAGNOSIS — R052 Subacute cough: Secondary | ICD-10-CM

## 2022-10-08 MED ORDER — HYDROCODONE BIT-HOMATROP MBR 5-1.5 MG/5ML PO SOLN: 5.0000 mL | Freq: Three times a day (TID) | ORAL | 0 refills | Status: DC | PRN

## 2022-10-08 NOTE — Telephone Encounter (Signed)
Shannon May called and left a message stating she still has a cough. She is requesting prednisone.

## 2022-10-08 NOTE — Telephone Encounter (Signed)
Please let Shannon May know this is a postinfectious cough and we have been seeing it last 3 months.  Cough suppressants are the treatment of choice, I will send in Hycodan syrup instead since Tussionex was on backorder, continuing to hit this with steroids is not a good idea.

## 2022-10-13 NOTE — Telephone Encounter (Signed)
Left message advising of recommendations.  

## 2022-10-19 ENCOUNTER — Ambulatory Visit (INDEPENDENT_AMBULATORY_CARE_PROVIDER_SITE_OTHER): Payer: BC Managed Care – PPO | Admitting: Surgical

## 2022-10-19 DIAGNOSIS — Z853 Personal history of malignant neoplasm of breast: Secondary | ICD-10-CM | POA: Diagnosis not present

## 2022-10-19 DIAGNOSIS — Z9013 Acquired absence of bilateral breasts and nipples: Secondary | ICD-10-CM

## 2022-10-19 DIAGNOSIS — D0512 Intraductal carcinoma in situ of left breast: Secondary | ICD-10-CM

## 2022-10-19 NOTE — Progress Notes (Signed)
NIPPLE AREOLAR TATTOO PROCEDURE  PREOPERATIVE DIAGNOSIS:  Acquired absence of bilateral nipple areolar   POSTOPERATIVE DIAGNOSIS: Acquired absence of bilateral nipple areolar    PROCEDURES: bilateral nipple areolar tattoo   ANESTHESIA: none  COMPLICATIONS: None.  JUSTIFICATION FOR PROCEDURE:  Shannon May is a 52 y.o. female with a history of breast cancer status post breast reconstruction. The patient presents for nipple areolar complex tattoo. Risks, benefits, indications, and alternatives of the above described procedures were discussed with the patient and all the patient's questions were answered.  The patient previously had nipple areola tattooing a few years ago, however it has faded and she would like to go darker.  DESCRIPTION OF PROCEDURE: After informed consent was obtained and proper identification of patient and surgical site was made, the patient was taken to the procedure room and resting in procedure chair. The patient was prepped and draped in the usual sterile fashion. Attention was turned to the selection of flesh colored permanent tattoo ink to produce the appropriate color for nipple areolar complex tattooing. Color, size and location was confirmed with the patient. Pre-op photos were obtained and placed in patient chart with patient consent. Using a Digital Revo 7R pronged tattoo head, pigment was instilled to the designed nipple areolar complex, which was confirmed preoperatively with the patient. Once adequate pigment had been applied to the nipple areolar complex, vaseline followed by an occlusive dressing was applied. The patient tolerated the procedure well. There were no complications  A 4:1 ratio of Tan Honey and Dark Honey was used to create the desired color of the Areola. 4 part Tan Honey -  Exp 09/2024  1 part Dark Honey -  Exp 09/2024   Dark Honey was used to created the desired shading and montgomery tubercles. Exp 09/2024.  Each areola tattoo was 4x4  cm, 16c^2, total area tattooed: 32cm^2  Virtual visit in 3 weeks Next tattoo appt in 6 weeks.

## 2022-10-25 ENCOUNTER — Encounter: Payer: Self-pay | Admitting: Family Medicine

## 2022-10-25 ENCOUNTER — Ambulatory Visit (INDEPENDENT_AMBULATORY_CARE_PROVIDER_SITE_OTHER): Payer: BC Managed Care – PPO | Admitting: Family Medicine

## 2022-10-25 VITALS — BP 127/72 | HR 82 | Ht 67.0 in | Wt 236.1 lb

## 2022-10-25 DIAGNOSIS — H9193 Unspecified hearing loss, bilateral: Secondary | ICD-10-CM | POA: Diagnosis not present

## 2022-10-25 DIAGNOSIS — R053 Chronic cough: Secondary | ICD-10-CM | POA: Diagnosis not present

## 2022-10-25 NOTE — Patient Instructions (Signed)
Recommend a trial of Nexium or Prilosec over-the-counter taken once a day at bedtime.  I recommend doing a trial for 2 weeks to see if this improves the cough.

## 2022-10-25 NOTE — Progress Notes (Signed)
Acute Office Visit  Subjective:     Patient ID: Shannon May, female    DOB: 12-13-1970, 52 y.o.   MRN: [id number]  Chief Complaint  Patient presents with   Cough    On going cough    HPI Patient is in today for Cough x 2 months.  Using nasal rinses. No fever.  The cough continues to be occasionally productive.  Negative chest x-ray January 9.  She says overall it is a little bit better.  Previously she was waking up 3 times at night now she is able to sleep through the night but then usually has a big coughing fit when she first wakes up.  No fevers or chills she does not feel short of breath.  She was not able to actually get the cough syrup because of the backorder.  She does still have a lot of chronic postnasal drip and drainage.  She is currently using nasal saline and is nasal steroid spray, Flonase.  Also wanted to let me know that she has been having issues with her hearing going in and out especially when she stands up or changes position she says that it has been going on for most a year.  She says it happened today even just getting out of the car and getting up and walking across the parking lot.  She says it can last for up to 3 minutes when it does happen.    ROS      Objective:    BP 127/72 (BP Location: Right Arm, Patient Position: Sitting, Cuff Size: Large)   Pulse 82   Ht 5' 7"$  (1.702 m)   Wt 236 lb 0.9 oz (107.1 kg)   SpO2 96%   BMI 36.97 kg/m    Physical Exam Vitals and nursing note reviewed.  Constitutional:      Appearance: She is well-developed.  HENT:     Head: Normocephalic and atraumatic.     Right Ear: Tympanic membrane and external ear normal.     Left Ear: Tympanic membrane and external ear normal.     Ears:     Comments: He has some irregularity and nodules in the canal. Eyes:     Conjunctiva/sclera: Conjunctivae normal.  Cardiovascular:     Rate and Rhythm: Normal rate and regular rhythm.     Heart sounds: Normal heart sounds.   Pulmonary:     Effort: Pulmonary effort is normal.     Breath sounds: Normal breath sounds.  Skin:    General: Skin is warm and dry.  Neurological:     Mental Status: She is alert and oriented to person, place, and time.  Psychiatric:        Behavior: Behavior normal.     No results found for any visits on 10/25/22.      Assessment & Plan:   Problem List Items Addressed This Visit       Other   Coughing   Relevant Orders   Ambulatory referral to ENT   Other Visit Diagnoses     Bilateral change in hearing    -  Primary   Relevant Orders   Ambulatory referral to ENT       Coughing-I still think it could be postinfectious.  Chest x-ray done about a month ago was negative for any acute findings and the cough does seem to be getting gradually better.  We did discuss a trial of PPI for 2 weeks just to see if that helps  some people can get into her cycle with reflux and cough so I think it is worth a try.  She is pretty sure she has a PPI at home.  I can also refer to ENT consider other causes such as a subacute sinusitis that could be causing her symptoms.  Change in hearing-will also refer to ENT she notices the change in hearing when she changes position and it seems like it comes in and out.  We did do orthostatics and those were normal today and blood pressure looks fantastic.  No orders of the defined types were placed in this encounter.   No follow-ups on file.  Beatrice Lecher, MD

## 2022-11-03 ENCOUNTER — Other Ambulatory Visit: Payer: Self-pay | Admitting: Family Medicine

## 2022-11-09 ENCOUNTER — Ambulatory Visit (INDEPENDENT_AMBULATORY_CARE_PROVIDER_SITE_OTHER): Payer: BC Managed Care – PPO | Admitting: Surgical

## 2022-11-09 DIAGNOSIS — Z9013 Acquired absence of bilateral breasts and nipples: Secondary | ICD-10-CM

## 2022-11-09 DIAGNOSIS — D0512 Intraductal carcinoma in situ of left breast: Secondary | ICD-10-CM

## 2022-11-09 NOTE — Progress Notes (Signed)
Patient is a 52 year old female who presents via telephone to discuss recent nipple areola tattooing on 10/19/2022.  She reports it is healed very well, the color is very even and she is overall happy.  She reports that she will be here in a few weeks and would like to work on the 3D portion of the tattoo at that time.  The patient gave consent to have this visit done by telemedicine / virtual visit, two identifiers were used to identify patient. This is also consent for access the chart and treat the patient via this visit. The patient is located in [place].  I, the provider, am at the office.  We spent 2 minutes together for the visit.  Joined by telephone.  We discussed plan for upcoming appointment on 11/30/2022 for additional tattooing, we will plan to add some Montgomery tubercles as well as shading around the nipple for 3D effect.

## 2022-11-29 ENCOUNTER — Other Ambulatory Visit: Payer: Self-pay | Admitting: Physician Assistant

## 2022-11-29 ENCOUNTER — Other Ambulatory Visit: Payer: Self-pay | Admitting: Family Medicine

## 2022-11-29 DIAGNOSIS — J329 Chronic sinusitis, unspecified: Secondary | ICD-10-CM

## 2022-11-30 ENCOUNTER — Ambulatory Visit (INDEPENDENT_AMBULATORY_CARE_PROVIDER_SITE_OTHER): Payer: BC Managed Care – PPO | Admitting: Surgical

## 2022-11-30 DIAGNOSIS — Z853 Personal history of malignant neoplasm of breast: Secondary | ICD-10-CM | POA: Diagnosis not present

## 2022-11-30 DIAGNOSIS — Z9013 Acquired absence of bilateral breasts and nipples: Secondary | ICD-10-CM | POA: Diagnosis not present

## 2022-11-30 DIAGNOSIS — D0512 Intraductal carcinoma in situ of left breast: Secondary | ICD-10-CM

## 2022-11-30 NOTE — Progress Notes (Signed)
NIPPLE AREOLAR TATTOO PROCEDURE  PREOPERATIVE DIAGNOSIS:  Acquired absence of bilateral nipple areolar   POSTOPERATIVE DIAGNOSIS: Acquired absence of bilateral nipple areolar    PROCEDURES: bilateral nipple areolar tattoo   ANESTHESIA: none  COMPLICATIONS: None.  JUSTIFICATION FOR PROCEDURE:  [name] is a 52 y.o. female with a history of breast cancer status post breast reconstruction. The patient presents for nipple areolar complex tattoo. Risks, benefits, indications, and alternatives of the above described procedures were discussed with the patient and all the patient's questions were answered. Consent was signed.  DESCRIPTION OF PROCEDURE: After informed consent was obtained and proper identification of patient and surgical site was made, the patient was taken to the procedure room and resting in procedure chair. The patient was prepped and draped in the usual sterile fashion. Attention was turned to the selection of flesh colored permanent tattoo ink to produce the appropriate color for nipple areolar complex tattooing. Color, size and location was confirmed with the patient. Pre-op photos were obtained and placed in patient chart with patient consent. Using a Digital Revo 7R pronged tattoo head and 1P tattoo head pigment was instilled to the designed nipple areolar complex, which was confirmed preoperatively with the patient. Once adequate pigment had been applied to the nipple areolar complex for shading, montgomery tubercles and central nipple shading, vaseline followed by an occlusive dressing was applied. The patient tolerated the procedure well. There were no complications  Dark Honey was used for the areola shading -  Exp 09/2024  Dark Honey, Tan Honey and Fair Honey was used for the nipple 1 part Dark Honey, 1 part Tan Honey and 2 parts Fair Honey -  Exp 09/2024  Dark Honey was used for the montgomery tubercles.  Total area tattooed: 10cm^2

## 2022-12-03 ENCOUNTER — Other Ambulatory Visit: Payer: Self-pay | Admitting: Family Medicine

## 2022-12-20 ENCOUNTER — Encounter: Payer: Self-pay | Admitting: *Deleted

## 2022-12-31 ENCOUNTER — Ambulatory Visit (INDEPENDENT_AMBULATORY_CARE_PROVIDER_SITE_OTHER): Payer: BC Managed Care – PPO | Admitting: Surgical

## 2022-12-31 ENCOUNTER — Encounter: Payer: Self-pay | Admitting: Surgical

## 2022-12-31 DIAGNOSIS — D0512 Intraductal carcinoma in situ of left breast: Secondary | ICD-10-CM

## 2022-12-31 NOTE — Progress Notes (Signed)
Patient is a very pleasant 52 year old female who presents via telephone to discuss nipple areola tattooing.  She was last seen here in the office on 11/30/2022.  She is 1 month post procedure.  She reports today that she is doing well, she is happy with the overall result and feels as if it has healed well.  She is not having any issues with that at this time.    The patient gave consent to have this visit done by telemedicine / virtual visit, two identifiers were used to identify patient. This is also consent for access the chart and treat the patient via this visit. The patient is located in [place].  I, the provider, am at the office.  We spent 3 minutes together for the visit.  Joined by telephone.  Recommend following up in 1 year for reevaluation of bilateral breast reconstruction, potentially ordering ultrasound/other imaging to evaluate implant integrity. She knows to call with questions or concerns prior to this appointment.  Discussed with patient I am happy to see her back for additional tattooing if necessary in the future.

## 2023-01-15 ENCOUNTER — Other Ambulatory Visit: Payer: Self-pay | Admitting: Family Medicine

## 2023-03-08 NOTE — Progress Notes (Unsigned)
   Established Patient Office Visit  Subjective   Patient ID: Shannon May, female    DOB: 02-19-1971  Age: 52 y.o. MRN: [id number]  No chief complaint on file.   HPI  Hypertension- Pt denies chest pain, SOB, dizziness, or heart palpitations.  Taking meds as directed w/o problems.  Denies medication side effects.     {History (Optional):23778}  ROS    Objective:     There were no vitals taken for this visit. {Vitals History (Optional):23777}  Physical Exam   No results found for any visits on 03/09/23.  {Labs (Optional):23779}  The 10-year ASCVD risk score (Arnett DK, et al., 2019) is: 3.7%    Assessment & Plan:   Problem List Items Addressed This Visit       Cardiovascular and Mediastinum   Hypertension - Primary     Other   Hyperlipidemia   Other Visit Diagnoses     Abnormal glucose           No follow-ups on file.    Nani Gasser, MD

## 2023-03-09 ENCOUNTER — Ambulatory Visit (INDEPENDENT_AMBULATORY_CARE_PROVIDER_SITE_OTHER): Payer: BC Managed Care – PPO | Admitting: Family Medicine

## 2023-03-09 ENCOUNTER — Encounter: Payer: Self-pay | Admitting: Family Medicine

## 2023-03-09 VITALS — BP 118/56 | HR 63 | Ht 67.0 in | Wt 216.0 lb

## 2023-03-09 DIAGNOSIS — R590 Localized enlarged lymph nodes: Secondary | ICD-10-CM

## 2023-03-09 DIAGNOSIS — E785 Hyperlipidemia, unspecified: Secondary | ICD-10-CM | POA: Diagnosis not present

## 2023-03-09 DIAGNOSIS — R7309 Other abnormal glucose: Secondary | ICD-10-CM

## 2023-03-09 DIAGNOSIS — R7301 Impaired fasting glucose: Secondary | ICD-10-CM | POA: Insufficient documentation

## 2023-03-09 DIAGNOSIS — I10 Essential (primary) hypertension: Secondary | ICD-10-CM

## 2023-03-09 NOTE — Assessment & Plan Note (Signed)
Due to recheck lipid panel. 

## 2023-03-09 NOTE — Assessment & Plan Note (Signed)
Last A1c was elevated at 6.1 she has done a fantastic job and lost 20+ pounds.  She is also on Mounjaro so suspect repeat A1c should look great.

## 2023-03-09 NOTE — Patient Instructions (Signed)
Let me know if your blood pressure starts going low.

## 2023-03-09 NOTE — Assessment & Plan Note (Signed)
Pressure looks phenomenal.  As she continues to lose weight if she starts to noticed a drop in blood pressures then please let me know.

## 2023-03-10 LAB — COMPLETE METABOLIC PANEL WITH GFR
AG Ratio: 1.9 (calc) (ref 1.0–2.5)
ALT: 20 U/L (ref 6–29)
AST: 20 U/L (ref 10–35)
Albumin: 4.8 g/dL (ref 3.6–5.1)
Alkaline phosphatase (APISO): 62 U/L (ref 37–153)
BUN: 17 mg/dL (ref 7–25)
CO2: 29 mmol/L (ref 20–32)
Calcium: 9.8 mg/dL (ref 8.6–10.4)
Chloride: 102 mmol/L (ref 98–110)
Creat: 0.75 mg/dL (ref 0.50–1.03)
Globulin: 2.5 g/dL (calc) (ref 1.9–3.7)
Glucose, Bld: 90 mg/dL (ref 65–99)
Potassium: 4.1 mmol/L (ref 3.5–5.3)
Sodium: 139 mmol/L (ref 135–146)
Total Bilirubin: 0.5 mg/dL (ref 0.2–1.2)
Total Protein: 7.3 g/dL (ref 6.1–8.1)
eGFR: 95 mL/min/{1.73_m2} (ref >=60)

## 2023-03-10 LAB — LIPID PANEL W/REFLEX DIRECT LDL
Cholesterol: 209 mg/dL — ABNORMAL HIGH (ref <200)
HDL: 52 mg/dL (ref >=50)
LDL Cholesterol (Calc): 139 mg/dL (calc) — ABNORMAL HIGH
Non-HDL Cholesterol (Calc): 157 mg/dL (calc) — ABNORMAL HIGH (ref <130)
Total CHOL/HDL Ratio: 4 (calc) (ref <5.0)
Triglycerides: 84 mg/dL (ref <150)

## 2023-03-10 LAB — HEMOGLOBIN A1C
Hgb A1c MFr Bld: 5.5 % of total Hgb (ref <5.7)
Mean Plasma Glucose: 111 mg/dL
eAG (mmol/L): 6.2 mmol/L

## 2023-03-11 NOTE — Progress Notes (Signed)
Hi [place], your LDL cholesterol is still elevated at 139, though it is better than it was last year.  Continue to work at it. A1c looks great at 5.5.  And your metabolic panel looks normal.  The 10-year ASCVD risk score (Arnett DK, et al., 2019) is: 2.1%   Values used to calculate the score:     Age: 52 years     Sex: Female     Is Non-Hispanic African American: No     Diabetic: No     Tobacco smoker: No     Systolic Blood Pressure: 118 mmHg     Is BP treated: Yes     HDL Cholesterol: 52 mg/dL     Total Cholesterol: 209 mg/dL

## 2023-03-16 ENCOUNTER — Other Ambulatory Visit: Payer: Self-pay | Admitting: Family Medicine

## 2023-03-16 DIAGNOSIS — I10 Essential (primary) hypertension: Secondary | ICD-10-CM

## 2023-03-17 ENCOUNTER — Other Ambulatory Visit: Payer: Self-pay | Admitting: Family Medicine

## 2023-04-15 ENCOUNTER — Other Ambulatory Visit: Payer: Self-pay | Admitting: General Surgery

## 2023-04-15 DIAGNOSIS — R590 Localized enlarged lymph nodes: Secondary | ICD-10-CM

## 2023-05-17 LAB — HM PAP SMEAR: HPV, high-risk: NEGATIVE

## 2023-05-24 ENCOUNTER — Ambulatory Visit
Admission: RE | Admit: 2023-05-24 | Discharge: 2023-05-24 | Disposition: A | Discharge status: Home / Self Care | Payer: BC Managed Care – PPO | Source: Ambulatory Visit | Attending: General Surgery | Admitting: General Surgery

## 2023-05-24 ENCOUNTER — Other Ambulatory Visit: Payer: Self-pay | Admitting: General Surgery

## 2023-05-24 DIAGNOSIS — R590 Localized enlarged lymph nodes: Secondary | ICD-10-CM

## 2023-05-25 ENCOUNTER — Other Ambulatory Visit: Payer: Self-pay | Admitting: General Surgery

## 2023-05-25 DIAGNOSIS — R2232 Localized swelling, mass and lump, left upper limb: Secondary | ICD-10-CM

## 2023-05-31 ENCOUNTER — Ambulatory Visit
Admission: RE | Admit: 2023-05-31 | Discharge: 2023-05-31 | Disposition: A | Discharge status: Home / Self Care | Payer: BC Managed Care – PPO | Source: Ambulatory Visit | Attending: General Surgery | Admitting: General Surgery

## 2023-05-31 DIAGNOSIS — R2232 Localized swelling, mass and lump, left upper limb: Secondary | ICD-10-CM

## 2023-06-01 LAB — SURGICAL PATHOLOGY

## 2023-09-12 NOTE — Progress Notes (Addendum)
 Established Patient Office Visit  Subjective  Patient ID: Shannon May, female    DOB: 02/14/1971  Age: 53 y.o. MRN: [id number]  Chief Complaint  Patient presents with   Hypertension   ifg    HPI Hypertension- Pt denies chest pain, SOB, dizziness, or heart palpitations.  Taking meds as directed w/o problems.  Denies medication side effects.    He is also still on compounded weight loss medication, Mounjaro and is still continuing to do well.  She is down another 14 pounds since I last saw her and is down to 202 with a BMI of 31.  She has been tolerating it well.  She is managing the constipation.  So when I saw her last time she had noted a swollen axillary lymph node on the left we got her back in with Dr. Curvin her general surgeon and they ended up biopsying the lymph node.  The lymph node was benign but it was inflamed secondary to dye from her tattooing of her nipple.  She also had a renal cyst that was noted on some imaging several years ago.  She is due for a follow-up breast MRI and she is going talk to them about maybe doing an MRI of the abdomen as well.  ROS    Objective:     BP (!) 122/57   Pulse 62   Ht 5' 7 (1.702 m)   Wt 202 lb (91.6 kg)   SpO2 100%   BMI 31.64 kg/m    Physical Exam Vitals and nursing note reviewed.  Constitutional:      Appearance: Normal appearance.  HENT:     Head: Normocephalic and atraumatic.  Eyes:     Conjunctiva/sclera: Conjunctivae normal.  Cardiovascular:     Rate and Rhythm: Normal rate and regular rhythm.  Pulmonary:     Effort: Pulmonary effort is normal.     Breath sounds: Normal breath sounds.  Skin:    General: Skin is warm and dry.  Neurological:     Mental Status: She is alert.  Psychiatric:        Mood and Affect: Mood normal.      Results for orders placed or performed in visit on 09/13/23  POCT HgB A1C  Result Value Ref Range   Hemoglobin A1C 5.2 4.0 - 5.6 %   HbA1c POC (<> result, manual entry)      HbA1c, POC (prediabetic range)     HbA1c, POC (controlled diabetic range)        The 10-year ASCVD risk score (Arnett DK, et al., 2019) is: 2.5%    Assessment & Plan:   Problem List Items Addressed This Visit       Cardiovascular and Mediastinum   Hypertension - Primary   Pressure looks great today we did discuss that if she starts seeing blood pressures consistently under 110 that we can either decrease the losartan  to 25 or we can stop the HCTZ.  She says if it does happen she prefer to go down on the losartan  first.      Relevant Medications   hydrochlorothiazide  (MICROZIDE ) 12.5 MG capsule   losartan  (COZAAR ) 50 MG tablet   Other Relevant Orders   POCT HgB A1C (Completed)   BMP8+EGFR     Endocrine   IFG (impaired fasting glucose)   A1c looks fantastic today.  Doing really well on GLP-1 in addition to the weight loss that she is undergone over the last year.  Plan to recheck again  in 6 months.        Other   Depression, major, single episode, mild (HCC)   PHQ 9 score of 0 today continue Lexapro  10 mg refill sent to pharmacy.  Follow back up in 6 months.      Relevant Medications   escitalopram  (LEXAPRO ) 10 MG tablet    Renal Cyst-we can always schedule follow-up with an ultrasound if needed.  Return in about 6 months (around 03/12/2024) for Hypertension, cholesterol check .    Dorothyann Byars, MD

## 2023-09-13 ENCOUNTER — Encounter: Payer: Self-pay | Admitting: Family Medicine

## 2023-09-13 ENCOUNTER — Ambulatory Visit (INDEPENDENT_AMBULATORY_CARE_PROVIDER_SITE_OTHER): Payer: BC Managed Care – PPO | Admitting: Family Medicine

## 2023-09-13 VITALS — BP 122/57 | HR 62 | Ht 67.0 in | Wt 202.0 lb

## 2023-09-13 DIAGNOSIS — F320 Major depressive disorder, single episode, mild: Secondary | ICD-10-CM | POA: Diagnosis not present

## 2023-09-13 DIAGNOSIS — R7301 Impaired fasting glucose: Secondary | ICD-10-CM

## 2023-09-13 DIAGNOSIS — I10 Essential (primary) hypertension: Secondary | ICD-10-CM

## 2023-09-13 LAB — POCT GLYCOSYLATED HEMOGLOBIN (HGB A1C): Hemoglobin A1C: 5.2 % (ref 4.0–5.6)

## 2023-09-13 MED ORDER — ESCITALOPRAM OXALATE 10 MG PO TABS: 10.0000 mg | ORAL_TABLET | Freq: Every day | ORAL | 1 refills | Status: DC

## 2023-09-13 MED ORDER — LOSARTAN POTASSIUM 50 MG PO TABS: 50.0000 mg | ORAL_TABLET | Freq: Every day | ORAL | 1 refills | Status: DC

## 2023-09-13 MED ORDER — HYDROCHLOROTHIAZIDE 12.5 MG PO CAPS
12.5000 mg | ORAL_CAPSULE | Freq: Every day | ORAL | 1 refills | Status: DC
Start: 2023-09-13 — End: 2024-04-09

## 2023-09-13 NOTE — Assessment & Plan Note (Signed)
 A1c looks fantastic today.  Doing really well on GLP-1 in addition to the weight loss that she is undergone over the last year.  Plan to recheck again in 6 months.

## 2023-09-13 NOTE — Assessment & Plan Note (Signed)
 Pressure looks great today we did discuss that if she starts seeing blood pressures consistently under 110 that we can either decrease the losartan to 25 or we can stop the HCTZ.  She says if it does happen she prefer to go down on the losartan first.

## 2023-09-13 NOTE — Assessment & Plan Note (Addendum)
 PHQ 9 score of 0 today continue Lexapro 10 mg refill sent to pharmacy.  Follow back up in 6 months.

## 2023-09-14 LAB — BMP8+EGFR
BUN/Creatinine Ratio: 20 (ref 9–23)
BUN: 13 mg/dL (ref 6–24)
CO2: 23 mmol/L (ref 20–29)
Calcium: 9.3 mg/dL (ref 8.7–10.2)
Chloride: 105 mmol/L (ref 96–106)
Creatinine, Ser: 0.66 mg/dL (ref 0.57–1.00)
Glucose: 91 mg/dL (ref 70–99)
Potassium: 4.1 mmol/L (ref 3.5–5.2)
Sodium: 143 mmol/L (ref 134–144)
eGFR: 104 mL/min/{1.73_m2} (ref >59)

## 2023-12-30 ENCOUNTER — Ambulatory Visit: Payer: BC Managed Care – PPO | Admitting: Plastic Surgery

## 2024-02-03 ENCOUNTER — Ambulatory Visit: Admitting: Plastic Surgery

## 2024-02-03 ENCOUNTER — Encounter: Payer: Self-pay | Admitting: Plastic Surgery

## 2024-02-03 VITALS — BP 118/76 | HR 70 | Ht 67.0 in | Wt 191.8 lb

## 2024-02-03 DIAGNOSIS — Z853 Personal history of malignant neoplasm of breast: Secondary | ICD-10-CM | POA: Diagnosis not present

## 2024-02-03 DIAGNOSIS — Z9013 Acquired absence of bilateral breasts and nipples: Secondary | ICD-10-CM

## 2024-02-03 NOTE — Progress Notes (Signed)
 Patient ID: Shannon May, female    DOB: 1971-07-25, 53 y.o.   MRN: [id number]   Chief Complaint  Patient presents with   Follow-up    The patient is a 53 year old female here for yearly visit.  She had been diagnosed with breast cancer in 2019 and underwent surgery for mastectomies and reconstruction.  She has [place] smooth round high-profile 590 cc gel implants in.  She then had fat grafting in June 2020.  She is doing well overall and back to work.  About a year ago the patient noticed some tenderness in the left axillary area so went for an ultrasound.  A biopsy was done and actually found tattoo material in the lymph node.  It was otherwise negative.  This gave her relief and she has not had any trouble since.  Implants are soft and no areas of concern noted.  She did start Mounjaro and has lost 50 pounds in the past year.  She is planning to lose a little bit more.    Review of Systems  Constitutional: Negative.   HENT: Negative.    Eyes: Negative.   Respiratory: Negative.    Cardiovascular: Negative.   Gastrointestinal: Negative.   Endocrine: Negative.   Genitourinary: Negative.   Musculoskeletal: Negative.     Past Medical History:  Diagnosis Date   Anxiety    Diverticulosis    Hypertension 11/08/2017   Malignant neoplasm of upper-outer quadrant of left breast in female, estrogen receptor positive (HCC) 06/08/2018   Sinusitis 11/15/2017    Past Surgical History:  Procedure Laterality Date   BREAST RECONSTRUCTION WITH PLACEMENT OF TISSUE EXPANDER AND ALLODERM N/A 08/14/2018   Procedure: BREAST RECONSTRUCTION WITH PLACEMENT OF TISSUE EXPANDER AND FLEXHD;  Surgeon: Thornell Flirt, DO;  Location: [facility];  Service: Plastics;  Laterality: N/A;   BREAST SURGERY     DILATION AND CURETTAGE OF UTERUS     LASIK     LIPOSUCTION Bilateral 11/15/2018   Procedure: LIPOSUCTION;  Surgeon: Thornell Flirt, DO;  Location: [facility];  Service: Plastics;   Laterality: Bilateral;   LIPOSUCTION WITH LIPOFILLING Bilateral 02/22/2019   Procedure: LIPOSUCTION WITH LIPOFILLING;  Surgeon: Thornell Flirt, DO;  Location: [facility];  Service: Plastics;  Laterality: Bilateral;  2 hours, please   MASTECTOMY W/ SENTINEL NODE BIOPSY Bilateral 08/14/2018   with breast reconstruction   MASTECTOMY W/ SENTINEL NODE BIOPSY Bilateral 08/14/2018   Procedure: BILATERAL MASTECTOMIES  WITH LEFT SENTINEL LYMPH NODE MAPPING;  Surgeon: Caralyn Chandler, MD;  Location: [facility];  Service: General;  Laterality: Bilateral;   REMOVAL OF BILATERAL TISSUE EXPANDERS WITH PLACEMENT OF BILATERAL BREAST IMPLANTS Bilateral 11/15/2018   Procedure: removal of bilateral expanders and placement of bilateral silicone implants.;  Surgeon: Thornell Flirt, DO;  Location: [facility];  Service: Plastics;  Laterality: Bilateral;      Current Outpatient Medications:    acetaminophen  (TYLENOL ) 500 MG tablet, Take 1,000 mg by mouth every 6 (six) hours as needed for moderate pain., Disp: , Rfl:    cetirizine (ZYRTEC ALLERGY) 10 MG tablet, Take 10 mg by mouth daily., Disp: , Rfl:    clobetasol (TEMOVATE) 0.05 % external solution, Apply 1 application. topically daily., Disp: , Rfl:    Docusate Calcium (STOOL SOFTENER PO), Take 1 tablet by mouth 2 (two) times daily. , Disp: , Rfl:    escitalopram  (LEXAPRO ) 10 MG tablet, Take 1 tablet (10 mg total) by mouth  daily., Disp: 90 tablet, Rfl: 1   estradiol (ESTRACE) 0.1 MG/GM vaginal cream, SMARTSIG:1 Applicator Vaginal Daily, Disp: , Rfl:    FIBER PO, Take by mouth., Disp: , Rfl:    fluticasone  (FLONASE ) 50 MCG/ACT nasal spray, Place into both nostrils daily., Disp: , Rfl:    hydrochlorothiazide  (MICROZIDE ) 12.5 MG capsule, Take 1 capsule (12.5 mg total) by mouth daily., Disp: 90 capsule, Rfl: 1   ibuprofen (ADVIL) 200 MG tablet, Take 200 mg by mouth every 6 (six) hours as needed for moderate pain., Disp: , Rfl:     losartan  (COZAAR ) 50 MG tablet, Take 1 tablet (50 mg total) by mouth at bedtime., Disp: 90 tablet, Rfl: 1   Multiple Vitamins-Minerals (MULTIVITAMIN WOMEN PO), Take 1 capsule by mouth daily., Disp: , Rfl:    Probiotic Product (PRO-BIOTIC BLEND PO), Take 1 tablet by mouth daily. , Disp: , Rfl:    tirzepatide (MOUNJARO) 10 MG/0.5ML Pen, Inject 10 mg into the skin once a week., Disp: , Rfl:    tirzepatide (ZEPBOUND) 5 MG/0.5ML Pen, Inject 5 mg into the skin once a week., Disp: , Rfl:    Objective:   Vitals:   02/03/24 1218  BP: 118/76  Pulse: 70  SpO2: 97%    Physical Exam Vitals reviewed.  Constitutional:      Appearance: Normal appearance.  HENT:     Head: Atraumatic.  Cardiovascular:     Rate and Rhythm: Normal rate.     Pulses: Normal pulses.  Pulmonary:     Effort: Pulmonary effort is normal.  Abdominal:     Palpations: Abdomen is soft.  Skin:    General: Skin is warm.     Capillary Refill: Capillary refill takes less than 2 seconds.  Neurological:     Mental Status: She is alert and oriented to person, place, and time.  Psychiatric:        Mood and Affect: Mood normal.        Behavior: Behavior normal.        Thought Content: Thought content normal.        Judgment: Judgment normal.     Assessment & Plan:  Acquired absence of both breasts  The patient may be interested in some fat grafting as well as the possibility of an abdominoplasty.  She is going to give it another 6 months to see if she can drop a little bit more weight and then come and see us  in January.  At that time we may talk about doing an ultrasound of the breast with possible fat grafting of the breast and abdominoplasty of the abdomen.  Pictures were obtained of the patient and placed in the chart with the patient's or guardian's permission.   Lindaann Requena Chisom Aust, DO

## 2024-02-24 ENCOUNTER — Telehealth: Payer: Self-pay

## 2024-02-24 MED ORDER — LOSARTAN POTASSIUM 25 MG PO TABS: 25.0000 mg | ORAL_TABLET | Freq: Every day | ORAL | 0 refills | Status: DC

## 2024-02-24 NOTE — Telephone Encounter (Signed)
 Spoke with patient.  States she is going to be out of town at current [facility] f/u appt schld for 03/12/24. She is concerned as next available appt was not until August with provider.  She states that she has lost 50lbs - she felt her BP had gotten too low at 106/68 so she has divided the Losartan  50mg  in half and now is only taking Losartan  25mg  at bedtime. She states her BP has been just about perfect since this change -averaging about 120/70's or 80's. She has no other complaints or worries regarding her blood pressure. I told her that I felt that she would be fine to wait until August for the HTN f/u visit and patient was scheduled for April 09, 2024. I told her I would contact her if Dr. Greer Leak felt that she did need to be seen sooner.

## 2024-02-24 NOTE — Telephone Encounter (Signed)
 Pt rescheduled.

## 2024-02-24 NOTE — Telephone Encounter (Signed)
 Copied from CRM [id number]. Topic: Appointments - Appointment Cancel/Reschedule >> Feb 24, 2024 11:03 AM Shannon May wrote: Patient called because during her appointment day she will be out of town from 7th until the 11th and 21st to the 25th and she is needing to be seen due to the appointment being a blood pressure check. Providers next appointment isn't until August can you assist with this callback number is [phone].

## 2024-02-24 NOTE — Telephone Encounter (Signed)
 Ok , sent new rx for 25mg   Please place on cancellation list up front  Meds ordered this encounter  Medications   losartan  (COZAAR ) 25 MG tablet    Sig: Take 1 tablet (25 mg total) by mouth daily.    Dispense:  90 tablet    Refill:  0

## 2024-03-12 ENCOUNTER — Ambulatory Visit: Payer: 59 | Admitting: Family Medicine

## 2024-04-09 ENCOUNTER — Encounter: Payer: Self-pay | Admitting: Family Medicine

## 2024-04-09 ENCOUNTER — Ambulatory Visit (INDEPENDENT_AMBULATORY_CARE_PROVIDER_SITE_OTHER): Admitting: Family Medicine

## 2024-04-09 ENCOUNTER — Telehealth: Payer: Self-pay | Admitting: Family Medicine

## 2024-04-09 VITALS — BP 111/65 | HR 72 | Ht 67.0 in | Wt 194.0 lb

## 2024-04-09 DIAGNOSIS — F320 Major depressive disorder, single episode, mild: Secondary | ICD-10-CM

## 2024-04-09 DIAGNOSIS — N281 Cyst of kidney, acquired: Secondary | ICD-10-CM | POA: Diagnosis not present

## 2024-04-09 DIAGNOSIS — R7301 Impaired fasting glucose: Secondary | ICD-10-CM

## 2024-04-09 DIAGNOSIS — I10 Essential (primary) hypertension: Secondary | ICD-10-CM

## 2024-04-09 DIAGNOSIS — Z23 Encounter for immunization: Secondary | ICD-10-CM

## 2024-04-09 MED ORDER — LOSARTAN POTASSIUM 25 MG PO TABS: 25.0000 mg | ORAL_TABLET | Freq: Every day | ORAL | 1 refills | Status: AC

## 2024-04-09 MED ORDER — ESCITALOPRAM OXALATE 5 MG PO TABS: 5.0000 mg | ORAL_TABLET | Freq: Every day | ORAL | 3 refills | Status: AC

## 2024-04-09 MED ORDER — HYDROCHLOROTHIAZIDE 12.5 MG PO CAPS: 12.5000 mg | ORAL_CAPSULE | Freq: Every day | ORAL | 1 refills | Status: AC

## 2024-04-09 NOTE — Telephone Encounter (Signed)
 Please call [facility] for last Pap smear she said she has 1 done every year.

## 2024-04-09 NOTE — Assessment & Plan Note (Addendum)
 Well controlled.  Now she would like to keep the losartan  and the hydrochlorothiazide  separate.  Continue to monitor blood pressure especially as she loses weight if she starting to notice low blood pressures then please let us  know.  Continue current regimen. Follow up in  6 mo. Due for labs.

## 2024-04-09 NOTE — Assessment & Plan Note (Signed)
 In regards to mood she actually feels like she is doing really well and would like to be able to go down to 5 mg on her Lexapro .

## 2024-04-09 NOTE — Assessment & Plan Note (Addendum)
 Due for A!C we will check that today.  She is on tirzepatide.

## 2024-04-09 NOTE — Progress Notes (Signed)
 Established Patient Office Visit  Subjective  Patient ID: Shannon May, female    DOB: Apr 16, 1971  Age: 53 y.o. MRN: [id number]  Chief Complaint  Patient presents with   Hypertension    HPI  Hypertension- Pt denies chest pain, SOB, dizziness, or heart palpitations.  Taking meds as directed w/o problems.  Denies medication side effects.    Has pap smear scheduled.  She says she gets 1 yearly even if it is cost out-of-pocket because of her breast cancer history she tries to be cautious.  She is still on tirzepatide for weight management and is currently at 10 mg she does feel like she has plateaued though she knows she needs to work on trying to really up her protein intake.  Also had a cyst seen on her right kidney years ago on CT and would like to have that reevaluated and followed up.    ROS    Objective:     BP 111/65   Pulse 72   Ht 5' 7 (1.702 m)   Wt 194 lb (88 kg)   SpO2 98%   BMI 30.38 kg/m    Physical Exam Vitals and nursing note reviewed.  Constitutional:      Appearance: Normal appearance.  HENT:     Head: Normocephalic and atraumatic.  Eyes:     Conjunctiva/sclera: Conjunctivae normal.  Cardiovascular:     Rate and Rhythm: Normal rate and regular rhythm.  Pulmonary:     Effort: Pulmonary effort is normal.     Breath sounds: Normal breath sounds.  Skin:    General: Skin is warm and dry.  Neurological:     Mental Status: She is alert.  Psychiatric:        Mood and Affect: Mood normal.      No results found for any visits on 04/09/24.    The 10-year ASCVD risk score (Arnett DK, et al., 2019) is: 2.2%    Assessment & Plan:   Problem List Items Addressed This Visit       Cardiovascular and Mediastinum   Hypertension - Primary   Well controlled.  Now she would like to keep the losartan  and the hydrochlorothiazide  separate.  Continue to monitor blood pressure especially as she loses weight if she starting to notice low blood  pressures then please let us  know.  Continue current regimen. Follow up in  6 mo. Due for labs.       Relevant Medications   hydrochlorothiazide  (MICROZIDE ) 12.5 MG capsule   losartan  (COZAAR ) 25 MG tablet   Other Relevant Orders   CMP14+EGFR   Lipid panel   CBC   Hemoglobin A1c     Endocrine   IFG (impaired fasting glucose)   Due for A!C we will check that today.  She is on tirzepatide.      Relevant Orders   CMP14+EGFR   Lipid panel   CBC   Hemoglobin A1c     Other   Depression, major, single episode, mild (HCC)   In regards to mood she actually feels like she is doing really well and would like to be able to go down to 5 mg on her Lexapro .      Relevant Medications   escitalopram  (LEXAPRO ) 5 MG tablet   Other Visit Diagnoses       Cyst of right kidney       Relevant Orders   US  Renal     Encounter for immunization  Relevant Orders   Pneumococcal conjugate vaccine 20-valent (Completed)      Given prevnar 20 today.    Return in about 6 months (around 10/10/2024) for Hypertension.    Dorothyann Byars, MD

## 2024-04-10 NOTE — Telephone Encounter (Signed)
 When she was here yesterday she said that she had

## 2024-04-13 ENCOUNTER — Other Ambulatory Visit: Payer: Self-pay | Admitting: Family Medicine

## 2024-04-13 DIAGNOSIS — F320 Major depressive disorder, single episode, mild: Secondary | ICD-10-CM

## 2024-04-13 NOTE — Telephone Encounter (Signed)
 Did we request the last one?

## 2024-04-16 ENCOUNTER — Ambulatory Visit (INDEPENDENT_AMBULATORY_CARE_PROVIDER_SITE_OTHER)

## 2024-04-16 DIAGNOSIS — N281 Cyst of kidney, acquired: Secondary | ICD-10-CM

## 2024-04-30 ENCOUNTER — Ambulatory Visit: Payer: Self-pay | Admitting: Family Medicine

## 2024-04-30 NOTE — Progress Notes (Signed)
 Hi Shannon May your kidney US  doesn't show a cyst.  Good news !!!!!!

## 2024-05-08 ENCOUNTER — Encounter: Payer: Self-pay | Admitting: Sports Medicine

## 2024-09-18 ENCOUNTER — Encounter: Payer: Self-pay | Admitting: Plastic Surgery

## 2024-09-18 ENCOUNTER — Ambulatory Visit: Admitting: Plastic Surgery

## 2024-09-18 VITALS — BP 115/77 | HR 70

## 2024-09-18 DIAGNOSIS — Z853 Personal history of malignant neoplasm of breast: Secondary | ICD-10-CM | POA: Diagnosis not present

## 2024-09-18 DIAGNOSIS — N651 Disproportion of reconstructed breast: Secondary | ICD-10-CM

## 2024-09-18 DIAGNOSIS — C50919 Malignant neoplasm of unspecified site of unspecified female breast: Secondary | ICD-10-CM

## 2024-09-18 DIAGNOSIS — Z9013 Acquired absence of bilateral breasts and nipples: Secondary | ICD-10-CM | POA: Diagnosis not present

## 2024-09-18 DIAGNOSIS — Z9889 Other specified postprocedural states: Secondary | ICD-10-CM

## 2024-09-18 NOTE — Progress Notes (Signed)
 "    Patient ID: Niels DELENA Brace, female    DOB: 10-06-1970, 54 y.o.   MRN: [id number]   Chief Complaint  Patient presents with   Follow-up   Breast Cancer    The patient is a 54 year old female here for follow-up after undergoing bilateral mastectomies.  She had the exchange from expanders to implants in March 2020.  She has Mentor smooth round high-profile gel 590 cc implants in place.  She then had lipo filling to both breasts in June 2020.  She is interested in a little more filling for improved symmetry.  She is already had her nipple areola tattooing.  She is also interested in an abdominoplasty.  She has no sign of a hernia and she has lost about 65 pounds and feels she is probably at her desired weight.    Review of Systems  Constitutional: Negative.   Eyes: Negative.   Respiratory: Negative.    Cardiovascular: Negative.   Gastrointestinal: Negative.   Endocrine: Negative.   Genitourinary: Negative.   Musculoskeletal: Negative.   Skin: Negative.     Past Medical History:  Diagnosis Date   Anxiety    Diverticulosis    Hypertension 11/08/2017   Malignant neoplasm of upper-outer quadrant of left breast in female, estrogen receptor positive (HCC) 06/08/2018   Sinusitis 11/15/2017    Past Surgical History:  Procedure Laterality Date   BREAST RECONSTRUCTION WITH PLACEMENT OF TISSUE EXPANDER AND ALLODERM N/A 08/14/2018   Procedure: BREAST RECONSTRUCTION WITH PLACEMENT OF TISSUE EXPANDER AND FLEXHD;  Surgeon: Lowery Estefana RAMAN, DO;  Location: [facility];  Service: Plastics;  Laterality: N/A;   BREAST SURGERY     DILATION AND CURETTAGE OF UTERUS     LASIK     LIPOSUCTION Bilateral 11/15/2018   Procedure: LIPOSUCTION;  Surgeon: Lowery Estefana RAMAN, DO;  Location: [facility];  Service: Plastics;  Laterality: Bilateral;   LIPOSUCTION WITH LIPOFILLING Bilateral 02/22/2019   Procedure: LIPOSUCTION WITH LIPOFILLING;  Surgeon: Lowery Estefana RAMAN, DO;  Location: [facility];  Service: Plastics;  Laterality: Bilateral;  2 hours, please   MASTECTOMY W/ SENTINEL NODE BIOPSY Bilateral 08/14/2018   with breast reconstruction   MASTECTOMY W/ SENTINEL NODE BIOPSY Bilateral 08/14/2018   Procedure: BILATERAL MASTECTOMIES  WITH LEFT SENTINEL LYMPH NODE MAPPING;  Surgeon: Curvin Deward MOULD, MD;  Location: [facility];  Service: General;  Laterality: Bilateral;   REMOVAL OF BILATERAL TISSUE EXPANDERS WITH PLACEMENT OF BILATERAL BREAST IMPLANTS Bilateral 11/15/2018   Procedure: removal of bilateral expanders and placement of bilateral silicone implants.;  Surgeon: Lowery Estefana RAMAN, DO;  Location: [facility];  Service: Plastics;  Laterality: Bilateral;     Current Medications[1]   Objective:   Vitals:   09/18/24 1152  BP: 115/77  Pulse: 70  SpO2: 97%    Physical Exam Vitals reviewed.  Constitutional:      Appearance: Normal appearance.  HENT:     Head: Atraumatic.  Cardiovascular:     Rate and Rhythm: Normal rate.     Pulses: Normal pulses.  Pulmonary:     Effort: Pulmonary effort is normal.  Abdominal:     Palpations: Abdomen is soft.  Skin:    General: Skin is warm.     Capillary Refill: Capillary refill takes less than 2 seconds.  Neurological:     Mental Status: She is alert and oriented to person, place, and time.  Psychiatric:        Mood and Affect: Mood  normal.        Behavior: Behavior normal.        Thought Content: Thought content normal.        Judgment: Judgment normal.     Assessment & Plan:  S/P breast reconstruction, bilateral  Malignant neoplasm of female breast, unspecified estrogen receptor status, unspecified laterality, unspecified site of breast (HCC)  Acquired absence of both breasts  The patient is a candidate for fat filling bilateral.  She would also like a quote for abdominoplasty.  Estefana RAMAN Jinx Gilden, DO    [1]  Current Outpatient Medications:    acetaminophen  (TYLENOL ) 500 MG tablet, Take  1,000 mg by mouth every 6 (six) hours as needed for moderate pain., Disp: , Rfl:    cetirizine (ZYRTEC ALLERGY) 10 MG tablet, Take 10 mg by mouth daily., Disp: , Rfl:    clobetasol (TEMOVATE) 0.05 % external solution, Apply 1 application. topically daily., Disp: , Rfl:    Docusate Calcium (STOOL SOFTENER PO), Take 1 tablet by mouth 2 (two) times daily. , Disp: , Rfl:    escitalopram  (LEXAPRO ) 5 MG tablet, Take 1 tablet (5 mg total) by mouth daily., Disp: 90 tablet, Rfl: 3   estradiol (ESTRACE) 0.1 MG/GM vaginal cream, SMARTSIG:1 Applicator Vaginal Daily, Disp: , Rfl:    FIBER PO, Take by mouth., Disp: , Rfl:    fluticasone  (FLONASE ) 50 MCG/ACT nasal spray, Place into both nostrils daily., Disp: , Rfl:    hydrochlorothiazide  (MICROZIDE ) 12.5 MG capsule, Take 1 capsule (12.5 mg total) by mouth daily., Disp: 90 capsule, Rfl: 1   losartan  (COZAAR ) 25 MG tablet, Take 1 tablet (25 mg total) by mouth daily., Disp: 90 tablet, Rfl: 1   Multiple Vitamins-Minerals (MULTIVITAMIN WOMEN PO), Take 1 capsule by mouth daily., Disp: , Rfl:    Probiotic Product (PRO-BIOTIC BLEND PO), Take 1 tablet by mouth daily. , Disp: , Rfl:    tirzepatide (MOUNJARO) 10 MG/0.5ML Pen, Inject 10 mg into the skin once a week., Disp: , Rfl:   "

## 2024-10-16 ENCOUNTER — Ambulatory Visit: Admitting: Family Medicine

## 2024-11-13 ENCOUNTER — Telehealth: Admitting: Plastic Surgery
